# Patient Record
Sex: Female | Born: 1985
Health system: Southern US, Community
[De-identification: ages and names within clinical notes are randomized; demographics above are authoritative.]

## PROBLEM LIST (undated history)

## (undated) ENCOUNTER — Inpatient Hospital Stay (HOSPITAL_COMMUNITY): Payer: Self-pay

## (undated) DIAGNOSIS — I10 Essential (primary) hypertension: Secondary | ICD-10-CM

## (undated) DIAGNOSIS — Z789 Other specified health status: Secondary | ICD-10-CM

## (undated) DIAGNOSIS — D649 Anemia, unspecified: Secondary | ICD-10-CM

## (undated) HISTORY — DX: Essential (primary) hypertension: I10

## (undated) HISTORY — DX: Anemia, unspecified: D64.9

---

## 2007-10-17 DIAGNOSIS — O4590 Premature separation of placenta, unspecified, unspecified trimester: Secondary | ICD-10-CM

## 2009-08-08 ENCOUNTER — Emergency Department: Payer: Self-pay | Admitting: Emergency Medicine

## 2009-08-16 HISTORY — PX: OTHER SURGICAL HISTORY: SHX169

## 2010-07-27 ENCOUNTER — Other Ambulatory Visit
Admission: RE | Admit: 2010-07-27 | Discharge: 2010-07-27 | Payer: Self-pay | Source: Home / Self Care | Admitting: Obstetrics & Gynecology

## 2010-11-28 ENCOUNTER — Emergency Department (HOSPITAL_COMMUNITY)
Admission: EM | Admit: 2010-11-28 | Discharge: 2010-11-28 | Disposition: A | Discharge status: Home / Self Care | Payer: BC Managed Care – HMO | Attending: Emergency Medicine | Admitting: Emergency Medicine

## 2010-11-28 DIAGNOSIS — H9209 Otalgia, unspecified ear: Secondary | ICD-10-CM | POA: Insufficient documentation

## 2010-11-28 DIAGNOSIS — H6500 Acute serous otitis media, unspecified ear: Secondary | ICD-10-CM | POA: Insufficient documentation

## 2011-01-09 ENCOUNTER — Inpatient Hospital Stay (HOSPITAL_COMMUNITY)
Admission: AD | Admit: 2011-01-09 | Discharge: 2011-01-11 | DRG: 373 | Disposition: A | Discharge status: Home / Self Care | Payer: BC Managed Care – HMO | Source: Ambulatory Visit | Attending: Obstetrics and Gynecology | Admitting: Obstetrics and Gynecology

## 2011-01-09 DIAGNOSIS — O34219 Maternal care for unspecified type scar from previous cesarean delivery: Secondary | ICD-10-CM

## 2011-01-09 LAB — URINALYSIS, ROUTINE W REFLEX MICROSCOPIC
Glucose, UA: NEGATIVE mg/dL
Ketones, ur: NEGATIVE mg/dL
Leukocytes, UA: NEGATIVE
Nitrite: NEGATIVE
Specific Gravity, Urine: >1.03 — ABNORMAL HIGH (ref 1.005–1.030)
pH: 6 (ref 5.0–8.0)

## 2011-01-09 LAB — PROTEIN / CREATININE RATIO, URINE
Creatinine, Urine: 299.98 mg/dL
Protein Creatinine Ratio: 0.66 — ABNORMAL HIGH (ref 0.00–0.15)

## 2011-01-09 LAB — CBC
Hemoglobin: 8.9 g/dL — ABNORMAL LOW (ref 12.0–15.0)
MCH: 20.7 pg — ABNORMAL LOW (ref 26.0–34.0)
MCHC: 31.6 g/dL (ref 30.0–36.0)
RDW: 19.7 % — ABNORMAL HIGH (ref 11.5–15.5)

## 2011-01-09 LAB — COMPREHENSIVE METABOLIC PANEL
ALT: 8 U/L (ref 0–35)
Albumin: 2.6 g/dL — ABNORMAL LOW (ref 3.5–5.2)
BUN: 7 mg/dL (ref 6–23)
Calcium: 9.4 mg/dL (ref 8.4–10.5)
GFR calc Af Amer: >60 mL/min (ref >60)
Glucose, Bld: 83 mg/dL (ref 70–99)
Sodium: 130 mEq/L — ABNORMAL LOW (ref 135–145)
Total Protein: 6.8 g/dL (ref 6.0–8.3)

## 2011-01-09 LAB — RPR: RPR Ser Ql: NONREACTIVE

## 2011-01-09 LAB — URINE MICROSCOPIC-ADD ON

## 2011-01-10 LAB — CBC
MCHC: 31.2 g/dL (ref 30.0–36.0)
RDW: 19.7 % — ABNORMAL HIGH (ref 11.5–15.5)
WBC: 11.7 10*3/uL — ABNORMAL HIGH (ref 4.0–10.5)

## 2012-03-14 LAB — OB RESULTS CONSOLE RPR: RPR: NONREACTIVE

## 2012-03-14 LAB — OB RESULTS CONSOLE ABO/RH

## 2012-03-14 LAB — OB RESULTS CONSOLE ANTIBODY SCREEN: Antibody Screen: NEGATIVE

## 2012-07-12 NOTE — L&D Delivery Note (Signed)
Attestation of Attending Supervision of Advanced Practitioner (CNM/NP): Evaluation and management procedures were performed by the Advanced Practitioner under my supervision and collaboration.  I have reviewed the Advanced Practitioner's note and chart, and I agree with the management and plan.  HARRAWAY-SMITH, Jia Dottavio 9:04 AM     

## 2012-07-12 NOTE — L&D Delivery Note (Signed)
Delivery Note At 8:57 PM a viable female was delivered via Vaginal, Spontaneous Delivery (Presentation: Left Occiput Anterior).  APGAR: 9, 9; weight: pending.   Placenta status: Intact, Spontaneous.  Cord: 3 vessels with the following complications: None.    Anesthesia: Epidural  Episiotomy: None Lacerations: None  Est. Blood Loss (mL): 200  Mom to postpartum.  Baby to nursery-stable.  MERRELL, DAVID, MD Family Medicine Resident PGY-2 10/17/2012, 9:43 PM  I have seen this patient, was present for delivery, and I agree with the above. Cam Hai 12:22 AM 10/18/2012

## 2012-08-11 LAB — OB RESULTS CONSOLE RPR: RPR: NONREACTIVE

## 2012-08-11 LAB — OB RESULTS CONSOLE HGB/HCT, BLOOD
HCT: 33 %
Hemoglobin: 11.1 g/dL

## 2012-09-25 ENCOUNTER — Encounter: Payer: Self-pay | Admitting: *Deleted

## 2012-09-27 ENCOUNTER — Ambulatory Visit (INDEPENDENT_AMBULATORY_CARE_PROVIDER_SITE_OTHER): Payer: BC Managed Care – PPO | Admitting: Obstetrics & Gynecology

## 2012-09-27 VITALS — BP 118/80 | Wt 242.0 lb

## 2012-09-27 DIAGNOSIS — O34219 Maternal care for unspecified type scar from previous cesarean delivery: Secondary | ICD-10-CM

## 2012-09-27 DIAGNOSIS — Z331 Pregnant state, incidental: Secondary | ICD-10-CM

## 2012-09-27 DIAGNOSIS — Z3490 Encounter for supervision of normal pregnancy, unspecified, unspecified trimester: Secondary | ICD-10-CM

## 2012-09-27 DIAGNOSIS — Z1389 Encounter for screening for other disorder: Secondary | ICD-10-CM

## 2012-09-27 LAB — POCT URINALYSIS DIPSTICK
Blood, UA: NEGATIVE
Glucose, UA: NEGATIVE
Leukocytes, UA: NEGATIVE
Nitrite, UA: NEGATIVE

## 2012-09-27 LAB — OB RESULTS CONSOLE GBS: GBS: NEGATIVE

## 2012-09-27 NOTE — Progress Notes (Signed)
Patient reports good fetal movement, denies any bleeding and no rupture of membranes symptoms.  Contractions are mild.

## 2012-09-27 NOTE — Progress Notes (Signed)
Pt c/o a lot of lower abd. Pain and pressure.

## 2012-09-27 NOTE — Patient Instructions (Signed)
Pregnancy - Third Trimester  The third trimester of pregnancy (the last 3 months) is a period of the most rapid growth for you and your baby. The baby approaches a length of 20 inches and a weight of 6 to 10 pounds. The baby is adding on fat and getting ready for life outside your body. While inside, babies have periods of sleeping and waking, suck their thumbs, and hiccups. You can often feel small contractions of the uterus. This is false labor. It is also called Braxton-Hicks contractions. This is like a practice for labor. The usual problems in this stage of pregnancy include more difficulty breathing, swelling of the hands and feet from water retention, and having to urinate more often because of the uterus and baby pressing on your bladder.   PRENATAL EXAMS  · Blood work may continue to be done during prenatal exams. These tests are done to check on your health and the probable health of your baby. Blood work is used to follow your blood levels (hemoglobin). Anemia (low hemoglobin) is common during pregnancy. Iron and vitamins are given to help prevent this. You may also continue to be checked for diabetes. Some of the past blood tests may be done again.  · The size of the uterus is measured during each visit. This makes sure your baby is growing properly according to your pregnancy dates.  · Your blood pressure is checked every prenatal visit. This is to make sure you are not getting toxemia.  · Your urine is checked every prenatal visit for infection, diabetes and protein.  · Your weight is checked at each visit. This is done to make sure gains are happening at the suggested rate and that you and your baby are growing normally.  · Sometimes, an ultrasound is performed to confirm the position and the proper growth and development of the baby. This is a test done that bounces harmless sound waves off the baby so your caregiver can more accurately determine due dates.  · Discuss the type of pain medication and  anesthesia you will have during your labor and delivery.  · Discuss the possibility and anesthesia if a Cesarean Section might be necessary.  · Inform your caregiver if there is any mental or physical violence at home.  Sometimes, a specialized non-stress test, contraction stress test and biophysical profile are done to make sure the baby is not having a problem. Checking the amniotic fluid surrounding the baby is called an amniocentesis. The amniotic fluid is removed by sticking a needle into the belly (abdomen). This is sometimes done near the end of pregnancy if an early delivery is required. In this case, it is done to help make sure the baby's lungs are mature enough for the baby to live outside of the womb. If the lungs are not mature and it is unsafe to deliver the baby, an injection of cortisone medication is given to the mother 1 to 2 days before the delivery. This helps the baby's lungs mature and makes it safer to deliver the baby.  CHANGES OCCURING IN THE THIRD TRIMESTER OF PREGNANCY  Your body goes through many changes during pregnancy. They vary from person to person. Talk to your caregiver about changes you notice and are concerned about.  · During the last trimester, you have probably had an increase in your appetite. It is normal to have cravings for certain foods. This varies from person to person and pregnancy to pregnancy.  · You may begin to   get stretch marks on your hips, abdomen, and breasts. These are normal changes in the body during pregnancy. There are no exercises or medications to take which prevent this change.  · Constipation may be treated with a stool softener or adding bulk to your diet. Drinking lots of fluids, fiber in vegetables, fruits, and whole grains are helpful.  · Exercising is also helpful. If you have been very active up until your pregnancy, most of these activities can be continued during your pregnancy. If you have been less active, it is helpful to start an exercise  program such as walking. Consult your caregiver before starting exercise programs.  · Avoid all smoking, alcohol, un-prescribed drugs, herbs and "street drugs" during your pregnancy. These chemicals affect the formation and growth of the baby. Avoid chemicals throughout the pregnancy to ensure the delivery of a healthy infant.  · Backache, varicose veins and hemorrhoids may develop or get worse.  · You will tire more easily in the third trimester, which is normal.  · The baby's movements may be stronger and more often.  · You may become short of breath easily.  · Your belly button may stick out.  · A yellow discharge may leak from your breasts called colostrum.  · You may have a bloody mucus discharge. This usually occurs a few days to a week before labor begins.  HOME CARE INSTRUCTIONS   · Keep your caregiver's appointments. Follow your caregiver's instructions regarding medication use, exercise, and diet.  · During pregnancy, you are providing food for you and your baby. Continue to eat regular, well-balanced meals. Choose foods such as meat, fish, milk and other low fat dairy products, vegetables, fruits, and whole-grain breads and cereals. Your caregiver will tell you of the ideal weight gain.  · A physical sexual relationship may be continued throughout pregnancy if there are no other problems such as early (premature) leaking of amniotic fluid from the membranes, vaginal bleeding, or belly (abdominal) pain.  · Exercise regularly if there are no restrictions. Check with your caregiver if you are unsure of the safety of your exercises. Greater weight gain will occur in the last 2 trimesters of pregnancy. Exercising helps:  · Control your weight.  · Get you in shape for labor and delivery.  · You lose weight after you deliver.  · Rest a lot with legs elevated, or as needed for leg cramps or low back pain.  · Wear a good support or jogging bra for breast tenderness during pregnancy. This may help if worn during  sleep. Pads or tissues may be used in the bra if you are leaking colostrum.  · Do not use hot tubs, steam rooms, or saunas.  · Wear your seat belt when driving. This protects you and your baby if you are in an accident.  · Avoid raw meat, cat litter boxes and soil used by cats. These carry germs that can cause birth defects in the baby.  · It is easier to loose urine during pregnancy. Tightening up and strengthening the pelvic muscles will help with this problem. You can practice stopping your urination while you are going to the bathroom. These are the same muscles you need to strengthen. It is also the muscles you would use if you were trying to stop from passing gas. You can practice tightening these muscles up 10 times a set and repeating this about 3 times per day. Once you know what muscles to tighten up, do not perform these   exercises during urination. It is more likely to cause an infection by backing up the urine.  · Ask for help if you have financial, counseling or nutritional needs during pregnancy. Your caregiver will be able to offer counseling for these needs as well as refer you for other special needs.  · Make a list of emergency phone numbers and have them available.  · Plan on getting help from family or friends when you go home from the hospital.  · Make a trial run to the hospital.  · Take prenatal classes with the father to understand, practice and ask questions about the labor and delivery.  · Prepare the baby's room/nursery.  · Do not travel out of the city unless it is absolutely necessary and with the advice of your caregiver.  · Wear only low or no heal shoes to have better balance and prevent falling.  MEDICATIONS AND DRUG USE IN PREGNANCY  · Take prenatal vitamins as directed. The vitamin should contain 1 milligram of folic acid. Keep all vitamins out of reach of children. Only a couple vitamins or tablets containing iron may be fatal to a baby or young child when ingested.  · Avoid use  of all medications, including herbs, over-the-counter medications, not prescribed or suggested by your caregiver. Only take over-the-counter or prescription medicines for pain, discomfort, or fever as directed by your caregiver. Do not use aspirin, ibuprofen (Motrin®, Advil®, Nuprin®) or naproxen (Aleve®) unless OK'd by your caregiver.  · Let your caregiver also know about herbs you may be using.  · Alcohol is related to a number of birth defects. This includes fetal alcohol syndrome. All alcohol, in any form, should be avoided completely. Smoking will cause low birth rate and premature babies.  · Street/illegal drugs are very harmful to the baby. They are absolutely forbidden. A baby born to an addicted mother will be addicted at birth. The baby will go through the same withdrawal an adult does.  SEEK MEDICAL CARE IF:  You have any concerns or worries during your pregnancy. It is better to call with your questions if you feel they cannot wait, rather than worry about them.  DECISIONS ABOUT CIRCUMCISION  You may or may not know the sex of your baby. If you know your baby is a boy, it may be time to think about circumcision. Circumcision is the removal of the foreskin of the penis. This is the skin that covers the sensitive end of the penis. There is no proven medical need for this. Often this decision is made on what is popular at the time or based upon religious beliefs and social issues. You can discuss these issues with your caregiver or pediatrician.  SEEK IMMEDIATE MEDICAL CARE IF:   · An unexplained oral temperature above 102° F (38.9° C) develops, or as your caregiver suggests.  · You have leaking of fluid from the vagina (birth canal). If leaking membranes are suspected, take your temperature and tell your caregiver of this when you call.  · There is vaginal spotting, bleeding or passing clots. Tell your caregiver of the amount and how many pads are used.  · You develop a bad smelling vaginal discharge with  a change in the color from clear to white.  · You develop vomiting that lasts more than 24 hours.  · You develop chills or fever.  · You develop shortness of breath.  · You develop burning on urination.  · You loose more than 2 pounds of weight   or gain more than 2 pounds of weight or as suggested by your caregiver.  · You notice sudden swelling of your face, hands, and feet or legs.  · You develop belly (abdominal) pain. Round ligament discomfort is a common non-cancerous (benign) cause of abdominal pain in pregnancy. Your caregiver still must evaluate you.  · You develop a severe headache that does not go away.  · You develop visual problems, blurred or double vision.  · If you have not felt your baby move for more than 1 hour. If you think the baby is not moving as much as usual, eat something with sugar in it and lie down on your left side for an hour. The baby should move at least 4 to 5 times per hour. Call right away if your baby moves less than that.  · You fall, are in a car accident or any kind of trauma.  · There is mental or physical violence at home.  Document Released: 06/22/2001 Document Revised: 09/20/2011 Document Reviewed: 12/25/2008  [employer]® Patient Information ©2013 [employer].

## 2012-09-28 ENCOUNTER — Encounter (HOSPITAL_COMMUNITY): Payer: Self-pay | Admitting: *Deleted

## 2012-09-28 ENCOUNTER — Inpatient Hospital Stay (HOSPITAL_COMMUNITY)
Admission: AD | Admit: 2012-09-28 | Discharge: 2012-09-28 | Disposition: A | Discharge status: Home / Self Care | Payer: BC Managed Care – PPO | Source: Ambulatory Visit | Attending: Obstetrics & Gynecology | Admitting: Obstetrics & Gynecology

## 2012-09-28 ENCOUNTER — Encounter: Payer: Self-pay | Admitting: Obstetrics & Gynecology

## 2012-09-28 DIAGNOSIS — O479 False labor, unspecified: Secondary | ICD-10-CM

## 2012-09-28 DIAGNOSIS — O471 False labor at or after 37 completed weeks of gestation: Secondary | ICD-10-CM

## 2012-09-28 DIAGNOSIS — N390 Urinary tract infection, site not specified: Secondary | ICD-10-CM | POA: Insufficient documentation

## 2012-09-28 DIAGNOSIS — O2390 Unspecified genitourinary tract infection in pregnancy, unspecified trimester: Secondary | ICD-10-CM | POA: Insufficient documentation

## 2012-09-28 HISTORY — DX: Other specified health status: Z78.9

## 2012-09-28 NOTE — MAU Note (Signed)
Pt states she has been having irregular U/C's since last night.  But the vaginal pressure has increased since doctors visit yesterday.  Denies vaginal bleeding.  Good fetal movement.

## 2012-09-28 NOTE — MAU Provider Note (Signed)
  History     CSN: [id number]  Arrival date and time: 09/28/12 1342   First Provider Initiated Contact with Patient 09/28/12 1420      Chief Complaint  Patient presents with  . Labor Eval   HPI 27 y/o [id number] here at 27.2 for a labor check. She has been having braxton hicks contractions for a while which became more regular 1 day ago. She began having different stronger contractions yesterday while she was walking which have continued today but are not regular. She denies vaginal LOF/Discharge/bleeding and decreased fetal movement. She is currently on macrobid for a UTI. She describes that she has had some perineal sensitivity for 2 months, describing that she is just a little tender when she wipes or washes in the shower. She denies headaches, scotoma, chest pain, RUQ pain, dysuria, and swelling.   OB History   Grav Para Term Preterm Abortions TAB SAB Ect Mult Living   3 2 1 1      2       Past Medical History  Diagnosis Date  . Medical history non-contributory     Past Surgical History  Procedure Laterality Date  . Cesarean section    . Left ovary and tube removed  2011    Family History  Problem Relation Age of Onset  . Diabetes Other     relation unspecified  . Hypertension Other     relation unspecified  . Cancer Other     breast and ovarian ca. relation unspecified    History  Substance Use Topics  . Smoking status: Never Smoker   . Smokeless tobacco: Never Used  . Alcohol Use: No    Allergies: No Known Allergies  Prescriptions prior to admission  Medication Sig Dispense Refill  . Prenatal Vitamins (DIS) TABS Take 1 tablet by mouth daily.        ROS per HPI Physical Exam   Blood pressure 135/81, pulse 101, temperature 98.1 F (36.7 C), temperature source Oral, resp. rate 18, height 5\' 5"  (1.651 m), weight 110.224 kg (243 lb), last menstrual period 01/01/2012, SpO2 100.00%.  Physical Exam  Gen: NAD, alert, cooperative with exam HEENT: NCAT CV:  RRR, good S1/S2, no murmur Resp: CTABL, no wheezes, non-labored Abd: Soft, non tender, pregnant abdomen Ext: No edema Neuro: Alert and oriented, No gross deficits  Dilation: 2.5 Effacement (%): 30 Cervical Position: Posterior Station: -2 Presentation: Vertex Exam by:: Merrilee Seashore, MD  Dilation: 2.5 Effacement (%): 30 Cervical Position: Posterior Station: -2 Presentation: Vertex Exam by::  (L. Paschal, RN)  FHT: baseline 140, moderate variability, accels present, no decels Toco: occasional, irregular 10+ minutes apart MAU Course  Procedures  No results found for this or any previous visit (from the past 24 hour(s)).   Assessment and Plan  27 y/o [id number] here at 27.2 here for a loabor check - No cervical change after 2+ hours, contractions very irregular - Signs of labor reviewed - [facility], return for labor or ROM  Kevin Fenton 09/28/2012, 2:29 PM  Evaluation and management procedures were performed by Resident physician under my supervision/collaboration. Chart reviewed, patient examined by me and I agree with management and plan.

## 2012-10-02 ENCOUNTER — Encounter (HOSPITAL_COMMUNITY): Payer: Self-pay | Admitting: *Deleted

## 2012-10-02 ENCOUNTER — Inpatient Hospital Stay (HOSPITAL_COMMUNITY)
Admission: AD | Admit: 2012-10-02 | Discharge: 2012-10-02 | Disposition: A | Discharge status: Home / Self Care | Payer: BC Managed Care – PPO | Source: Ambulatory Visit | Attending: Obstetrics and Gynecology | Admitting: Obstetrics and Gynecology

## 2012-10-02 DIAGNOSIS — O471 False labor at or after 37 completed weeks of gestation: Secondary | ICD-10-CM

## 2012-10-02 DIAGNOSIS — O479 False labor, unspecified: Secondary | ICD-10-CM | POA: Insufficient documentation

## 2012-10-02 DIAGNOSIS — O34219 Maternal care for unspecified type scar from previous cesarean delivery: Secondary | ICD-10-CM | POA: Insufficient documentation

## 2012-10-02 NOTE — MAU Note (Signed)
Pt states the contractions were 3-5 min apart for the past hr.  Denies any bleeding or leaking

## 2012-10-02 NOTE — MAU Provider Note (Signed)
  History     CSN: [id number]  Arrival date and time: 10/02/12 0106   None     No chief complaint on file.  HPI 27 y.o. [id number] at [redacted]w[redacted]d presenting with ctx since 11 PM, worsening in intensity about an hour later. No loss of fluid or bleeding, baby moving well. Better when lying down.   Gets prenatal care at [facility], last seen 3/19. Seen here in MAU on 3/20. Was dilated to 2.5-3 at that time. No complications of this pregnancy.   1st delivery placental abruption at 33 weeks - emergency c-section. Second pregnancy at 40.4 days.  OB History   Grav Para Term Preterm Abortions TAB SAB Ect Mult Living   3 2 1 1      2       Past Medical History  Diagnosis Date  . Medical history non-contributory     Past Surgical History  Procedure Laterality Date  . Cesarean section    . Left ovary and tube removed  2011  Wisdom teeth  Family History  Problem Relation Age of Onset  . Diabetes Other     relation unspecified  . Hypertension Other     relation unspecified  . Cancer Other     breast and ovarian ca. relation unspecified    History  Substance Use Topics  . Smoking status: Never Smoker   . Smokeless tobacco: Never Used  . Alcohol Use: No    Allergies: No Known Allergies  Prescriptions prior to admission  Medication Sig Dispense Refill  . nitrofurantoin, macrocrystal-monohydrate, (MACROBID) 100 MG capsule Take 100 mg by mouth 2 (two) times daily. X 5 days. Started on 09/25/12.        Review of Systems  Constitutional: Negative for fever and chills.  Eyes: Negative for blurred vision.  Gastrointestinal: Negative for nausea, vomiting, diarrhea and constipation.  Genitourinary: Negative for dysuria.  Neurological: Negative for dizziness and headaches.   Physical Exam   Blood pressure 118/73, pulse 96, temperature 97.7 F (36.5 C), height 5\' 5"  (1.651 m), weight 111.585 kg (246 lb), last menstrual period 01/01/2012.  Physical Exam  Constitutional: She is  oriented to person, place, and time. She appears well-developed and well-nourished.  HENT:  Head: Normocephalic and atraumatic.  Eyes: Conjunctivae and EOM are normal.  Neck: Normal range of motion. Neck supple.  Cardiovascular: Normal rate, regular rhythm and normal heart sounds.   Respiratory: Effort normal and breath sounds normal. No respiratory distress.  GI: Soft. Bowel sounds are normal. There is no tenderness. There is no rebound and no guarding.  Genitourinary:  Normal external genitalia, normal vagina, minimal white discharge. No CMT. Cervix 2.5/80/-3  Musculoskeletal: Normal range of motion. She exhibits edema (mild lower extremity). She exhibits no tenderness.  Neurological: She is alert and oriented to person, place, and time.  Skin: Skin is warm and dry.  Psychiatric: She has a normal mood and affect.    FHTs:  135, mod var, accels present, occasional early decel   MAU Course  Procedures  After 2 hours, cervix rechecked:  unchanged  Assessment and Plan  27 y.o. [id number] at [redacted]w[redacted]d with contractions - No cervical change. Not in labor. - Labor precautions reviewed - FHTs reactive  Napoleon Form 10/02/2012, 2:20 AM

## 2012-10-03 NOTE — MAU Provider Note (Signed)
Attestation of Attending Supervision of Advanced Practitioner: Evaluation and management procedures were performed by the PA/NP/CNM/OB Fellow under my supervision/collaboration. Chart reviewed and agree with management and plan.  Melony Tenpas V 10/03/2012 9:04 PM   

## 2012-10-05 ENCOUNTER — Encounter: Payer: BC Managed Care – PPO | Admitting: Advanced Practice Midwife

## 2012-10-05 ENCOUNTER — Ambulatory Visit (INDEPENDENT_AMBULATORY_CARE_PROVIDER_SITE_OTHER): Payer: BC Managed Care – PPO | Admitting: Obstetrics & Gynecology

## 2012-10-05 VITALS — BP 110/82 | Wt 242.0 lb

## 2012-10-05 DIAGNOSIS — Z1389 Encounter for screening for other disorder: Secondary | ICD-10-CM

## 2012-10-05 DIAGNOSIS — Z3483 Encounter for supervision of other normal pregnancy, third trimester: Secondary | ICD-10-CM

## 2012-10-05 DIAGNOSIS — Z331 Pregnant state, incidental: Secondary | ICD-10-CM

## 2012-10-05 DIAGNOSIS — O34219 Maternal care for unspecified type scar from previous cesarean delivery: Secondary | ICD-10-CM

## 2012-10-05 LAB — POCT URINALYSIS DIPSTICK: Protein, UA: NEGATIVE

## 2012-10-05 NOTE — Progress Notes (Signed)
Pt reports irregular uterine contractions, no bleeding no gushes of fluid . No edema abdominal exam benign

## 2012-10-05 NOTE — Patient Instructions (Signed)
Contraception Choices  Contraception (birth control) is the use of any methods or devices to prevent pregnancy. Below are some methods to help avoid pregnancy.  HORMONAL METHODS   · Contraceptive implant. This is a thin, plastic tube containing progesterone hormone. It does not contain estrogen hormone. Your caregiver inserts the tube in the inner part of the upper arm. The tube can remain in place for up to 3 years. After 3 years, the implant must be removed. The implant prevents the ovaries from releasing an egg (ovulation), thickens the cervical mucus which prevents sperm from entering the uterus, and thins the lining of the inside of the uterus.  · Progesterone-only injections. These injections are given every 3 months by your caregiver to prevent pregnancy. This synthetic progesterone hormone stops the ovaries from releasing eggs. It also thickens cervical mucus and changes the uterine lining. This makes it harder for sperm to survive in the uterus.  · Birth control pills. These pills contain estrogen and progesterone hormone. They work by stopping the egg from forming in the ovary (ovulation). Birth control pills are prescribed by a caregiver. Birth control pills can also be used to treat heavy periods.  · Minipill. This type of birth control pill contains only the progesterone hormone. They are taken every day of each month and must be prescribed by your caregiver.  · Birth control patch. The patch contains hormones similar to those in birth control pills. It must be changed once a week and is prescribed by a caregiver.  · Vaginal ring. The ring contains hormones similar to those in birth control pills. It is left in the vagina for 3 weeks, removed for 1 week, and then a new one is put back in place. The patient must be comfortable inserting and removing the ring from the vagina. A caregiver's prescription is necessary.  · Emergency contraception. Emergency contraceptives prevent pregnancy after unprotected  sexual intercourse. This pill can be taken right after sex or up to 5 days after unprotected sex. It is most effective the sooner you take the pills after having sexual intercourse. Emergency contraceptive pills are available without a prescription. Check with your pharmacist. Do not use emergency contraception as your only form of birth control.  BARRIER METHODS   · Female condom. This is a thin sheath (latex or rubber) that is worn over the penis during sexual intercourse. It can be used with spermicide to increase effectiveness.  · Female condom. This is a soft, loose-fitting sheath that is put into the vagina before sexual intercourse.  · Diaphragm. This is a soft, latex, dome-shaped barrier that must be fitted by a caregiver. It is inserted into the vagina, along with a spermicidal jelly. It is inserted before intercourse. The diaphragm should be left in the vagina for 6 to 8 hours after intercourse.  · Cervical cap. This is a round, soft, latex or plastic cup that fits over the cervix and must be fitted by a caregiver. The cap can be left in place for up to 48 hours after intercourse.  · Sponge. This is a soft, circular piece of polyurethane foam. The sponge has spermicide in it. It is inserted into the vagina after wetting it and before sexual intercourse.  · Spermicides. These are chemicals that kill or block sperm from entering the cervix and uterus. They come in the form of creams, jellies, suppositories, foam, or tablets. They do not require a prescription. They are inserted into the vagina with an applicator before having sexual intercourse.   The process must be repeated every time you have sexual intercourse.  INTRAUTERINE CONTRACEPTION  · Intrauterine device (IUD). This is a T-shaped device that is put in a woman's uterus during a menstrual period to prevent pregnancy. There are 2 types:  · Copper IUD. This type of IUD is wrapped in copper wire and is placed inside the uterus. Copper makes the uterus and  fallopian tubes produce a fluid that kills sperm. It can stay in place for 10 years.  · Hormone IUD. This type of IUD contains the hormone progestin (synthetic progesterone). The hormone thickens the cervical mucus and prevents sperm from entering the uterus, and it also thins the uterine lining to prevent implantation of a fertilized egg. The hormone can weaken or kill the sperm that get into the uterus. It can stay in place for 5 years.  PERMANENT METHODS OF CONTRACEPTION  · Female tubal ligation. This is when the woman's fallopian tubes are surgically sealed, tied, or blocked to prevent the egg from traveling to the uterus.  · Female sterilization. This is when the female has the tubes that carry sperm tied off (vasectomy). This blocks sperm from entering the vagina during sexual intercourse. After the procedure, the man can still ejaculate fluid (semen).  NATURAL PLANNING METHODS  · Natural family planning. This is not having sexual intercourse or using a barrier method (condom, diaphragm, cervical cap) on days the woman could become pregnant.  · Calendar method. This is keeping track of the length of each menstrual cycle and identifying when you are fertile.  · Ovulation method. This is avoiding sexual intercourse during ovulation.  · Symptothermal method. This is avoiding sexual intercourse during ovulation, using a thermometer and ovulation symptoms.  · Post-ovulation method. This is timing sexual intercourse after you have ovulated.  Regardless of which type or method of contraception you choose, it is important that you use condoms to protect against the transmission of sexually transmitted diseases (STDs). Talk with your caregiver about which form of contraception is most appropriate for you.  Document Released: 06/28/2005 Document Revised: 09/20/2011 Document Reviewed: 11/04/2010  [employer]® Patient Information ©2013 [employer].

## 2012-10-12 ENCOUNTER — Encounter: Payer: BC Managed Care – PPO | Admitting: Obstetrics & Gynecology

## 2012-10-13 ENCOUNTER — Telehealth (HOSPITAL_COMMUNITY): Payer: Self-pay | Admitting: *Deleted

## 2012-10-13 NOTE — Telephone Encounter (Signed)
Preadmission screen  

## 2012-10-16 ENCOUNTER — Ambulatory Visit (INDEPENDENT_AMBULATORY_CARE_PROVIDER_SITE_OTHER): Payer: BC Managed Care – PPO | Admitting: Women's Health

## 2012-10-16 ENCOUNTER — Telehealth (HOSPITAL_COMMUNITY): Payer: Self-pay | Admitting: *Deleted

## 2012-10-16 ENCOUNTER — Encounter: Payer: Self-pay | Admitting: Women's Health

## 2012-10-16 VITALS — BP 120/70 | Wt 249.0 lb

## 2012-10-16 DIAGNOSIS — Z1389 Encounter for screening for other disorder: Secondary | ICD-10-CM

## 2012-10-16 DIAGNOSIS — Z3480 Encounter for supervision of other normal pregnancy, unspecified trimester: Secondary | ICD-10-CM | POA: Insufficient documentation

## 2012-10-16 DIAGNOSIS — Z331 Pregnant state, incidental: Secondary | ICD-10-CM

## 2012-10-16 DIAGNOSIS — Z3483 Encounter for supervision of other normal pregnancy, third trimester: Secondary | ICD-10-CM

## 2012-10-16 DIAGNOSIS — O34219 Maternal care for unspecified type scar from previous cesarean delivery: Secondary | ICD-10-CM

## 2012-10-16 LAB — POCT URINALYSIS DIPSTICK
Ketones, UA: NEGATIVE
Protein, UA: NEGATIVE

## 2012-10-16 NOTE — Telephone Encounter (Signed)
Preadmission screen  

## 2012-10-16 NOTE — Assessment & Plan Note (Addendum)
Wants VBAC, has had 1 previous successful VBAC. Consent signed: 07/04/12

## 2012-10-16 NOTE — Progress Notes (Signed)
Reports good fm and irregular uc's. Denies lof or vb.  Requests membranes be swept- discussed r/b- pt decided to proceed. Membranes swept.  Reviewed labor s/s, when to go to hospital. Still desires VBAC. IOL scheduled for 10/24/12 @ 0700 if still pregnant.  Begin 2x/wk antenatal testing this Thursday. Marge Duncans 10/16/2012 10:17 AM

## 2012-10-16 NOTE — Patient Instructions (Addendum)
Vaginal Birth After Cesarean Delivery Vaginal birth after Cesarean delivery (VBAC) is giving birth vaginally after previously delivering a baby by a cesarean. In the past, if a woman had a Cesarean delivery, all births afterwards would be done by Cesarean delivery. This is no longer true. It can be safe for the mother to try a vaginal delivery after having a Cesarean. The final decision to have a VBAC or repeat Cesarean delivery should be between the patient and her caregiver. The risks and benefits can be discussed relative to the reason for, and the type of the previous Cesarean delivery. WOMEN WHO PLAN TO HAVE A VBAC SHOULD CHECK WITH THEIR DOCTOR TO BE SURE THAT:  The previous Cesarean was done with a low transverse uterine incision (not a vertical classical incision).  The birth canal is big enough for the baby.  There were no other operations on the uterus.  They will have an electronic fetal monitor (EFM) on at all times during labor.  An operating room would be available and ready in case an emergency Cesarean is needed.  A doctor and surgical nursing staff would be available at all times during labor to be ready to do an emergency Cesarean if necessary.  An anesthesiologist would be present in case an emergency Cesarean is needed.  The nursery is prepared and has adequate personnel and necessary equipment available to care for the baby in case of an emergency Cesarean. BENEFITS OF VBAC:  Shorter stay in the hospital.  Lower delivery, nursery and hospital costs.  Less blood loss and need for blood transfusions.  Less fever and discomfort from major surgery.  Lower risk of blood clots.  Lower risk of infection.  Shorter recovery after going home.  Lower risk of other surgical complications, such as opening of the incision or hernia in the incision.  Decreased risk of injury to other organs.  Decreased risk for having to remove the uterus (hysterectomy).  Decreased risk  for the placenta to completely or partially cover the opening of the uterus (placenta previa) with a future pregnancy.  Ability to have a larger family if desired. RISKS OF A VBAC:  Rupture of the uterus.  Having to remove the uterus (hysterectomy) if it ruptures.  All the complications of major surgery and/or injury to other organs.  Excessive bleeding, blood clots and infection.  Lower Apgar scores (method to evaluate the newborn based on appearance, pulse, grimace, activity, and respiration) and more risks to the baby.  There is a higher risk of uterine rupture if you induce or augment labor.  There is a higher risk of uterine rupture if you use medications to ripen the cervix. VBAC SHOULD NOT BE DONE IF:  The previous Cesarean was done with a vertical (classical) or T-shaped incision, or you do not know what kind of an incision was made.  You had a ruptured uterus.  You had surgery on your uterus.  You have medical or obstetrical problems.  There are problems with the baby.  There were two previous Cesarean deliveries and no vaginal deliveries. OTHER FACTS TO KNOW ABOUT VBAC:  It is safe to have an epidural anesthetic with VBAC.  It is safe to turn the baby from a breech position (attempt an external cephalic version).  It is safe to try a VBAC with twins.  Pregnancies later than 40 weeks have not been successful with VBAC.  There is an increased failure rate of a VBAC in obese pregnant women.  There is  an increased failure rate with VABC if the baby weighs 8.8 pounds (4000 grams) or more.  There is an increased failure rate if the time between the Cesarean and VBAC is less than 19 months.  There is an increased failure rate if pre-eclampsia is present (high blood pressure, protein in the urine and swelling of face and extremities).  VBAC is very successful if there was a previous vaginal birth.  VBAC is very successful when the labor starts spontaneously before  the due date.  Delivery of VBAC is similar to having a normal spontaneous vaginal delivery. It is important to discuss VBAC with your caregiver early in the pregnancy so you can understand the risks, benefits and options. It will give you time to decide what is best in your particular case relevant to the reason for your previous Cesarean delivery. It should be understood that medical changes in the mother or pregnancy may occur during the pregnancy, which make it necessary to change you or your caregiver's initial decision. The counseling, concerns and decisions should be documented in the medical record and signed by all parties. Document Released: 12/19/2006 Document Revised: 09/20/2011 Document Reviewed: 08/09/2008 [facility] Patient Information 2013 [place], [place]. Labor Induction  Most women go into labor on their own between 46 and 42 weeks of the pregnancy. When this does not happen or when there is a medical need, medicine or other methods may be used to induce labor. Labor induction causes a pregnant woman's uterus to contract. It also causes the cervix to soften (ripen), open (dilate), and thin out (efface). Usually, labor is not induced before 39 weeks of the pregnancy unless there is a problem with the baby or mother. Whether your labor will be induced depends on a number of factors, including the following:  The medical condition of you and the baby.  How many weeks along you are.  The status of baby's lung maturity.  The condition of the cervix.  The position of the baby. REASONS FOR LABOR INDUCTION  The health of the baby or mother is at risk.  The pregnancy is overdue by 1 week or more.  The water breaks but labor does not start on its own.  The mother has a health condition or serious illness such as high blood pressure, infection, placental abruption, or diabetes.  The amniotic fluid amounts are low around the baby.  The baby is distressed. REASONS TO NOT INDUCE  LABOR Labor induction may not be a good idea if:  It is shown that your baby does not tolerate labor.  An induction is just more convenient.  You want the baby to be born on a certain date, like a holiday.  You have had previous surgeries on your uterus, such as a myomectomy or the removal of fibroids.  Your placenta lies very low in the uterus and blocks the opening of the cervix (placenta previa).  Your baby is not in a head down position.  The umbilical cord drops down into the birth canal in front of the baby. This could cut off the baby's blood and oxygen supply.  You have had a previous cesarean delivery.  There areunusual circumstances, such as the baby being extremely premature. RISKS AND COMPLICATIONS Problems may occur in the process of induction and plans may need to be modified as a situation unfolds. Some of the risks of induction include:  Change in fetal heart rate, such as too high, too low, or erratic.  Risk of fetal distress.  Risk of infection to mother and baby.  Increased chance of having a cesarean delivery.  The rare, but increased chance that the placenta will separate from the uterus (abruption).  Uterine rupture (very rare). When induction is needed for medical reasons, the benefits of induction may outweigh the risks. BEFORE THE PROCEDURE Your caregiver will check your cervix and the baby's position. This will help your caregiver decide if you are far enough along for an induction to work. PROCEDURE Several methods of labor induction may be used, such as:   Taking prostaglandin medicine to dilate and ripen the cervix. The medicine will also start contractions. It can be taken by mouth or by inserting a suppository into the vagina.  A thin tube (catheter) with a balloon on the end may be inserted into your vagina to dilate the cervix. Once inserted, the balloon expands with water, which causes the cervix to open.  Striping the membranes. Your  caregiver inserts a finger between the cervix and membranes, which causes the cervix to be stretched and may cause the uterus to contract. This is often done during an office visit. You will be sent home to wait for the contractions to begin. You will then come in for an induction.  Breaking the water. Your caregiver will make a hole in the amniotic sac using a small instrument. Once the amniotic sac breaks, contractions should begin. This may still take hours to see an effect.  Taking medicine to trigger or strengthen contractions. This medicine is given intravenously through a tube in your arm. All of the methods of induction, besides stripping the membranes, will be done in the hospital. Induction is done in the hospital so that you and the baby can be carefully monitored. AFTER THE PROCEDURE Some inductions can take up to 2 or 3 days. Depending on the cervix, it usually takes less time. It takes longer when you are induced early in the pregnancy or if this is your first pregnancy. If a mother is still pregnant and the induction has been going on for 2 to 3 days, either the mother will be sent home or a cesarean delivery will be needed. Document Released: 11/17/2006 Document Revised: 09/20/2011 Document Reviewed: 05/03/2011 [facility] Patient Information 2013 [place], [place].

## 2012-10-17 ENCOUNTER — Inpatient Hospital Stay (HOSPITAL_COMMUNITY)
Admission: AD | Admit: 2012-10-17 | Discharge: 2012-10-19 | DRG: 373 | Disposition: A | Discharge status: Home / Self Care | Payer: BC Managed Care – PPO | Source: Ambulatory Visit | Attending: Obstetrics & Gynecology | Admitting: Obstetrics & Gynecology

## 2012-10-17 ENCOUNTER — Encounter (HOSPITAL_COMMUNITY): Payer: Self-pay | Admitting: Anesthesiology

## 2012-10-17 ENCOUNTER — Inpatient Hospital Stay (HOSPITAL_COMMUNITY): Payer: BC Managed Care – PPO | Admitting: Anesthesiology

## 2012-10-17 ENCOUNTER — Encounter (HOSPITAL_COMMUNITY): Payer: Self-pay | Admitting: *Deleted

## 2012-10-17 DIAGNOSIS — N390 Urinary tract infection, site not specified: Secondary | ICD-10-CM

## 2012-10-17 DIAGNOSIS — O34219 Maternal care for unspecified type scar from previous cesarean delivery: Secondary | ICD-10-CM

## 2012-10-17 DIAGNOSIS — O2390 Unspecified genitourinary tract infection in pregnancy, unspecified trimester: Secondary | ICD-10-CM

## 2012-10-17 DIAGNOSIS — O479 False labor, unspecified: Secondary | ICD-10-CM

## 2012-10-17 LAB — CBC
MCH: 23.3 pg — ABNORMAL LOW (ref 26.0–34.0)
MCV: 71.4 fL — ABNORMAL LOW (ref 78.0–100.0)
Platelets: 257 10*3/uL (ref 150–400)
RDW: 17.4 % — ABNORMAL HIGH (ref 11.5–15.5)

## 2012-10-17 LAB — RPR: RPR Ser Ql: NONREACTIVE

## 2012-10-17 MED ORDER — PHENYLEPHRINE 40 MCG/ML (10ML) SYRINGE FOR IV PUSH (FOR BLOOD PRESSURE SUPPORT)
80.0000 ug | PREFILLED_SYRINGE | INTRAVENOUS | Status: DC | PRN
  Filled 2012-10-17: qty 2

## 2012-10-17 MED ORDER — LACTATED RINGERS IV SOLN
500.0000 mL | INTRAVENOUS | Status: DC | PRN
  Administered 2012-10-17: 500 mL via INTRAVENOUS

## 2012-10-17 MED ORDER — OXYTOCIN 40 UNITS IN LACTATED RINGERS INFUSION - SIMPLE MED
1.0000 m[IU]/min | INTRAVENOUS | Status: DC
  Administered 2012-10-17: 1 m[IU]/min via INTRAVENOUS

## 2012-10-17 MED ORDER — LIDOCAINE HCL (PF) 1 % IJ SOLN
30.0000 mL | INTRAMUSCULAR | Status: DC | PRN
  Filled 2012-10-17 (×2): qty 30

## 2012-10-17 MED ORDER — CITRIC ACID-SODIUM CITRATE 334-500 MG/5ML PO SOLN: 30.0000 mL | ORAL | Status: DC | PRN

## 2012-10-17 MED ORDER — ACETAMINOPHEN 325 MG PO TABS: 650.0000 mg | ORAL_TABLET | ORAL | Status: DC | PRN

## 2012-10-17 MED ORDER — OXYCODONE-ACETAMINOPHEN 5-325 MG PO TABS: 1.0000 | ORAL_TABLET | ORAL | Status: DC | PRN

## 2012-10-17 MED ORDER — FENTANYL CITRATE 0.05 MG/ML IJ SOLN: 100.0000 ug | Freq: Once | INTRAMUSCULAR | Status: DC

## 2012-10-17 MED ORDER — DIPHENHYDRAMINE HCL 50 MG/ML IJ SOLN
12.5000 mg | INTRAMUSCULAR | Status: DC | PRN
  Administered 2012-10-17 (×2): 12.5 mg via INTRAVENOUS
  Filled 2012-10-17 (×2): qty 1

## 2012-10-17 MED ORDER — NALBUPHINE SYRINGE 5 MG/0.5 ML
5.0000 mg | INJECTION | INTRAMUSCULAR | Status: DC | PRN
  Administered 2012-10-17: 5 mg via INTRAVENOUS
  Filled 2012-10-17 (×2): qty 0.5

## 2012-10-17 MED ORDER — FENTANYL 2.5 MCG/ML BUPIVACAINE 1/10 % EPIDURAL INFUSION (WH - ANES)
INTRAMUSCULAR | Status: DC | PRN
  Administered 2012-10-17: 14 mL/h via EPIDURAL

## 2012-10-17 MED ORDER — IBUPROFEN 600 MG PO TABS: 600.0000 mg | ORAL_TABLET | Freq: Four times a day (QID) | ORAL | Status: DC | PRN

## 2012-10-17 MED ORDER — TERBUTALINE SULFATE 1 MG/ML IJ SOLN: 0.2500 mg | Freq: Once | INTRAMUSCULAR | Status: AC | PRN

## 2012-10-17 MED ORDER — EPHEDRINE 5 MG/ML INJ
10.0000 mg | INTRAVENOUS | Status: DC | PRN
  Filled 2012-10-17: qty 2

## 2012-10-17 MED ORDER — LIDOCAINE HCL (PF) 1 % IJ SOLN
INTRAMUSCULAR | Status: DC | PRN
  Administered 2012-10-17 (×2): 4 mL

## 2012-10-17 MED ORDER — EPHEDRINE 5 MG/ML INJ
10.0000 mg | INTRAVENOUS | Status: DC | PRN
  Filled 2012-10-17: qty 2
  Filled 2012-10-17: qty 4

## 2012-10-17 MED ORDER — PHENYLEPHRINE 40 MCG/ML (10ML) SYRINGE FOR IV PUSH (FOR BLOOD PRESSURE SUPPORT)
80.0000 ug | PREFILLED_SYRINGE | INTRAVENOUS | Status: DC | PRN
  Filled 2012-10-17: qty 2
  Filled 2012-10-17: qty 5

## 2012-10-17 MED ORDER — FENTANYL CITRATE 0.05 MG/ML IJ SOLN: 100.0000 ug | INTRAMUSCULAR | Status: DC | PRN

## 2012-10-17 MED ORDER — OXYTOCIN 40 UNITS IN LACTATED RINGERS INFUSION - SIMPLE MED
62.5000 mL/h | INTRAVENOUS | Status: DC
  Filled 2012-10-17: qty 1000

## 2012-10-17 MED ORDER — LACTATED RINGERS IV SOLN
INTRAVENOUS | Status: DC
  Administered 2012-10-17 (×2): via INTRAVENOUS

## 2012-10-17 MED ORDER — LACTATED RINGERS IV SOLN: 500.0000 mL | Freq: Once | INTRAVENOUS | Status: DC

## 2012-10-17 MED ORDER — ONDANSETRON HCL 4 MG/2ML IJ SOLN: 4.0000 mg | Freq: Four times a day (QID) | INTRAMUSCULAR | Status: DC | PRN

## 2012-10-17 MED ORDER — OXYTOCIN BOLUS FROM INFUSION
500.0000 mL | INTRAVENOUS | Status: DC
  Administered 2012-10-17: 500 mL via INTRAVENOUS

## 2012-10-17 MED ORDER — FENTANYL 2.5 MCG/ML BUPIVACAINE 1/10 % EPIDURAL INFUSION (WH - ANES)
14.0000 mL/h | INTRAMUSCULAR | Status: DC | PRN
  Filled 2012-10-17: qty 125

## 2012-10-17 MED ORDER — FENTANYL 2.5 MCG/ML BUPIVACAINE 1/10 % EPIDURAL INFUSION (WH - ANES): 14.0000 mL/h | INTRAMUSCULAR | Status: DC | PRN

## 2012-10-17 NOTE — H&P (Addendum)
I was present for the exam and agree with above. No further leaking.   [place], [place] 10/17/2012 4:04 PM

## 2012-10-17 NOTE — Progress Notes (Signed)
Kim Duran is a 27 y.o. G3P1102 at [redacted]w[redacted]d.   Subjective: Comfortable w/ epidural. Reports generalized itching not resolving w/ Benadryl. No SOB and swelling of tongue or throat.   Objective: BP 121/73  Pulse 78  Temp(Src) 98.1 F (36.7 C) (Oral)  Resp 20  Ht 5\' 3"  (1.6 m)  Wt 111.585 kg (246 lb)  BMI 43.59 kg/m2  SpO2 100%  LMP 01/01/2012     FHT:  FHR: 140 bpm, variability: moderate,  accelerations:  Present,  decelerations:  Absent UC:   regular, every 4-5 minutes, moderate. Pitocin 2 milliUnits /min SVE:   Dilation: 5 Effacement (%): 80 Station: -1 Exam by::Brittany Pollie Meyer, MD IUPC placed w/out difficulty  Labs: Lab Results  Component Value Date   WBC 10.0 10/17/2012   HGB 10.4* 10/17/2012   HCT 31.9* 10/17/2012   MCV 71.4* 10/17/2012   PLT 257 10/17/2012    Assessment / Plan: Augmentation of labor, progressing well  Labor: Progressing slowly. Will increase pitocin to acheive MVUs of 180-240. Preeclampsia:  NA Fetal Wellbeing:  Category I Pain Control:  Epidural I/D:  n/a Anticipated MOD:  NSVD Nubain for itching.  Kim Duran 10/17/2012, 6:31 PM

## 2012-10-17 NOTE — Anesthesia Preprocedure Evaluation (Addendum)
Anesthesia Evaluation  Patient identified by MRN, date of birth, ID band Patient awake    Reviewed: Allergy & Precautions, H&P , Patient's Chart, lab work & pertinent test results  Airway Mallampati: III TM Distance: >3 FB Neck ROM: full    Dental no notable dental hx. (+) Teeth Intact   Pulmonary neg pulmonary ROS,  breath sounds clear to auscultation  Pulmonary exam normal       Cardiovascular negative cardio ROS  Rhythm:regular Rate:Normal     Neuro/Psych negative neurological ROS  negative psych ROS   GI/Hepatic negative GI ROS, Neg liver ROS,   Endo/Other  Morbid obesity  Renal/GU negative Renal ROS  negative genitourinary   Musculoskeletal   Abdominal Normal abdominal exam  (+)   Peds  Hematology negative hematology ROS (+)   Anesthesia Other Findings   Reproductive/Obstetrics (+) Pregnancy Previous C/Section with 1st baby Successful VBAC with 2nd baby                           Anesthesia Physical Anesthesia Plan  ASA: III  Anesthesia Plan: Epidural   Post-op Pain Management:    Induction:   Airway Management Planned:   Additional Equipment:   Intra-op Plan:   Post-operative Plan:   Informed Consent: I have reviewed the patients History and Physical, chart, labs and discussed the procedure including the risks, benefits and alternatives for the proposed anesthesia with the patient or authorized representative who has indicated his/her understanding and acceptance.     Plan Discussed with: Anesthesiologist  Anesthesia Plan Comments:         Anesthesia Quick Evaluation

## 2012-10-17 NOTE — H&P (Signed)
Kim Duran is a 27 y.o. G3P1102 at [redacted]w[redacted]d based on a [redacted]w[redacted]d ultrasound who presents for painful contractions. Reports overnight she began having strong contractions. Yesterday her membranes were stripped and then last night her mucous plug passed. Had a little bit of spotting with after the mucous plug. Is unsure if she has had any fluid leaking, she thought she might have when she was in the shower. Last felt fetal movement one minute ago.  She is having a female baby, who will be named "Kim Duran." Denies having any problems during this pregnancy. She has gotten her prenatal care through [facility]. PNC started at [redacted] weeks gestation. Plans to breast and bottle feed after delivery. Is unsure about contraception but she does plan to have children again in the future. Pediatrician will be [facility].  Pt has hx of c/s with her first pregnancy and desires TOLAC. Hx of one successful VBAC. Consent is scanned into electronic medical record.   History OB History   Grav Para Term Preterm Abortions TAB SAB Ect Mult Living   3 2 1 1      2      Her first pregnancy was in 2009 and was complicated by placental abruption at 33weeks - she had an emergency cesarean delivery at [facility]. She also had early contractions during this pregnancy.  Her second pregnancy was a successful VBAC without complications. That baby weighed <8 lbs.  Past Medical History  Diagnosis Date  . Medical history non-contributory    Past Surgical History  Procedure Laterality Date  . Cesarean section    . Left ovary and tube removed  2011   Family History: family history includes Cancer in her other; Diabetes in her other; and Hypertension in her other. Social History: denies alcohol, tobacco, drug use  Medications: just finished course of macrobid for UTI, also takes prenatal vitamins sometimes  Allergies: denies any food or drug allergies  Prenatal Transfer Tool  Maternal  Diabetes: No Genetic Screening: Normal Maternal Ultrasounds/Referrals: Normal Fetal Ultrasounds or other Referrals:  None Maternal Substance Abuse:  No Significant Maternal Medications:  None Significant Maternal Lab Results:  Lab values include: Group B Strep negative Other Comments:  None  ROS per HPI - endorses some light spotting, ?fluid leak, + painful contractions  Dilation: 4 Effacement (%): 60 Station: -2 Exam by:: S. Carrera, RNC Blood pressure 128/78, pulse 87, temperature 98.2 F (36.8 C), temperature source Oral, resp. rate 20, height 5\' 3"  (1.6 m), weight 246 lb (111.585 kg), last menstrual period 01/01/2012. Exam Physical Exam  Gen: NAD, pleasant, cooperative Heart: RRR Lungs: CTAB, NWOB Abd: gravid, soft, nontender to palpation Ext: no appreciable lower extremity edema Neuro: grossly nonfocal, speech intact GU: see cervical exam above  Prenatal labs: ABO, Rh: B/Positive/-- (09/03 0000) Antibody: Negative (09/03 0000) Rubella: Immune (09/03 0000) RPR: Nonreactive (01/31 0000)  HBsAg: Negative (09/03 0000)  HIV: Non-reactive (01/31 0000)  GBS: Negative (03/19 0000)    FHR: baseline 120s, moderate variability, + accels, no decels Toco: rare contractions  Assessment/Plan: Kim Duran is a 27 y.o. [id number] at [redacted]w[redacted]d who presents painfully contracting, with cervix dilated to 4cm who desires TOLAC. Has history of one successful VBAC.  -Admit to labor and delivery for expectant management -May proceed to augment labor with pitocin if cervix does not change spontaneously -Plans for epidural -Expect VBAC/NSVD.    Levert Feinstein 10/17/2012, 11:05 AM

## 2012-10-17 NOTE — Progress Notes (Signed)
Zianne Schubring is a 27 y.o. G3P1102 at [redacted]w[redacted]d here with SOL for TOLAC  Subjective: Is feeling contractions more painfully now after having her water broken.   Objective: BP 132/82  Pulse 84  Temp(Src) 97.7 F (36.5 C) (Oral)  Resp 20  Ht 5\' 3"  (1.6 m)  Wt 246 lb (111.585 kg)  BMI 43.59 kg/m2  LMP 01/01/2012     FHT:  FHR: 130-140 bpm, variability: moderate,  accelerations:  Present,  decelerations:  Absent UC:   irregular, every 10 minutes SVE:   Dilation: 5 Effacement (%): 80 Station: -2 Exam by:: Clearence Cheek RN  Labs: Lab Results  Component Value Date   WBC 10.0 10/17/2012   HGB 10.4* 10/17/2012   HCT 31.9* 10/17/2012   MCV 71.4* 10/17/2012   PLT 257 10/17/2012    Assessment / Plan: Spontaneous labor, progressing normally in TOLAC  Labor: progressing better s/p AROM, will start low dose pitocin now Preeclampsia:  NA Fetal Wellbeing:  Category I Pain Control:  planning for epidural I/D:  n/a Anticipated MOD:  NSVD  Will plan to place IUPC at next cervical check in 2 hours.  Levert Feinstein 10/17/2012, 4:02 PM

## 2012-10-17 NOTE — Progress Notes (Signed)
Patient walking. Shift report received from D. Meda Coffee, Charity fundraiser.

## 2012-10-17 NOTE — Anesthesia Procedure Notes (Signed)
Epidural Patient location during procedure: OB Start time: 10/17/2012 4:57 PM  Staffing Anesthesiologist: Draylen Lobue A. Performed by: anesthesiologist   Preanesthetic Checklist Completed: patient identified, site marked, surgical consent, pre-op evaluation, timeout performed, IV checked, risks and benefits discussed and monitors and equipment checked  Epidural Patient position: sitting Prep: site prepped and draped and DuraPrep Patient monitoring: continuous pulse ox and blood pressure Approach: midline Injection technique: LOR air  Needle:  Needle type: Tuohy  Needle gauge: 17 G Needle length: 9 cm and 9 Needle insertion depth: 8 cm Catheter type: closed end flexible Catheter size: 19 Gauge Catheter at skin depth: 13 cm Test dose: negative and Other  Assessment Events: blood not aspirated, injection not painful, no injection resistance, negative IV test and no paresthesia  Additional Notes Patient identified. Risks and benefits discussed including failed block, incomplete  Pain control, post dural puncture headache, nerve damage, paralysis, blood pressure Changes, nausea, vomiting, reactions to medications-both toxic and allergic and post Partum back pain. All questions were answered. Patient expressed understanding and wished to proceed. Sterile technique was used throughout procedure. Epidural site was Dressed with sterile barrier dressing. No paresthesias, signs of intravascular injection Or signs of intrathecal spread were encountered.  Patient was more comfortable after the epidural was dosed. Please see RN's note for documentation of vital signs and FHR which are stable.

## 2012-10-17 NOTE — Progress Notes (Signed)
Kim Duran is a 27 y.o. G3P1102 at [redacted]w[redacted]d  Subjective: Very uncomfortable w/ contractions, but reports that they are further apart. Strongly desires augmentation.   Objective: BP 132/77  Pulse 69  Temp(Src) 98.2 F (36.8 C) (Oral)  Resp 20  Ht 5\' 3"  (1.6 m)  Wt 111.585 kg (246 lb)  BMI 43.59 kg/m2  LMP 01/01/2012      FHT:  FHR: 130-140 bpm, variability: moderate,  accelerations:  Present,  decelerations:  Absent UC:   irregular, every 8 minutes, moderate SVE:   Dilation: 4 Effacement (%): 60 Station: -2 Exam by:: [place] Saachi Zale CNM AROM moderate amount of clear fluid.  Labs: Lab Results  Component Value Date   WBC 10.0 10/17/2012   HGB 10.4* 10/17/2012   HCT 31.9* 10/17/2012   MCV 71.4* 10/17/2012   PLT 257 10/17/2012    Assessment / Plan: Protracted latent phase    Labor: No further progress. Preeclampsia:  NA Fetal Wellbeing:  Category I Pain Control:  requesting IV pain meds. I/D:  n/a Anticipated MOD:  NSVD Consulted w/ Dr. Jolayne Panther prior to AROM. Agrees w/ POC. Low dose pit and IUPC PRN of adequate labor not achieved w/ AROM.   [name] 10/17/2012, 1:55 PM

## 2012-10-17 NOTE — MAU Note (Signed)
PT SAYS  VE 3 CM YESTERDAY.   THIS BABY - VBAC.  DENIES HSV, MRSA, SROM.

## 2012-10-17 NOTE — Progress Notes (Signed)
spontaneous vaginal delivery of viable female infant

## 2012-10-18 MED ORDER — DIBUCAINE 1 % RE OINT: 1.0000 "application " | TOPICAL_OINTMENT | RECTAL | Status: DC | PRN

## 2012-10-18 MED ORDER — DIPHENHYDRAMINE HCL 25 MG PO CAPS: 25.0000 mg | ORAL_CAPSULE | Freq: Four times a day (QID) | ORAL | Status: DC | PRN

## 2012-10-18 MED ORDER — IBUPROFEN 600 MG PO TABS
600.0000 mg | ORAL_TABLET | Freq: Four times a day (QID) | ORAL | Status: DC
  Administered 2012-10-18 – 2012-10-19 (×7): 600 mg via ORAL
  Filled 2012-10-18 (×7): qty 1

## 2012-10-18 MED ORDER — ZOLPIDEM TARTRATE 5 MG PO TABS: 5.0000 mg | ORAL_TABLET | Freq: Every evening | ORAL | Status: DC | PRN

## 2012-10-18 MED ORDER — SENNOSIDES-DOCUSATE SODIUM 8.6-50 MG PO TABS: 2.0000 | ORAL_TABLET | Freq: Every day | ORAL | Status: DC

## 2012-10-18 MED ORDER — SIMETHICONE 80 MG PO CHEW: 80.0000 mg | CHEWABLE_TABLET | ORAL | Status: DC | PRN

## 2012-10-18 MED ORDER — ONDANSETRON HCL 4 MG PO TABS: 4.0000 mg | ORAL_TABLET | ORAL | Status: DC | PRN

## 2012-10-18 MED ORDER — ONDANSETRON HCL 4 MG/2ML IJ SOLN: 4.0000 mg | INTRAMUSCULAR | Status: DC | PRN

## 2012-10-18 MED ORDER — TETANUS-DIPHTH-ACELL PERTUSSIS 5-2.5-18.5 LF-MCG/0.5 IM SUSP: 0.5000 mL | Freq: Once | INTRAMUSCULAR | Status: DC

## 2012-10-18 MED ORDER — OXYCODONE-ACETAMINOPHEN 5-325 MG PO TABS
1.0000 | ORAL_TABLET | ORAL | Status: DC | PRN
  Administered 2012-10-18 (×2): 1 via ORAL
  Administered 2012-10-19: 2 via ORAL
  Administered 2012-10-19 (×2): 1 via ORAL
  Filled 2012-10-18 (×4): qty 1
  Filled 2012-10-18: qty 2

## 2012-10-18 MED ORDER — LANOLIN HYDROUS EX OINT: TOPICAL_OINTMENT | CUTANEOUS | Status: DC | PRN

## 2012-10-18 MED ORDER — WITCH HAZEL-GLYCERIN EX PADS: 1.0000 "application " | MEDICATED_PAD | CUTANEOUS | Status: DC | PRN

## 2012-10-18 MED ORDER — BENZOCAINE-MENTHOL 20-0.5 % EX AERO
1.0000 "application " | INHALATION_SPRAY | CUTANEOUS | Status: DC | PRN
  Administered 2012-10-19: 1 via TOPICAL
  Filled 2012-10-18: qty 56

## 2012-10-18 MED ORDER — PRENATAL MULTIVITAMIN CH
1.0000 | ORAL_TABLET | Freq: Every day | ORAL | Status: DC
  Administered 2012-10-18 – 2012-10-19 (×2): 1 via ORAL
  Filled 2012-10-18 (×3): qty 1

## 2012-10-18 NOTE — Progress Notes (Signed)
Patient seen and examined by me. Agree with above assessment and plan. Patient desires an inpatient circumcision which will be performed at some point prior to discharge

## 2012-10-18 NOTE — Progress Notes (Signed)
Post Partum Day 1 Subjective: no complaints, up ad lib, voiding, tolerating PO and + flatus  Objective: Blood pressure 120/58, pulse 81, temperature 98.3 F (36.8 C), temperature source Oral, resp. rate 17, height 5\' 3"  (1.6 m), weight 246 lb (111.585 kg), last menstrual period 01/01/2012, SpO2 100.00%, unknown if currently breastfeeding.  Physical Exam:  General: alert, cooperative and no distress Lochia: appropriate (describes as like a period) Uterine Fundus: soft Incision: n/a DVT Evaluation: No evidence of DVT seen on physical exam. Negative Homan's sign. No cords or calf tenderness.   Recent Labs  10/17/12 0950  HGB 10.4*  HCT 31.9*    Assessment/Plan: Plan for discharge tomorrow Breastfeeding currently Unsure about contraception   LOS: 1 day   Kim Duran 10/18/2012, 7:59 AM

## 2012-10-18 NOTE — Anesthesia Postprocedure Evaluation (Signed)
  Anesthesia Post-op Note  Patient: Kim Duran  Procedure(s) Performed: * No procedures listed *  Patient Location: PACU  Anesthesia Type:Epidural  Level of Consciousness: awake, alert , oriented and patient cooperative  Airway and Oxygen Therapy: Patient Spontanous Breathing  Post-op Pain: none  Post-op Assessment: Post-op Vital signs reviewed, Patient's Cardiovascular Status Stable and Respiratory Function Stable  Post-op Vital Signs: Reviewed and stable  Complications: No apparent anesthesia complications

## 2012-10-19 ENCOUNTER — Other Ambulatory Visit: Payer: BC Managed Care – PPO

## 2012-10-19 ENCOUNTER — Encounter: Payer: BC Managed Care – PPO | Admitting: Obstetrics and Gynecology

## 2012-10-19 MED ORDER — IBUPROFEN 600 MG PO TABS: 600.0000 mg | ORAL_TABLET | Freq: Four times a day (QID) | ORAL | Status: DC

## 2012-10-19 NOTE — Discharge Summary (Signed)
Obstetric Discharge Summary Reason for Admission: onset of labor Prenatal Procedures: ultrasound Intrapartum Procedures: VBAC  Postpartum Procedures: none Complications-Operative and Postpartum: none Hemoglobin  Date Value Range Status  10/17/2012 10.4* 12.0 - 15.0 g/dL Final  09/16/6576 46.9   Final     HCT  Date Value Range Status  10/17/2012 31.9* 36.0 - 46.0 % Final  08/11/2012 33   Final    Physical Exam:  General: alert, cooperative and appears stated age Lochia: appropriate Uterine Fundus: firm DVT Evaluation: No evidence of DVT seen on physical exam.  Discharge Diagnoses: Term Pregnancy-delivered  Discharge Information: Date: 10/19/2012 Activity: unrestricted and pelvic rest Diet: routine Medications: Ibuprofen Condition: stable Instructions: refer to practice specific booklet Discharge to: home   Newborn Data: Live born female  Birth Weight: 7 lb 15.5 oz (3615 g) APGAR: 9, 9  Home with mother.  MERRELL, DAVID, MD Family Medicine Resident PGY2 10/19/2012, 7:41 AM  .I have seen the patient with the resident/student and agree with the above.  Tawnya Crook

## 2012-10-23 ENCOUNTER — Encounter: Payer: BC Managed Care – PPO | Admitting: Obstetrics & Gynecology

## 2012-10-23 ENCOUNTER — Other Ambulatory Visit: Payer: BC Managed Care – PPO

## 2012-10-24 ENCOUNTER — Inpatient Hospital Stay (HOSPITAL_COMMUNITY): Admission: RE | Admit: 2012-10-24 | Payer: BC Managed Care – HMO | Source: Ambulatory Visit

## 2012-10-25 NOTE — H&P (Signed)
Attestation of Attending Supervision of Advanced Practitioner (CNM/NP): Evaluation and management procedures were performed by the Advanced Practitioner under my supervision and collaboration.  I have reviewed the Advanced Practitioner's note and chart, and I agree with the management and plan.  Iasha Mccalister 10/25/2012 8:11 AM

## 2012-11-28 ENCOUNTER — Ambulatory Visit: Payer: BC Managed Care – PPO | Admitting: Women's Health

## 2012-12-01 ENCOUNTER — Ambulatory Visit: Payer: BC Managed Care – PPO | Admitting: Adult Health

## 2012-12-08 ENCOUNTER — Ambulatory Visit (INDEPENDENT_AMBULATORY_CARE_PROVIDER_SITE_OTHER): Payer: BC Managed Care – PPO | Admitting: Obstetrics & Gynecology

## 2012-12-08 ENCOUNTER — Encounter: Payer: Self-pay | Admitting: Obstetrics & Gynecology

## 2012-12-08 MED ORDER — NORETHINDRONE 0.35 MG PO TABS: 1.0000 | ORAL_TABLET | Freq: Every day | ORAL | Status: DC

## 2012-12-08 NOTE — Progress Notes (Signed)
Patient ID: Kim Duran, female   DOB: May 17, 1986, 27 y.o.   MRN: [id number] Subjective:     Kim Duran is a 27 y.o. female who presents for a postpartum visit. She is 6 weeks postpartum following a spontaneous vaginal delivery. I have fully reviewed the prenatal and intrapartum course. The delivery was at 40 gestational weeks. Outcome: spontaneous vaginal delivery. Anesthesia: epidural. Postpartum course has been normal. Baby's course has been normal. Baby is feeding by breast. Bleeding staining only. Bowel function is normal. Bladder function is normal. Patient is not sexually active. Contraception method is oral progesterone-only contraceptive. Postpartum depression screening: negative.  The following portions of the patient's history were reviewed and updated as appropriate: allergies, current medications, past family history, past medical history, past social history, past surgical history and problem list.  Review of Systems Pertinent items are noted in HPI.   Objective:    BP 130/80  Wt 322 lb (146.058 kg)  BMI 57.05 kg/m2  General:     Breasts:    Lungs:   Heart:    Abdomen: soft, non-tender; bowel sounds normal; no masses,  no organomegaly   Vulva:  normal  Vagina: normal vagina  Cervix:  normal  Corpus: normal size, contour, position, consistency, mobility, non-tender  Adnexa:  normal adnexa  Rectal Exam: Not performed.        Assessment:     normal postpartum exam. Pap smear not done at today's visit.   Plan:    1. Contraception: oral progesterone-only contraceptive 2 3. Follow up in: several months or as needed.

## 2014-01-08 ENCOUNTER — Encounter: Payer: Self-pay | Admitting: Obstetrics & Gynecology

## 2014-01-08 ENCOUNTER — Other Ambulatory Visit (HOSPITAL_COMMUNITY)
Admission: RE | Admit: 2014-01-08 | Discharge: 2014-01-08 | Disposition: A | Discharge status: Home / Self Care | Payer: BC Managed Care – HMO | Source: Ambulatory Visit | Attending: Obstetrics & Gynecology | Admitting: Obstetrics & Gynecology

## 2014-01-08 ENCOUNTER — Ambulatory Visit (INDEPENDENT_AMBULATORY_CARE_PROVIDER_SITE_OTHER): Payer: BC Managed Care – PPO | Admitting: Obstetrics & Gynecology

## 2014-01-08 VITALS — BP 138/78 | Ht 63.5 in | Wt 222.0 lb

## 2014-01-08 DIAGNOSIS — Z01419 Encounter for gynecological examination (general) (routine) without abnormal findings: Secondary | ICD-10-CM

## 2014-01-08 DIAGNOSIS — E039 Hypothyroidism, unspecified: Secondary | ICD-10-CM

## 2014-01-08 MED ORDER — NORETHINDRONE 0.35 MG PO TABS: 1.0000 | ORAL_TABLET | Freq: Every day | ORAL | Status: DC

## 2014-01-08 NOTE — Progress Notes (Signed)
Patient ID: Kim Duran, female   DOB: 1985/10/13, 28 y.o.   MRN: [id number] Subjective:     Kim Duran is a 28 y.o. female here for a routine exam.  No LMP recorded. Patient is not currently having periods (Reason: Oral contraceptives). [id number] Birth Control Method:  micronor Menstrual Calendar(currently): amenorrheic  Current complaints: none.   Current acute medical issues:  None, breast feeding   Recent Gynecologic History No LMP recorded. Patient is not currently having periods (Reason: Oral contraceptives). Last Pap: 2013,  normal Last mammogram: ,    Past Medical History  Diagnosis Date  . Medical history non-contributory     Past Surgical History  Procedure Laterality Date  . Cesarean section    . Left ovary and tube removed  2011    OB History   Grav Para Term Preterm Abortions TAB SAB Ect Mult Living   3 3 2 1      3       History   Social History  . Marital Status: Married    Spouse Name: N/A    Number of Children: N/A  . Years of Education: N/A   Social History Main Topics  . Smoking status: Never Smoker   . Smokeless tobacco: Never Used  . Alcohol Use: No  . Drug Use: No  . Sexual Activity: Yes    Birth Control/ Protection: Pill   Other Topics Concern  . None   Social History Narrative  . None    Family History  Problem Relation Age of Onset  . Cancer Other     breast and ovarian ca. relation unspecified  . Hypertension Mother   . Hypertension Father   . Cancer Paternal Aunt     breast cancer  . Diabetes Paternal Aunt      Review of Systems  Review of Systems  Constitutional: Negative for fever, chills, weight loss, malaise/fatigue and diaphoresis.  HENT: Negative for hearing loss, ear pain, nosebleeds, congestion, sore throat, neck pain, tinnitus and ear discharge.   Eyes: Negative for blurred vision, double vision, photophobia, pain, discharge and redness.  Respiratory: Negative for cough, hemoptysis, sputum  production, shortness of breath, wheezing and stridor.   Cardiovascular: Negative for chest pain, palpitations, orthopnea, claudication, leg swelling and PND.  Gastrointestinal: negative for abdominal pain. Negative for heartburn, nausea, vomiting, diarrhea, constipation, blood in stool and melena.  Genitourinary: Negative for dysuria, urgency, frequency, hematuria and flank pain.  Musculoskeletal: Negative for myalgias, back pain, joint pain and falls.  Skin: Negative for itching and rash.  Neurological: Negative for dizziness, tingling, tremors, sensory change, speech change, focal weakness, seizures, loss of consciousness, weakness and headaches.  Endo/Heme/Allergies: Negative for environmental allergies and polydipsia. Does not bruise/bleed easily.  Psychiatric/Behavioral: Negative for depression, suicidal ideas, hallucinations, memory loss and substance abuse. The patient is not nervous/anxious and does not have insomnia.        Objective:    Physical Exam  Vitals reviewed. Constitutional: She is oriented to person, place, and time. She appears well-developed and well-nourished.  HENT:  Head: Normocephalic and atraumatic.        Right Ear: External ear normal.  Left Ear: External ear normal.  Nose: Nose normal.  Mouth/Throat: Oropharynx is clear and moist.  Eyes: Conjunctivae and EOM are normal. Pupils are equal, round, and reactive to light. Right eye exhibits no discharge. Left eye exhibits no discharge. No scleral icterus.  Neck: Normal range of motion. Neck supple. No tracheal deviation present. No thyromegaly present.  Cardiovascular: Normal rate, regular rhythm, normal heart sounds and intact distal pulses.  Exam reveals no gallop and no friction rub.   No murmur heard. Respiratory: Effort normal and breath sounds normal. No respiratory distress. She has no wheezes. She has no rales. She exhibits no tenderness.  GI: Soft. Bowel sounds are normal. She exhibits no distension and  no mass. There is no tenderness. There is no rebound and no guarding.  Genitourinary:  Breasts no masses skin changes or nipple changes bilaterally      Vulva is normal without lesions Vagina is pink moist without discharge Cervix normal in appearance and pap is done Uterus is normal size shape and contour Adnexa is negative with normal sized ovaries   Musculoskeletal: Normal range of motion. She exhibits no edema and no tenderness.  Neurological: She is alert and oriented to person, place, and time. She has normal reflexes. She displays normal reflexes. No cranial nerve deficit. She exhibits normal muscle tone. Coordination normal.  Skin: Skin is warm and dry. No rash noted. No erythema. No pallor.  Psychiatric: She has a normal mood and affect. Her behavior is normal. Judgment and thought content normal.       Assessment:    Healthy female exam.    Plan:    Contraception: oral progesterone-only contraceptive. Follow up in: 1 year.

## 2014-01-08 NOTE — Addendum Note (Signed)
Addended by: Lazaro ArmsEURE, LUTHER H on: 01/08/2014 03:11 PM   Modules accepted: Orders

## 2014-01-09 LAB — TSH: TSH: 0.67 u[IU]/mL (ref 0.350–4.500)

## 2014-01-10 ENCOUNTER — Telehealth: Payer: Self-pay | Admitting: Obstetrics & Gynecology

## 2014-01-10 NOTE — Telephone Encounter (Signed)
Pt informed TSH from 01/08/2014 WNL.

## 2014-01-14 LAB — CYTOLOGY - PAP

## 2014-01-16 ENCOUNTER — Telehealth: Payer: Self-pay | Admitting: Obstetrics and Gynecology

## 2014-01-16 NOTE — Telephone Encounter (Signed)
Pt aware of results 

## 2014-03-13 ENCOUNTER — Encounter: Payer: Self-pay | Admitting: Adult Health

## 2014-03-13 ENCOUNTER — Ambulatory Visit (INDEPENDENT_AMBULATORY_CARE_PROVIDER_SITE_OTHER): Payer: BC Managed Care – PPO | Admitting: Adult Health

## 2014-03-13 DIAGNOSIS — Z3201 Encounter for pregnancy test, result positive: Secondary | ICD-10-CM

## 2014-03-13 LAB — POCT URINE PREGNANCY: PREG TEST UR: POSITIVE

## 2014-03-13 NOTE — Progress Notes (Signed)
Pt here for pregnancy test. Positive result. Pt reports no spotting or cramping. Advised if she notices any spotting or cramping, call office. Pt voiced understanding. JSY

## 2014-03-15 ENCOUNTER — Other Ambulatory Visit: Payer: Self-pay | Admitting: Obstetrics and Gynecology

## 2014-03-15 DIAGNOSIS — O3680X0 Pregnancy with inconclusive fetal viability, not applicable or unspecified: Secondary | ICD-10-CM

## 2014-03-19 ENCOUNTER — Other Ambulatory Visit: Payer: Self-pay | Admitting: Obstetrics and Gynecology

## 2014-03-19 ENCOUNTER — Ambulatory Visit (INDEPENDENT_AMBULATORY_CARE_PROVIDER_SITE_OTHER): Payer: BC Managed Care – PPO

## 2014-03-19 ENCOUNTER — Encounter: Payer: Self-pay | Admitting: Obstetrics and Gynecology

## 2014-03-19 DIAGNOSIS — O0930 Supervision of pregnancy with insufficient antenatal care, unspecified trimester: Secondary | ICD-10-CM

## 2014-03-19 DIAGNOSIS — O3680X0 Pregnancy with inconclusive fetal viability, not applicable or unspecified: Secondary | ICD-10-CM

## 2014-03-19 DIAGNOSIS — O0932 Supervision of pregnancy with insufficient antenatal care, second trimester: Secondary | ICD-10-CM

## 2014-03-19 NOTE — Progress Notes (Signed)
U/S-single active fetus, meas c/w 21+0wks EDD 07/30/2014, cx appears closed, bilateral adnexa appears WNL, FHR-143 bpm, female fetus, posterior Gr 0 placenta, will return for full anatomy screen

## 2014-03-27 ENCOUNTER — Encounter: Payer: BC Managed Care – PPO | Admitting: Women's Health

## 2014-03-28 ENCOUNTER — Encounter: Payer: BC Managed Care – PPO | Admitting: Advanced Practice Midwife

## 2014-04-02 ENCOUNTER — Encounter: Payer: BC Managed Care – PPO | Admitting: Advanced Practice Midwife

## 2014-04-04 ENCOUNTER — Encounter: Payer: BC Managed Care – PPO | Admitting: Advanced Practice Midwife

## 2014-04-09 ENCOUNTER — Telehealth: Payer: Self-pay | Admitting: *Deleted

## 2014-04-09 NOTE — Telephone Encounter (Signed)
Verification of pregnancy faxed to [phone] per pt request.

## 2014-04-10 ENCOUNTER — Encounter: Payer: Self-pay | Admitting: Women's Health

## 2014-04-10 ENCOUNTER — Ambulatory Visit (INDEPENDENT_AMBULATORY_CARE_PROVIDER_SITE_OTHER): Payer: BC Managed Care – PPO | Admitting: Women's Health

## 2014-04-10 VITALS — BP 110/78 | Wt 225.0 lb

## 2014-04-10 DIAGNOSIS — Z0184 Encounter for antibody response examination: Secondary | ICD-10-CM

## 2014-04-10 DIAGNOSIS — Z3482 Encounter for supervision of other normal pregnancy, second trimester: Secondary | ICD-10-CM

## 2014-04-10 DIAGNOSIS — O34219 Maternal care for unspecified type scar from previous cesarean delivery: Secondary | ICD-10-CM

## 2014-04-10 DIAGNOSIS — O09212 Supervision of pregnancy with history of pre-term labor, second trimester: Secondary | ICD-10-CM

## 2014-04-10 DIAGNOSIS — Z363 Encounter for antenatal screening for malformations: Secondary | ICD-10-CM

## 2014-04-10 DIAGNOSIS — O0930 Supervision of pregnancy with insufficient antenatal care, unspecified trimester: Secondary | ICD-10-CM

## 2014-04-10 DIAGNOSIS — O09892 Supervision of other high risk pregnancies, second trimester: Secondary | ICD-10-CM

## 2014-04-10 DIAGNOSIS — O09219 Supervision of pregnancy with history of pre-term labor, unspecified trimester: Secondary | ICD-10-CM

## 2014-04-10 DIAGNOSIS — Z331 Pregnant state, incidental: Secondary | ICD-10-CM

## 2014-04-10 DIAGNOSIS — Z1159 Encounter for screening for other viral diseases: Secondary | ICD-10-CM

## 2014-04-10 DIAGNOSIS — Z1389 Encounter for screening for other disorder: Secondary | ICD-10-CM

## 2014-04-10 DIAGNOSIS — Z8751 Personal history of pre-term labor: Secondary | ICD-10-CM | POA: Insufficient documentation

## 2014-04-10 DIAGNOSIS — Z0189 Encounter for other specified special examinations: Secondary | ICD-10-CM

## 2014-04-10 NOTE — Progress Notes (Signed)
  Subjective:  Kim Duran is a 28 y.o. [phone] African American female at 1540w1d by 21wk u/s, being seen today for her first obstetrical visit.  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  Desires another vbac. Last baby is 18mths old, was breastfeeding, and still on POPs, so didn't think she could be pregnant. Just took test few weeks ago. Infant still nurses some for sleep, etc. Pregnancy history fully reviewed.  Patient reports some burning pain in upper abd above umbilicus, no heartburn- sounds like diastis recti. Denies vb, cramping, uti s/s, abnormal/malodorous vag d/c, or vulvovaginal itching/irritation.  BP 110/78  Wt 225 lb (102.059 kg)  HISTORY: OB History  Gravida Para Term Preterm AB SAB TAB Ectopic Multiple Living  4 3 2 1      3     # Outcome Date GA Lbr Len/2nd Weight Sex Delivery Anes PTL Lv  4 CUR           3 TRM 10/17/12 6882w0d 24:30 / 00:27 7 lb 15.5 oz (3.615 kg) M SVD EPI N Y     Comments: No anomalies noted  2 TRM 2012 2350w5d 10:00 7 lb 10 oz (3.459 kg) F SVD EPI N Y  1 PRE 10/2007 8721w0d  4 lb (1.814 kg) F CS  Y Y     Past Medical History  Diagnosis Date  . Medical history non-contributory    Past Surgical History  Procedure Laterality Date  . Cesarean section    . Left ovary and tube removed  2011   Family History  Problem Relation Age of Onset  . Cancer Other     breast and ovarian ca. relation unspecified  . Hypertension Mother   . Hypertension Father   . Cancer Paternal Aunt     breast cancer  . Diabetes Paternal Aunt   . Cancer Paternal Aunt     breast  . Cancer Maternal Grandmother     Exam   System:     General: Well developed & nourished, no acute distress   Skin: Warm & dry, normal coloration and turgor, no rashes   Neurologic: Alert & oriented, normal mood   Cardiovascular: Regular rate & rhythm   Respiratory: Effort & rate normal, LCTAB, acyanotic   Abdomen: Soft, non tender   Extremities: normal strength, tone  Thin prep pap smear neg Jun 2015  FHR: 144 via doppler FH: 26cm   Assessment:   Pregnancy: [id number] Patient Active Problem List   Diagnosis Date Noted  . History of preterm delivery, currently pregnant 04/10/2014    Priority: High  . Supervision of other normal pregnancy 10/16/2012    Priority: High  . Previous cesarean section complicating pregnancy, antepartum condition or complication 09/27/2012    Priority: High    2940w1d [id number] New OB visit Late care Prev c/s w/ 2 successful vbac's Prev 33wk birth d/t placental abruption   Plan:  Initial labs drawn Continue prenatal vitamins Problem list reviewed and updated Reviewed recommended weight gain based on pre-gravid BMI Encouraged well-balanced diet Genetic Screening discussed Quad Screen: too late Cystic fibrosis screening discussed neg last preg Ultrasound discussed; fetal survey: requested Follow up in asap for anatomy u/s (no visit), then 4 weeks for pn2 and visit [facility] completed VBAC consent signed  Marge DuncansBooker, Keyetta Hollingworth Randall CNM, [facility] AllenWHNP-BC 04/10/2014 4:42 PM

## 2014-04-10 NOTE — Patient Instructions (Addendum)
You will have your sugar test next visit.  Please do not eat or drink anything after midnight the night before you come, not even water.  You will be here for at least two hours.     Call the office [phone]) or go to [facility] if:  You begin to have strong, frequent contractions  Your water breaks.  Sometimes it is a big gush of fluid, sometimes it is just a trickle that keeps getting your panties wet or running down your legs  You have vaginal bleeding.  It is normal to have a small amount of spotting if your cervix was checked.   You don't feel your baby moving like normal.  If you don't, get you something to eat and drink and lay down and focus on feeling your baby move.    [place] Pediatricians:  [facility] [phone]            [facility] [phone]                 [facility] [phone] (usually doesn't accept new patients unless you have family there already, you are always welcome to call and ask)             [facility] ([address]) [phone]   [facility]:   [facility]: [phone]  [facility]: [phone]   Second Trimester of Pregnancy The second trimester is from week 13 through week 28, months 4 through 6. The second trimester is often a time when you feel your best. Your body has also adjusted to being pregnant, and you begin to feel better physically. Usually, morning sickness has lessened or quit completely, you may have more energy, and you may have an increase in appetite. The second trimester is also a time when the fetus is growing rapidly. At the end of the sixth month, the fetus is about 9 inches long and weighs about 1 pounds. You will likely begin to feel the baby move (quickening) between 18 and 20 weeks of the pregnancy. BODY CHANGES Your body goes through many changes during pregnancy. The changes vary from  woman to woman.   Your weight will continue to increase. You will notice your lower abdomen bulging out.  You may begin to get stretch marks on your hips, abdomen, and breasts.  You may develop headaches that can be relieved by medicines approved by your health care provider.  You may urinate more often because the fetus is pressing on your bladder.  You may develop or continue to have heartburn as a result of your pregnancy.  You may develop constipation because certain hormones are causing the muscles that push waste through your intestines to slow down.  You may develop hemorrhoids or swollen, bulging veins (varicose veins).  You may have back pain because of the weight gain and pregnancy hormones relaxing your joints between the bones in your pelvis and as a result of a shift in weight and the muscles that support your balance.  Your breasts will continue to grow and be tender.  Your gums may bleed and may be sensitive to brushing and flossing.  Dark spots or blotches (chloasma, mask of pregnancy) may develop on your face. This will likely fade after the baby is born.  A dark line from your belly button to the pubic area (linea nigra) may appear. This will likely fade after the baby is born.  You may have  changes in your hair. These can include thickening of your hair, rapid growth, and changes in texture. Some women also have hair loss during or after pregnancy, or hair that feels dry or thin. Your hair will most likely return to normal after your baby is born. WHAT TO EXPECT AT YOUR PRENATAL VISITS During a routine prenatal visit:  You will be weighed to make sure you and the fetus are growing normally.  Your blood pressure will be taken.  Your abdomen will be measured to track your baby's growth.  The fetal heartbeat will be listened to.  Any test results from the previous visit will be discussed. Your health care provider may ask you:  How you are feeling.  If you  are feeling the baby move.  If you have had any abnormal symptoms, such as leaking fluid, bleeding, severe headaches, or abdominal cramping.  If you have any questions. Other tests that may be performed during your second trimester include:  Blood tests that check for:  Low iron levels (anemia).  Gestational diabetes (between 24 and 28 weeks).  Rh antibodies.  Urine tests to check for infections, diabetes, or protein in the urine.  An ultrasound to confirm the proper growth and development of the baby.  An amniocentesis to check for possible genetic problems.  Fetal screens for spina bifida and Down syndrome. HOME CARE INSTRUCTIONS   Avoid all smoking, herbs, alcohol, and unprescribed drugs. These chemicals affect the formation and growth of the baby.  Follow your health care provider's instructions regarding medicine use. There are medicines that are either safe or unsafe to take during pregnancy.  Exercise only as directed by your health care provider. Experiencing uterine cramps is a good sign to stop exercising.  Continue to eat regular, healthy meals.  Wear a good support bra for breast tenderness.  Do not use hot tubs, steam rooms, or saunas.  Wear your seat belt at all times when driving.  Avoid raw meat, uncooked cheese, cat litter boxes, and soil used by cats. These carry germs that can cause birth defects in the baby.  Take your prenatal vitamins.  Try taking a stool softener (if your health care provider approves) if you develop constipation. Eat more high-fiber foods, such as fresh vegetables or fruit and whole grains. Drink plenty of fluids to keep your urine clear or pale yellow.  Take warm sitz baths to soothe any pain or discomfort caused by hemorrhoids. Use hemorrhoid cream if your health care provider approves.  If you develop varicose veins, wear support hose. Elevate your feet for 15 minutes, 3-4 times a day. Limit salt in your diet.  Avoid heavy  lifting, wear low heel shoes, and practice good posture.  Rest with your legs elevated if you have leg cramps or low back pain.  Visit your dentist if you have not gone yet during your pregnancy. Use a soft toothbrush to brush your teeth and be gentle when you floss.  A sexual relationship may be continued unless your health care provider directs you otherwise.  Continue to go to all your prenatal visits as directed by your health care provider. SEEK MEDICAL CARE IF:   You have dizziness.  You have mild pelvic cramps, pelvic pressure, or nagging pain in the abdominal area.  You have persistent nausea, vomiting, or diarrhea.  You have a bad smelling vaginal discharge.  You have pain with urination. SEEK IMMEDIATE MEDICAL CARE IF:   You have a fever.  You are leaking  fluid from your vagina.  You have spotting or bleeding from your vagina.  You have severe abdominal cramping or pain.  You have rapid weight gain or loss.  You have shortness of breath with chest pain.  You notice sudden or extreme swelling of your face, hands, ankles, feet, or legs.  You have not felt your baby move in over an hour.  You have severe headaches that do not go away with medicine.  You have vision changes. Document Released: 06/22/2001 Document Revised: 07/03/2013 Document Reviewed: 08/29/2012 [facility] Patient Information 2015 [place], [place]. This information is not intended to replace advice given to you by your health care provider. Make sure you discuss any questions you have with your health care provider.

## 2014-04-11 LAB — CBC
HCT: 34.7 % — ABNORMAL LOW (ref 36.0–46.0)
HEMOGLOBIN: 11.8 g/dL — AB (ref 12.0–15.0)
MCH: 26.2 pg (ref 26.0–34.0)
MCHC: 34 g/dL (ref 30.0–36.0)
MCV: 77.1 fL — ABNORMAL LOW (ref 78.0–100.0)
Platelets: 336 10*3/uL (ref 150–400)
RBC: 4.5 MIL/uL (ref 3.87–5.11)
RDW: 17.2 % — ABNORMAL HIGH (ref 11.5–15.5)
WBC: 10.8 10*3/uL — ABNORMAL HIGH (ref 4.0–10.5)

## 2014-04-11 LAB — URINALYSIS, MICROSCOPIC ONLY
CASTS: NONE SEEN
Crystals: NONE SEEN

## 2014-04-11 LAB — URINALYSIS, ROUTINE W REFLEX MICROSCOPIC
Bilirubin Urine: NEGATIVE
GLUCOSE, UA: NEGATIVE mg/dL
Hgb urine dipstick: NEGATIVE
Ketones, ur: NEGATIVE mg/dL
Nitrite: NEGATIVE
PH: 6.5 (ref 5.0–8.0)
Protein, ur: NEGATIVE mg/dL
SPECIFIC GRAVITY, URINE: 1.021 (ref 1.005–1.030)
Urobilinogen, UA: 0.2 mg/dL (ref 0.0–1.0)

## 2014-04-11 LAB — DRUG SCREEN, URINE, NO CONFIRMATION
AMPHETAMINE SCRN UR: NEGATIVE
BENZODIAZEPINES.: NEGATIVE
Barbiturate Quant, Ur: NEGATIVE
COCAINE METABOLITES: NEGATIVE
Creatinine,U: 178 mg/dL
MARIJUANA METABOLITE: NEGATIVE
METHADONE: NEGATIVE
Opiate Screen, Urine: NEGATIVE
PHENCYCLIDINE (PCP): NEGATIVE
Propoxyphene: NEGATIVE

## 2014-04-11 LAB — ANTIBODY SCREEN: Antibody Screen: NEGATIVE

## 2014-04-11 LAB — GC/CHLAMYDIA PROBE AMP
CT PROBE, AMP APTIMA: NEGATIVE
GC Probe RNA: NEGATIVE

## 2014-04-11 LAB — RPR

## 2014-04-11 LAB — HIV ANTIBODY (ROUTINE TESTING W REFLEX): HIV: NONREACTIVE

## 2014-04-11 LAB — HEPATITIS B SURFACE ANTIGEN: HEP B S AG: NEGATIVE

## 2014-04-11 LAB — OXYCODONE SCREEN, UA, RFLX CONFIRM: OXYCODONE SCRN UR: NEGATIVE ng/mL

## 2014-04-11 LAB — VARICELLA ZOSTER ANTIBODY, IGG: VARICELLA IGG: 1739 {index} — AB (ref <135.00)

## 2014-04-11 LAB — ABO AND RH: RH TYPE: POSITIVE

## 2014-04-11 LAB — RUBELLA SCREEN: Rubella: 5.09 Index — ABNORMAL HIGH (ref <0.90)

## 2014-04-13 LAB — URINE CULTURE: Colony Count: >=100000

## 2014-04-15 ENCOUNTER — Encounter: Payer: Self-pay | Admitting: Women's Health

## 2014-04-15 ENCOUNTER — Telehealth: Payer: Self-pay | Admitting: *Deleted

## 2014-04-15 ENCOUNTER — Other Ambulatory Visit: Payer: Self-pay | Admitting: Women's Health

## 2014-04-15 DIAGNOSIS — O2342 Unspecified infection of urinary tract in pregnancy, second trimester: Secondary | ICD-10-CM | POA: Insufficient documentation

## 2014-04-15 MED ORDER — NITROFURANTOIN MONOHYD MACRO 100 MG PO CAPS: 100.0000 mg | ORAL_CAPSULE | Freq: Two times a day (BID) | ORAL | Status: DC

## 2014-04-15 NOTE — Telephone Encounter (Signed)
Pt informed that abx was sent to pharmacy for UTI.

## 2014-04-16 ENCOUNTER — Other Ambulatory Visit: Payer: BC Managed Care – PPO

## 2014-04-23 ENCOUNTER — Other Ambulatory Visit: Payer: BC Managed Care – PPO

## 2014-04-24 ENCOUNTER — Other Ambulatory Visit: Payer: BC Managed Care – PPO

## 2014-04-30 ENCOUNTER — Other Ambulatory Visit: Payer: BC Managed Care – PPO

## 2014-05-06 ENCOUNTER — Other Ambulatory Visit: Payer: Self-pay | Admitting: Women's Health

## 2014-05-06 ENCOUNTER — Ambulatory Visit (INDEPENDENT_AMBULATORY_CARE_PROVIDER_SITE_OTHER): Payer: BC Managed Care – PPO

## 2014-05-06 DIAGNOSIS — O09292 Supervision of pregnancy with other poor reproductive or obstetric history, second trimester: Secondary | ICD-10-CM

## 2014-05-06 DIAGNOSIS — Z36 Encounter for antenatal screening of mother: Secondary | ICD-10-CM

## 2014-05-06 DIAGNOSIS — Z1389 Encounter for screening for other disorder: Secondary | ICD-10-CM

## 2014-05-06 NOTE — Progress Notes (Addendum)
U/S(27+6wks)- frank breech active fetus, EFW 2 lb 11 oz (59th%tile), fluid WNL, posterior Gr 0 placenta, cx appears closed, bilateral adnexa appears WNL (Rt ovary noted, Lt ovary not noted-pt states prev. oophorectomy), female fetus, no major abnl noted

## 2014-05-07 ENCOUNTER — Encounter: Payer: Self-pay | Admitting: Women's Health

## 2014-05-08 ENCOUNTER — Other Ambulatory Visit: Payer: BC Managed Care – PPO

## 2014-05-09 ENCOUNTER — Ambulatory Visit (INDEPENDENT_AMBULATORY_CARE_PROVIDER_SITE_OTHER): Payer: BC Managed Care – PPO | Admitting: Obstetrics & Gynecology

## 2014-05-09 ENCOUNTER — Encounter: Payer: Self-pay | Admitting: Obstetrics & Gynecology

## 2014-05-09 ENCOUNTER — Other Ambulatory Visit: Payer: BC Managed Care – PPO

## 2014-05-09 VITALS — BP 110/70 | Wt 231.0 lb

## 2014-05-09 DIAGNOSIS — Z331 Pregnant state, incidental: Secondary | ICD-10-CM

## 2014-05-09 DIAGNOSIS — Z131 Encounter for screening for diabetes mellitus: Secondary | ICD-10-CM

## 2014-05-09 DIAGNOSIS — Z3482 Encounter for supervision of other normal pregnancy, second trimester: Secondary | ICD-10-CM

## 2014-05-09 DIAGNOSIS — Z113 Encounter for screening for infections with a predominantly sexual mode of transmission: Secondary | ICD-10-CM

## 2014-05-09 DIAGNOSIS — Z1389 Encounter for screening for other disorder: Secondary | ICD-10-CM

## 2014-05-09 DIAGNOSIS — Z114 Encounter for screening for human immunodeficiency virus [HIV]: Secondary | ICD-10-CM

## 2014-05-09 LAB — POCT URINALYSIS DIPSTICK
Blood, UA: NEGATIVE
Glucose, UA: NEGATIVE
Glucose, UA: NEGATIVE
KETONES UA: NEGATIVE
LEUKOCYTES UA: NEGATIVE
Nitrite, UA: NEGATIVE
Protein, UA: NEGATIVE
Protein, UA: NEGATIVE

## 2014-05-09 LAB — CBC
HCT: 33.1 % — ABNORMAL LOW (ref 36.0–46.0)
Hemoglobin: 11 g/dL — ABNORMAL LOW (ref 12.0–15.0)
MCH: 26.1 pg (ref 26.0–34.0)
MCHC: 33.2 g/dL (ref 30.0–36.0)
MCV: 78.4 fL (ref 78.0–100.0)
PLATELETS: 285 10*3/uL (ref 150–400)
RBC: 4.22 MIL/uL (ref 3.87–5.11)
RDW: 16.1 % — ABNORMAL HIGH (ref 11.5–15.5)
WBC: 9.4 10*3/uL (ref 4.0–10.5)

## 2014-05-09 NOTE — Progress Notes (Signed)
[id number] 3948w2d Estimated Date of Delivery: 07/30/14  Blood pressure 110/70, weight 231 lb (104.781 kg), currently breastfeeding.   BP weight and urine results all reviewed and noted.  Please refer to the obstetrical flow sheet for the fundal height and fetal heart rate documentation:  Patient reports good fetal movement, denies any bleeding and no rupture of membranes symptoms or regular contractions. Patient is without complaints. All questions were answered.  Plan:  Continued routine obstetrical care,   Follow up in 3 weeks for OB appointment, routine

## 2014-05-10 LAB — HSV 2 ANTIBODY, IGG: HSV 2 Glycoprotein G Ab, IgG: <0.1 IV

## 2014-05-10 LAB — ANTIBODY SCREEN: Antibody Screen: NEGATIVE

## 2014-05-10 LAB — GLUCOSE TOLERANCE, 2 HOURS W/ 1HR
GLUCOSE, FASTING: 76 mg/dL (ref 70–99)
GLUCOSE: 95 mg/dL (ref 70–170)
Glucose, 2 hour: 87 mg/dL (ref 70–139)

## 2014-05-10 LAB — RPR

## 2014-05-10 LAB — HIV ANTIBODY (ROUTINE TESTING W REFLEX): HIV 1&2 Ab, 4th Generation: NONREACTIVE

## 2014-05-13 ENCOUNTER — Encounter: Payer: Self-pay | Admitting: Obstetrics & Gynecology

## 2014-05-30 ENCOUNTER — Encounter: Payer: Self-pay | Admitting: Obstetrics & Gynecology

## 2014-05-30 ENCOUNTER — Ambulatory Visit (INDEPENDENT_AMBULATORY_CARE_PROVIDER_SITE_OTHER): Payer: BC Managed Care – PPO | Admitting: Obstetrics & Gynecology

## 2014-05-30 VITALS — BP 114/60 | Wt 232.0 lb

## 2014-05-30 DIAGNOSIS — Z1389 Encounter for screening for other disorder: Secondary | ICD-10-CM

## 2014-05-30 DIAGNOSIS — N390 Urinary tract infection, site not specified: Secondary | ICD-10-CM

## 2014-05-30 DIAGNOSIS — Z3483 Encounter for supervision of other normal pregnancy, third trimester: Secondary | ICD-10-CM

## 2014-05-30 DIAGNOSIS — Z331 Pregnant state, incidental: Secondary | ICD-10-CM

## 2014-05-30 LAB — POCT URINALYSIS DIPSTICK
Blood, UA: NEGATIVE
Glucose, UA: NEGATIVE
Ketones, UA: NEGATIVE
LEUKOCYTES UA: NEGATIVE
NITRITE UA: POSITIVE
Protein, UA: NEGATIVE

## 2014-05-30 NOTE — Addendum Note (Signed)
Addended by: Richardson ChiquitoRAVIS, ASHLEY M on: 05/30/2014 04:42 PM   Modules accepted: Orders

## 2014-05-30 NOTE — Addendum Note (Signed)
Addended by: Richardson ChiquitoRAVIS, ASHLEY M on: 05/30/2014 04:57 PM   Modules accepted: Orders

## 2014-05-30 NOTE — Progress Notes (Signed)
[id number] 1765w2d Estimated Date of Delivery: 07/30/14  Blood pressure 114/60, weight 232 lb (105.235 kg), currently breastfeeding.   BP weight and urine results all reviewed and noted.  Please refer to the obstetrical flow sheet for the fundal height and fetal heart rate documentation:  Patient reports good fetal movement, denies any bleeding and no rupture of membranes symptoms or regular contractions. Patient is without complaints. All questions were answered.  Plan:  Continued routine obstetrical care,   Follow up in 2 weeks for OB appointment, routine

## 2014-06-02 LAB — URINE CULTURE: Colony Count: >=100000

## 2014-06-09 MED ORDER — CEPHALEXIN 500 MG PO CAPS: 500.0000 mg | ORAL_CAPSULE | Freq: Three times a day (TID) | ORAL | Status: DC

## 2014-06-09 NOTE — Addendum Note (Signed)
Addended by: Lazaro ArmsEURE, Trevionne Advani H on: 06/09/2014 03:38 PM   Modules accepted: Orders

## 2014-06-10 ENCOUNTER — Telehealth: Payer: Self-pay | Admitting: *Deleted

## 2014-06-10 NOTE — Telephone Encounter (Signed)
Pt aware to go to pharmacy and pick up Rx and start taking it today. Pt verbalized understanding.

## 2014-06-10 NOTE — Telephone Encounter (Signed)
-----   Message from Lazaro ArmsLuther H Eure, MD sent at 06/09/2014  3:38 PM EST ----- Pt needs to pick up keflex to treat UTI ----- Message -----    From: Richardson ChiquitoAshley M Travis, LPN    Sent: [id number]/19/2015   4:42 PM      To: Lazaro ArmsLuther H Eure, MD

## 2014-06-13 ENCOUNTER — Encounter: Payer: BC Managed Care – PPO | Admitting: Advanced Practice Midwife

## 2014-06-20 ENCOUNTER — Ambulatory Visit (INDEPENDENT_AMBULATORY_CARE_PROVIDER_SITE_OTHER): Payer: BC Managed Care – PPO | Admitting: Advanced Practice Midwife

## 2014-06-20 VITALS — BP 110/60 | Wt 232.0 lb

## 2014-06-20 DIAGNOSIS — Z331 Pregnant state, incidental: Secondary | ICD-10-CM

## 2014-06-20 DIAGNOSIS — Z1389 Encounter for screening for other disorder: Secondary | ICD-10-CM

## 2014-06-20 DIAGNOSIS — Z3483 Encounter for supervision of other normal pregnancy, third trimester: Secondary | ICD-10-CM

## 2014-06-20 LAB — POCT URINALYSIS DIPSTICK
Glucose, UA: NEGATIVE
Leukocytes, UA: NEGATIVE
NITRITE UA: NEGATIVE
RBC UA: NEGATIVE

## 2014-06-20 NOTE — Progress Notes (Signed)
[id number] 7430w2d Estimated Date of Delivery: 07/30/14  Blood pressure 110/60, weight 232 lb (105.235 kg), currently breastfeeding.   BP weight and urine results all reviewed and noted.  Please refer to the obstetrical flow sheet for the fundal height and fetal heart rate documentation:  Patient reports good fetal movement, denies any bleeding and no rupture of membranes symptoms or regular contractions. Patient is without complaints. All questions were answered.  Plan:  Continued routine obstetrical care,   Follow up in 2 weeks for OB appointment,

## 2014-07-04 ENCOUNTER — Ambulatory Visit (INDEPENDENT_AMBULATORY_CARE_PROVIDER_SITE_OTHER): Payer: BC Managed Care – PPO | Admitting: Obstetrics & Gynecology

## 2014-07-04 ENCOUNTER — Encounter: Payer: Self-pay | Admitting: Obstetrics & Gynecology

## 2014-07-04 VITALS — BP 118/60 | Wt 236.0 lb

## 2014-07-04 DIAGNOSIS — O3421 Maternal care for scar from previous cesarean delivery: Secondary | ICD-10-CM

## 2014-07-04 DIAGNOSIS — Z1389 Encounter for screening for other disorder: Secondary | ICD-10-CM

## 2014-07-04 DIAGNOSIS — Z331 Pregnant state, incidental: Secondary | ICD-10-CM

## 2014-07-04 DIAGNOSIS — O34219 Maternal care for unspecified type scar from previous cesarean delivery: Secondary | ICD-10-CM

## 2014-07-04 DIAGNOSIS — Z3483 Encounter for supervision of other normal pregnancy, third trimester: Secondary | ICD-10-CM

## 2014-07-04 LAB — POCT URINALYSIS DIPSTICK
GLUCOSE UA: NEGATIVE
Nitrite, UA: NEGATIVE
RBC UA: NEGATIVE

## 2014-07-04 NOTE — Progress Notes (Signed)
[id number] 5814w2d Estimated Date of Delivery: 07/30/14  Blood pressure 118/60, weight 236 lb (107.049 kg), currently breastfeeding.   BP weight and urine results all reviewed and noted.  Please refer to the obstetrical flow sheet for the fundal height and fetal heart rate documentation:  Patient reports good fetal movement, denies any bleeding and no rupture of membranes symptoms or regular contractions. Patient is without complaints. All questions were answered.  Plan:  Continued routine obstetrical care,   Follow up in 1 weeks for OB appointment,

## 2014-07-11 ENCOUNTER — Ambulatory Visit (INDEPENDENT_AMBULATORY_CARE_PROVIDER_SITE_OTHER): Payer: BLUE CROSS/BLUE SHIELD | Admitting: Advanced Practice Midwife

## 2014-07-11 ENCOUNTER — Encounter: Payer: Self-pay | Admitting: Advanced Practice Midwife

## 2014-07-11 VITALS — BP 100/60 | Wt 236.0 lb

## 2014-07-11 DIAGNOSIS — O0930 Supervision of pregnancy with insufficient antenatal care, unspecified trimester: Secondary | ICD-10-CM | POA: Diagnosis not present

## 2014-07-11 DIAGNOSIS — Z331 Pregnant state, incidental: Secondary | ICD-10-CM

## 2014-07-11 DIAGNOSIS — Z3483 Encounter for supervision of other normal pregnancy, third trimester: Secondary | ICD-10-CM

## 2014-07-11 DIAGNOSIS — Z1389 Encounter for screening for other disorder: Secondary | ICD-10-CM

## 2014-07-11 DIAGNOSIS — Z1159 Encounter for screening for other viral diseases: Secondary | ICD-10-CM

## 2014-07-11 DIAGNOSIS — Z118 Encounter for screening for other infectious and parasitic diseases: Secondary | ICD-10-CM

## 2014-07-11 DIAGNOSIS — O3421 Maternal care for scar from previous cesarean delivery: Secondary | ICD-10-CM | POA: Diagnosis not present

## 2014-07-11 DIAGNOSIS — Z3685 Encounter for antenatal screening for Streptococcus B: Secondary | ICD-10-CM

## 2014-07-11 LAB — POCT URINALYSIS DIPSTICK
Blood, UA: NEGATIVE
Glucose, UA: NEGATIVE
Ketones, UA: NEGATIVE
LEUKOCYTES UA: NEGATIVE
NITRITE UA: NEGATIVE
PROTEIN UA: NEGATIVE

## 2014-07-11 LAB — OB RESULTS CONSOLE GC/CHLAMYDIA
Chlamydia: NEGATIVE
GC PROBE AMP, GENITAL: NEGATIVE

## 2014-07-11 NOTE — Progress Notes (Signed)
[id number] 6811w2d Estimated Date of Delivery: 07/30/14  Blood pressure 100/60, weight 236 lb (107.049 kg), currently breastfeeding.   BP weight and urine results all reviewed and noted.  Please refer to the obstetrical flow sheet for the fundal height and fetal heart rate documentation:  Patient reports good fetal movement, denies any bleeding and no rupture of membranes symptoms or regular contractions. Patient is without complaints.other than normal pregnancy complaints All questions were answered.  Plan:  Continued routine obstetrical care, GBS today  Follow up in 1 weeks for OB appointment,

## 2014-07-12 LAB — GC/CHLAMYDIA PROBE AMP
CT Probe RNA: NEGATIVE
GC PROBE AMP APTIMA: NEGATIVE

## 2014-07-12 NOTE — L&D Delivery Note (Signed)
Delivery Note At 8:21 PM a viable female was delivered via Vaginal, Spontaneous Delivery (Presentation: Left Occiput Anterior).  APGAR: 8, 9; weight 8 lb 5.9 oz (3795 g).   Placenta status: Intact, Spontaneous.  Cord: 3 vessels with the following complications: None.   Anesthesia: Epidural  Episiotomy: None Lacerations: None Suture Repair: n/a Est. Blood Loss (mL): 250  Mom to postpartum.  Baby to Couplet care / Skin to Skin.  Kathee DeltonMcKeag, Ian D 07/30/2014, 10:58 PM

## 2014-07-13 LAB — STREP B DNA PROBE: GBSP: NOT DETECTED

## 2014-07-18 ENCOUNTER — Ambulatory Visit (INDEPENDENT_AMBULATORY_CARE_PROVIDER_SITE_OTHER): Payer: BLUE CROSS/BLUE SHIELD | Admitting: Obstetrics & Gynecology

## 2014-07-18 ENCOUNTER — Encounter: Payer: Self-pay | Admitting: Obstetrics & Gynecology

## 2014-07-18 ENCOUNTER — Encounter: Payer: BC Managed Care – PPO | Admitting: Obstetrics and Gynecology

## 2014-07-18 VITALS — BP 110/80 | Wt 235.0 lb

## 2014-07-18 DIAGNOSIS — Z331 Pregnant state, incidental: Secondary | ICD-10-CM | POA: Diagnosis not present

## 2014-07-18 DIAGNOSIS — O3421 Maternal care for scar from previous cesarean delivery: Secondary | ICD-10-CM

## 2014-07-18 DIAGNOSIS — Z1389 Encounter for screening for other disorder: Secondary | ICD-10-CM | POA: Diagnosis not present

## 2014-07-18 DIAGNOSIS — Z3483 Encounter for supervision of other normal pregnancy, third trimester: Secondary | ICD-10-CM | POA: Diagnosis not present

## 2014-07-18 LAB — POCT URINALYSIS DIPSTICK
GLUCOSE UA: NEGATIVE
Ketones, UA: NEGATIVE
Nitrite, UA: NEGATIVE
RBC UA: NEGATIVE

## 2014-07-18 NOTE — Progress Notes (Signed)
[id number] 332w2d Estimated Date of Delivery: 07/30/14  Blood pressure 110/80, weight 235 lb (106.595 kg), currently breastfeeding.   BP weight and urine results all reviewed and noted.  Please refer to the obstetrical flow sheet for the fundal height and fetal heart rate documentation:  Patient reports good fetal movement, denies any bleeding and no rupture of membranes symptoms or regular contractions. Patient is without complaints. All questions were answered.  Plan:  Continued routine obstetrical care,   Follow up in 1 weeks for OB appointment, routine

## 2014-07-25 ENCOUNTER — Ambulatory Visit (INDEPENDENT_AMBULATORY_CARE_PROVIDER_SITE_OTHER): Payer: BLUE CROSS/BLUE SHIELD | Admitting: Obstetrics & Gynecology

## 2014-07-25 ENCOUNTER — Encounter: Payer: Self-pay | Admitting: Obstetrics & Gynecology

## 2014-07-25 VITALS — BP 120/70 | Wt 236.0 lb

## 2014-07-25 DIAGNOSIS — O3421 Maternal care for scar from previous cesarean delivery: Secondary | ICD-10-CM | POA: Diagnosis not present

## 2014-07-25 DIAGNOSIS — Z331 Pregnant state, incidental: Secondary | ICD-10-CM | POA: Diagnosis not present

## 2014-07-25 DIAGNOSIS — Z3483 Encounter for supervision of other normal pregnancy, third trimester: Secondary | ICD-10-CM

## 2014-07-25 DIAGNOSIS — Z1389 Encounter for screening for other disorder: Secondary | ICD-10-CM

## 2014-07-25 LAB — POCT URINALYSIS DIPSTICK
Blood, UA: NEGATIVE
GLUCOSE UA: NEGATIVE
Leukocytes, UA: NEGATIVE
NITRITE UA: NEGATIVE

## 2014-07-25 NOTE — Addendum Note (Signed)
Addended by: Richardson ChiquitoRAVIS, ASHLEY M on: 07/25/2014 04:37 PM   Modules accepted: Orders

## 2014-07-25 NOTE — Progress Notes (Signed)
[id number] 5871w2d Estimated Date of Delivery: 07/30/14  Blood pressure 120/70, weight 236 lb (107.049 kg), currently breastfeeding.   BP weight and urine results all reviewed and noted.  Please refer to the obstetrical flow sheet for the fundal height and fetal heart rate documentation:  Patient reports good fetal movement, denies any bleeding and no rupture of membranes symptoms or regular contractions. Patient is without complaints. All questions were answered.  Plan:  Continued routine obstetrical care,   Follow up in 1 weeks for OB appointment, routine

## 2014-07-28 ENCOUNTER — Inpatient Hospital Stay (HOSPITAL_COMMUNITY)
Admission: AD | Admit: 2014-07-28 | Discharge: 2014-07-28 | Disposition: A | Discharge status: Home / Self Care | Payer: BLUE CROSS/BLUE SHIELD | Source: Ambulatory Visit | Attending: Obstetrics & Gynecology | Admitting: Obstetrics & Gynecology

## 2014-07-28 ENCOUNTER — Encounter (HOSPITAL_COMMUNITY): Payer: Self-pay | Admitting: *Deleted

## 2014-07-28 DIAGNOSIS — O471 False labor at or after 37 completed weeks of gestation: Secondary | ICD-10-CM | POA: Diagnosis not present

## 2014-07-28 DIAGNOSIS — Z3A39 39 weeks gestation of pregnancy: Secondary | ICD-10-CM | POA: Insufficient documentation

## 2014-07-28 NOTE — MAU Note (Signed)
Patient presents at 139 weeks gestation with complaint of contractions X 2 weeks that became more regular around 0400 today.

## 2014-07-28 NOTE — Discharge Instructions (Signed)

## 2014-07-29 ENCOUNTER — Encounter: Payer: Self-pay | Admitting: Obstetrics and Gynecology

## 2014-07-29 ENCOUNTER — Other Ambulatory Visit: Payer: Self-pay | Admitting: Obstetrics and Gynecology

## 2014-07-29 ENCOUNTER — Inpatient Hospital Stay (HOSPITAL_COMMUNITY)
Admission: AD | Admit: 2014-07-29 | Discharge: 2014-08-01 | DRG: 775 | Disposition: A | Discharge status: Home / Self Care | Payer: BLUE CROSS/BLUE SHIELD | Source: Ambulatory Visit | Attending: Family Medicine | Admitting: Family Medicine

## 2014-07-29 ENCOUNTER — Ambulatory Visit (INDEPENDENT_AMBULATORY_CARE_PROVIDER_SITE_OTHER): Payer: BLUE CROSS/BLUE SHIELD | Admitting: Obstetrics and Gynecology

## 2014-07-29 ENCOUNTER — Telehealth: Payer: Self-pay | Admitting: Advanced Practice Midwife

## 2014-07-29 ENCOUNTER — Telehealth (HOSPITAL_COMMUNITY): Payer: Self-pay | Admitting: *Deleted

## 2014-07-29 DIAGNOSIS — O3421 Maternal care for scar from previous cesarean delivery: Secondary | ICD-10-CM | POA: Diagnosis not present

## 2014-07-29 DIAGNOSIS — Z331 Pregnant state, incidental: Secondary | ICD-10-CM

## 2014-07-29 DIAGNOSIS — Z3A40 40 weeks gestation of pregnancy: Secondary | ICD-10-CM | POA: Diagnosis present

## 2014-07-29 DIAGNOSIS — O09892 Supervision of other high risk pregnancies, second trimester: Secondary | ICD-10-CM

## 2014-07-29 DIAGNOSIS — Z3483 Encounter for supervision of other normal pregnancy, third trimester: Secondary | ICD-10-CM

## 2014-07-29 DIAGNOSIS — Z1389 Encounter for screening for other disorder: Secondary | ICD-10-CM | POA: Diagnosis not present

## 2014-07-29 DIAGNOSIS — Z3490 Encounter for supervision of normal pregnancy, unspecified, unspecified trimester: Secondary | ICD-10-CM

## 2014-07-29 DIAGNOSIS — O2342 Unspecified infection of urinary tract in pregnancy, second trimester: Secondary | ICD-10-CM

## 2014-07-29 DIAGNOSIS — O09212 Supervision of pregnancy with history of pre-term labor, second trimester: Secondary | ICD-10-CM

## 2014-07-29 LAB — POCT URINALYSIS DIPSTICK
Blood, UA: NEGATIVE
GLUCOSE UA: NEGATIVE
Ketones, UA: NEGATIVE
LEUKOCYTES UA: NEGATIVE
Nitrite, UA: NEGATIVE
Protein, UA: NEGATIVE

## 2014-07-29 NOTE — Telephone Encounter (Signed)
Pt c/o a lot of pain and pressure, contractions. [facility] and was told "needed to contract more." Pt state she is "miserable and would like to be seen today." Pt scheduled with Dr. Emelda FearFerguson today at 12 pm.

## 2014-07-29 NOTE — Telephone Encounter (Signed)
Preadmission screen  

## 2014-07-29 NOTE — Progress Notes (Signed)
[id number] 3579w6d Estimated Date of Delivery: 07/30/14  Blood pressure 126/80, weight 237 lb (107.502 kg), currently breastfeeding.  Requests IOL for transportation and scheduling considerations. [place] and husb works 1 hr further. refer to the ob flow sheet for FH and FHR, also BP, Wt, Urine results:notable for none  Patient reports   good fetal movement, denies any bleeding and no rupture of membranes symptoms or regular contractions. Patient complaints:miserable, irregular contractions. Requesting IOL due to transportation concerns. Had requested IOL with last child, delivered at 8 pm day of IOL.   Questions were answered. Assessment:  Plan:  Continued routine obstetrical care, With IOL scheduled for 7:30 am 07/30/14 (40w).  F/u in 4 weeks for pp check, and schedule nexplanon.

## 2014-07-29 NOTE — Progress Notes (Signed)
Pt having pain that started yesterday morning and is getting worse. Pt states that she is very swollen "down there". Pt states that she is miserable.

## 2014-07-30 ENCOUNTER — Encounter (HOSPITAL_COMMUNITY): Payer: Self-pay | Admitting: *Deleted

## 2014-07-30 ENCOUNTER — Inpatient Hospital Stay (HOSPITAL_COMMUNITY): Payer: BLUE CROSS/BLUE SHIELD | Admitting: Anesthesiology

## 2014-07-30 ENCOUNTER — Inpatient Hospital Stay (HOSPITAL_COMMUNITY): Admission: RE | Admit: 2014-07-30 | Payer: BLUE CROSS/BLUE SHIELD | Source: Ambulatory Visit

## 2014-07-30 DIAGNOSIS — Z3490 Encounter for supervision of normal pregnancy, unspecified, unspecified trimester: Secondary | ICD-10-CM

## 2014-07-30 DIAGNOSIS — Z3A40 40 weeks gestation of pregnancy: Secondary | ICD-10-CM | POA: Diagnosis not present

## 2014-07-30 DIAGNOSIS — Z3483 Encounter for supervision of other normal pregnancy, third trimester: Secondary | ICD-10-CM | POA: Diagnosis present

## 2014-07-30 LAB — CBC
HCT: 31.2 % — ABNORMAL LOW (ref 36.0–46.0)
Hemoglobin: 10.5 g/dL — ABNORMAL LOW (ref 12.0–15.0)
MCH: 24.6 pg — ABNORMAL LOW (ref 26.0–34.0)
MCHC: 33.7 g/dL (ref 30.0–36.0)
MCV: 73.2 fL — ABNORMAL LOW (ref 78.0–100.0)
PLATELETS: 250 10*3/uL (ref 150–400)
RBC: 4.26 MIL/uL (ref 3.87–5.11)
RDW: 16.9 % — AB (ref 11.5–15.5)
WBC: 9.9 10*3/uL (ref 4.0–10.5)

## 2014-07-30 LAB — TYPE AND SCREEN
ABO/RH(D): B POS
Antibody Screen: NEGATIVE

## 2014-07-30 MED ORDER — EPHEDRINE 5 MG/ML INJ
10.0000 mg | INTRAVENOUS | Status: DC | PRN
  Filled 2014-07-30: qty 2

## 2014-07-30 MED ORDER — TETANUS-DIPHTH-ACELL PERTUSSIS 5-2.5-18.5 LF-MCG/0.5 IM SUSP: 0.5000 mL | Freq: Once | INTRAMUSCULAR | Status: DC

## 2014-07-30 MED ORDER — PHENYLEPHRINE 40 MCG/ML (10ML) SYRINGE FOR IV PUSH (FOR BLOOD PRESSURE SUPPORT)
80.0000 ug | PREFILLED_SYRINGE | INTRAVENOUS | Status: DC | PRN
  Filled 2014-07-30: qty 2

## 2014-07-30 MED ORDER — OXYTOCIN BOLUS FROM INFUSION
500.0000 mL | INTRAVENOUS | Status: DC
  Administered 2014-07-30: 500 mL via INTRAVENOUS

## 2014-07-30 MED ORDER — ZOLPIDEM TARTRATE 5 MG PO TABS
5.0000 mg | ORAL_TABLET | Freq: Once | ORAL | Status: AC
  Administered 2014-07-30: 5 mg via ORAL
  Filled 2014-07-30: qty 1

## 2014-07-30 MED ORDER — ONDANSETRON HCL 4 MG PO TABS: 4.0000 mg | ORAL_TABLET | ORAL | Status: DC | PRN

## 2014-07-30 MED ORDER — LANOLIN HYDROUS EX OINT: TOPICAL_OINTMENT | CUTANEOUS | Status: DC | PRN

## 2014-07-30 MED ORDER — DIPHENHYDRAMINE HCL 25 MG PO CAPS: 25.0000 mg | ORAL_CAPSULE | Freq: Four times a day (QID) | ORAL | Status: DC | PRN

## 2014-07-30 MED ORDER — PRENATAL MULTIVITAMIN CH
1.0000 | ORAL_TABLET | Freq: Every day | ORAL | Status: DC
  Administered 2014-07-31: 1 via ORAL
  Filled 2014-07-30: qty 1

## 2014-07-30 MED ORDER — SENNOSIDES-DOCUSATE SODIUM 8.6-50 MG PO TABS
2.0000 | ORAL_TABLET | ORAL | Status: DC
  Administered 2014-08-01: 2 via ORAL
  Filled 2014-07-30: qty 1
  Filled 2014-07-30: qty 2

## 2014-07-30 MED ORDER — OXYCODONE-ACETAMINOPHEN 5-325 MG PO TABS
1.0000 | ORAL_TABLET | ORAL | Status: DC | PRN
  Administered 2014-07-31 (×4): 1 via ORAL
  Filled 2014-07-30 (×4): qty 1

## 2014-07-30 MED ORDER — DIPHENHYDRAMINE HCL 50 MG/ML IJ SOLN
12.5000 mg | INTRAMUSCULAR | Status: DC | PRN
  Administered 2014-07-30: 12.5 mg via INTRAVENOUS
  Filled 2014-07-30: qty 1

## 2014-07-30 MED ORDER — LIDOCAINE HCL (PF) 1 % IJ SOLN
30.0000 mL | INTRAMUSCULAR | Status: DC | PRN
  Filled 2014-07-30: qty 30

## 2014-07-30 MED ORDER — IBUPROFEN 600 MG PO TABS
600.0000 mg | ORAL_TABLET | Freq: Four times a day (QID) | ORAL | Status: DC
  Administered 2014-07-30 – 2014-08-01 (×6): 600 mg via ORAL
  Filled 2014-07-30 (×6): qty 1

## 2014-07-30 MED ORDER — FENTANYL 2.5 MCG/ML BUPIVACAINE 1/10 % EPIDURAL INFUSION (WH - ANES)
14.0000 mL/h | INTRAMUSCULAR | Status: DC | PRN
  Administered 2014-07-30: 14 mL/h via EPIDURAL
  Filled 2014-07-30: qty 125

## 2014-07-30 MED ORDER — TERBUTALINE SULFATE 1 MG/ML IJ SOLN
0.2500 mg | Freq: Once | INTRAMUSCULAR | Status: DC | PRN
  Filled 2014-07-30: qty 1

## 2014-07-30 MED ORDER — PHENYLEPHRINE 40 MCG/ML (10ML) SYRINGE FOR IV PUSH (FOR BLOOD PRESSURE SUPPORT)
80.0000 ug | PREFILLED_SYRINGE | INTRAVENOUS | Status: DC | PRN
  Filled 2014-07-30: qty 20
  Filled 2014-07-30: qty 2

## 2014-07-30 MED ORDER — BUTORPHANOL TARTRATE 1 MG/ML IJ SOLN: 1.0000 mg | INTRAMUSCULAR | Status: DC | PRN

## 2014-07-30 MED ORDER — LACTATED RINGERS IV SOLN
INTRAVENOUS | Status: DC
  Administered 2014-07-30 (×2): via INTRAVENOUS

## 2014-07-30 MED ORDER — BENZOCAINE-MENTHOL 20-0.5 % EX AERO
1.0000 "application " | INHALATION_SPRAY | CUTANEOUS | Status: DC | PRN
  Administered 2014-07-30: 1 via TOPICAL
  Filled 2014-07-30: qty 56

## 2014-07-30 MED ORDER — DIBUCAINE 1 % RE OINT: 1.0000 "application " | TOPICAL_OINTMENT | RECTAL | Status: DC | PRN

## 2014-07-30 MED ORDER — OXYTOCIN 40 UNITS IN LACTATED RINGERS INFUSION - SIMPLE MED
1.0000 m[IU]/min | INTRAVENOUS | Status: DC
  Administered 2014-07-30: 11 m[IU]/min via INTRAVENOUS
  Administered 2014-07-30: 1 m[IU]/min via INTRAVENOUS
  Administered 2014-07-30: 3 m[IU]/min via INTRAVENOUS

## 2014-07-30 MED ORDER — OXYCODONE-ACETAMINOPHEN 5-325 MG PO TABS: 1.0000 | ORAL_TABLET | ORAL | Status: DC | PRN

## 2014-07-30 MED ORDER — LACTATED RINGERS IV SOLN: 500.0000 mL | INTRAVENOUS | Status: DC | PRN

## 2014-07-30 MED ORDER — OXYTOCIN 40 UNITS IN LACTATED RINGERS INFUSION - SIMPLE MED: 62.5000 mL/h | INTRAVENOUS | Status: DC | PRN

## 2014-07-30 MED ORDER — SIMETHICONE 80 MG PO CHEW: 80.0000 mg | CHEWABLE_TABLET | ORAL | Status: DC | PRN

## 2014-07-30 MED ORDER — ONDANSETRON HCL 4 MG/2ML IJ SOLN: 4.0000 mg | Freq: Four times a day (QID) | INTRAMUSCULAR | Status: DC | PRN

## 2014-07-30 MED ORDER — LACTATED RINGERS IV SOLN
500.0000 mL | Freq: Once | INTRAVENOUS | Status: AC
  Administered 2014-07-30: 500 mL via INTRAVENOUS

## 2014-07-30 MED ORDER — CITRIC ACID-SODIUM CITRATE 334-500 MG/5ML PO SOLN: 30.0000 mL | ORAL | Status: DC | PRN

## 2014-07-30 MED ORDER — ACETAMINOPHEN 325 MG PO TABS
650.0000 mg | ORAL_TABLET | ORAL | Status: DC | PRN
  Administered 2014-07-30: 650 mg via ORAL
  Filled 2014-07-30: qty 2

## 2014-07-30 MED ORDER — LIDOCAINE HCL (PF) 1 % IJ SOLN
INTRAMUSCULAR | Status: DC | PRN
  Administered 2014-07-30 (×2): 5 mL

## 2014-07-30 MED ORDER — ONDANSETRON HCL 4 MG/2ML IJ SOLN: 4.0000 mg | INTRAMUSCULAR | Status: DC | PRN

## 2014-07-30 MED ORDER — WITCH HAZEL-GLYCERIN EX PADS: 1.0000 "application " | MEDICATED_PAD | CUTANEOUS | Status: DC | PRN

## 2014-07-30 MED ORDER — OXYCODONE-ACETAMINOPHEN 5-325 MG PO TABS: 2.0000 | ORAL_TABLET | ORAL | Status: DC | PRN

## 2014-07-30 MED ORDER — OXYTOCIN 40 UNITS IN LACTATED RINGERS INFUSION - SIMPLE MED
62.5000 mL/h | INTRAVENOUS | Status: DC
  Administered 2014-07-30: 62.5 mL/h via INTRAVENOUS
  Filled 2014-07-30: qty 1000

## 2014-07-30 NOTE — Plan of Care (Signed)
Problem: Consults Goal: Birthing Suites Patient Information Press F2 to bring up selections list Outcome: Completed/Met Date Met:  07/30/14  Pt 37-[redacted] weeks EGA and Inpatient induction at 0730 am

## 2014-07-30 NOTE — H&P (Signed)
LABOR ADMISSION HISTORY AND PHYSICAL  Kim Duran is a 29 y.o. female [phone] with IUP at [redacted]w[redacted]d by L/21 wk [place] presenting for IOL and loss of mucous plug. Reports has been "miserable" with intermittent contractions over the past several days. Seen in Reg OB appt this AM and scheduled for IOL at [redacted]w[redacted]d given distance from hospital (1 hr). This PM she reports back pain worsened and she lost mucous plug, therefore presented to [facility] for eval.   She reports +FMs, No LOF, no VB, no blurry vision, headaches or peripheral edema, and RUQ pain.  She plans on breast + bottle feeding. She request Nexplanon for birth control.  Dating: By 21 wk --->  Estimated Date of Delivery: 07/30/14  Sono:    Normal Antomy   Prenatal History/Complications: H/o C-section x 1, VBAC x 2 following H/o PTD with c-section at 33 wks for placental abruption UTI  Past Medical History: Past Medical History  Diagnosis Date  . Medical history non-contributory     Past Surgical History: Past Surgical History  Procedure Laterality Date  . Cesarean section    . Left ovary and tube removed  2011    Obstetrical History: OB History    Gravida Para Term Preterm AB TAB SAB Ectopic Multiple Living   Social History: History   Social History  . Marital Status: Married    Spouse Name: N/A    Number of Children: N/A  . Years of Education: N/A   Social History Main Topics  . Smoking status: Never Smoker   . Smokeless tobacco: Never Used  . Alcohol Use: No  . Drug Use: No  . Sexual Activity: Yes    Birth Control/ Protection: None   Other Topics Concern  . None   Social History Narrative    Family History: Family History  Problem Relation Age of Onset  . Cancer Other     breast and ovarian ca. relation unspecified  . Hypertension Mother   . Hypertension Father   . Cancer Paternal Aunt     breast cancer  . Cancer Paternal Aunt     breast  . Cancer Maternal Grandmother    liver  . Cancer Paternal Grandfather   . Cancer Maternal Grandfather     prostate  . Premature birth Daughter     born at 49 weeks  . Diabetes Paternal Aunt     Allergies: No Known Allergies  Prescriptions prior to admission  Medication Sig Dispense Refill Last Dose  . Multiple Vitamin (MULTIVITAMIN) tablet Take 1 tablet by mouth daily.   Past Week at Unknown time     Review of Systems   All systems reviewed and negative except as stated in HPI  Blood pressure 126/68, pulse 75, temperature 97.6 F (36.4 C), temperature source Oral, resp. rate 18, currently breastfeeding. General appearance: alert, cooperative and appears stated age Lungs: clear to auscultation bilaterally Heart: regular rate and rhythm Abdomen: soft, non-tender; bowel sounds normal Pelvic: adequate Extremities: Homans sign is negative, no sign of DVT Presentation: cephalic Fetal monitoringBaseline: 145 bpm, Variability: Good {> 6 bpm), Accelerations: Reactive and Decelerations: Absent Uterine activity Irritability  Dilation: 2.5 Effacement (%): 50 Station: -3 Exam by:: Su Hilt, MD   Prenatal labs: ABO, Rh: B/POS/-- (09/30 1642) Antibody: NEG (10/29 0941) Rubella:   RPR: NON REAC (10/29 0941)  HBsAg: NEGATIVE (09/30 1642)  HIV: NONREACTIVE (10/29 0941)  GBS: NOT DETECTED (12/31  1533)  2 hr Glucola 76/95/87 Genetic screening  Too late Anatomy US WNL   Results for orders placed or performed in visit on 07/29/14 (from the past 24 hour(s))  POCT urinalysis dipstick   Collection Time: 07/29/14 11:53 AM  Result Value Ref Range   Color, UA     Clarity, UA     Glucose, UA neg    Bilirubin, UA     Ketones, UA neg    Spec Grav, UA     Blood, UA neg    pH, UA     Protein, UA neg    Urobilinogen, UA     Nitrite, UA neg    Leukocytes, UA Negative     Patient Active Problem List   Diagnosis Date Noted  . UTI (urinary tract infection) in pregnancy in second trimester 04/15/2014  . History of  preterm delivery, currently pregnant 04/10/2014  . Supervision of other normal pregnancy 10/16/2012  . Previous cesarean section complicating pregnancy, antepartum condition or complication 09/27/2012    Assessment: Kim Duran is a 29 y.o. G4P2103 at 1349w0d here for IOL.  #Labor: Plan for therapeutic rest w/ ambien overnight and initiate IOL in AM with pitocin. #Pain: Epidural #FWB: Cat I, reassuring # TOLAC w/ successful VBAC x 2 - VBAC caclulator 83%, understands risks of induction including increased risk of uterine rupture. Consent signed in outside office. #ID:  GBS neg #MOF:  Breast + bottle #MOC: Nexplanon #Circ:  Desires inpatient  Elita BooneRoberts, Larinda Herter C 07/30/2014, 1:08 AM

## 2014-07-30 NOTE — Anesthesia Preprocedure Evaluation (Signed)
Anesthesia Evaluation  Patient identified by MRN, date of birth, ID band Patient awake    Reviewed: Allergy & Precautions, H&P , Patient's Chart, lab work & pertinent test results  Airway Mallampati: II  TM Distance: >3 FB Neck ROM: full    Dental   Pulmonary  breath sounds clear to auscultation        Cardiovascular Rhythm:regular Rate:Normal     Neuro/Psych    GI/Hepatic   Endo/Other    Renal/GU      Musculoskeletal   Abdominal   Peds  Hematology   Anesthesia Other Findings   Reproductive/Obstetrics (+) Pregnancy                             Anesthesia Physical Anesthesia Plan  ASA: III  Anesthesia Plan: Epidural   Post-op Pain Management:    Induction:   Airway Management Planned:   Additional Equipment:   Intra-op Plan:   Post-operative Plan:   Informed Consent: I have reviewed the patients History and Physical, chart, labs and discussed the procedure including the risks, benefits and alternatives for the proposed anesthesia with the patient or authorized representative who has indicated his/her understanding and acceptance.     Plan Discussed with:   Anesthesia Plan Comments:         Anesthesia Quick Evaluation

## 2014-07-30 NOTE — Anesthesia Procedure Notes (Signed)
Epidural Patient location during procedure: OB Start time: 07/30/2014 2:16 PM  Staffing Anesthesiologist: Brayton CavesJACKSON, Graciella Arment Performed by: anesthesiologist   Preanesthetic Checklist Completed: patient identified, site marked, surgical consent, pre-op evaluation, timeout performed, IV checked, risks and benefits discussed and monitors and equipment checked  Epidural Patient position: sitting Prep: site prepped and draped and DuraPrep Patient monitoring: continuous pulse ox and blood pressure Approach: midline Injection technique: LOR air  Needle:  Needle type: Tuohy  Needle gauge: 17 G Needle length: 9 cm and 9 Needle insertion depth: 5 cm cm Catheter type: closed end flexible Catheter size: 19 Gauge Catheter at skin depth: 10 cm Test dose: negative  Assessment Events: blood not aspirated, injection not painful, no injection resistance, negative IV test and no paresthesia  Additional Notes Patient identified.  Risk benefits discussed including failed block, incomplete pain control, headache, nerve damage, paralysis, blood pressure changes, nausea, vomiting, reactions to medication both toxic or allergic, and postpartum back pain.  Patient expressed understanding and wished to proceed.  All questions were answered.  Sterile technique used throughout procedure and epidural site dressed with sterile barrier dressing. No paresthesia or other complications noted.The patient did not experience any signs of intravascular injection such as tinnitus or metallic taste in mouth nor signs of intrathecal spread such as rapid motor block. Please see nursing notes for vital signs.

## 2014-07-31 ENCOUNTER — Encounter (HOSPITAL_COMMUNITY): Payer: Self-pay

## 2014-07-31 LAB — RPR: RPR: NONREACTIVE

## 2014-07-31 LAB — HIV ANTIBODY (ROUTINE TESTING W REFLEX)
HIV 1/O/2 Abs-Index Value: <1 (ref <1.00)
HIV-1/HIV-2 Ab: NONREACTIVE

## 2014-07-31 NOTE — Progress Notes (Signed)
At patient's request, notified T/S resident Dr Wende MottMcKeag and asked if he would pass on patient's request  to Dr Despina HiddenEure that he speak with them tonight if possible  about son's circumcision and early scheduling tomorrow. Family lives an hour away and concerned about pending weather.  Dr Wende MottMcKeag said he would pass on message.

## 2014-07-31 NOTE — Anesthesia Postprocedure Evaluation (Signed)
  Anesthesia Post-op Note  Patient: Kim DrapeBelinda Fleischer  Procedure(s) Performed: * No procedures listed *  Patient Location: Mother/Baby  Anesthesia Type:Epidural  Level of Consciousness: awake  Airway and Oxygen Therapy: Patient Spontanous Breathing  Post-op Pain: mild  Post-op Assessment: Patient's Cardiovascular Status Stable and Respiratory Function Stable  Post-op Vital Signs: stable  Last Vitals:  Filed Vitals:   07/31/14 0547  BP: 107/66  Pulse: 66  Temp: 36.6 C  Resp: 18    Complications: No apparent anesthesia complications

## 2014-07-31 NOTE — Lactation Note (Signed)
This note was copied from the chart of Boy Herbie DrapeBelinda Kropp. Lactation Consultation Note  Ex BF 3 years.  Denies problems or concerns. States she knows how to hand express.  Mother states baby has strong latch. Provided comfort gels for further soreness and a hand pump. Mom made aware of O/P services, breastfeeding support groups, community resources, and our phone # for post-discharge questions.    Patient Name: Boy Herbie DrapeBelinda Roesler ZOXWR'UToday's Date: 07/31/2014 Reason for consult: Initial assessment   Maternal Data Has patient been taught Hand Expression?: Yes Does the patient have breastfeeding experience prior to this delivery?: Yes  Feeding Feeding Type: Breast Fed Length of feed: 20 min  LATCH Score/Interventions                      Lactation Tools Discussed/Used     Consult Status Consult Status: Follow-up Date: 08/01/14 Follow-up type: In-patient    Dahlia ByesBerkelhammer, Bernell Sigal [facility] 07/31/2014, 3:21 PM

## 2014-07-31 NOTE — Progress Notes (Signed)
Post Partum Day 1 Subjective: no complaints, up ad lib, voiding, tolerating PO and + flatus  Objective: Blood pressure 107/66, pulse 66, temperature 97.8 F (36.6 C), temperature source Oral, resp. rate 18, height 5\' 4"  (1.626 m), weight 107.502 kg (237 lb), SpO2 99 %, unknown if currently breastfeeding.  Physical Exam:  General: alert, cooperative, appears stated age and no distress Lochia: appropriate Uterine Fundus: firm Incision: n/a DVT Evaluation: No evidence of DVT seen on physical exam.   Recent Labs  07/30/14 0200  HGB 10.5*  HCT 31.2*    Assessment/Plan: Plan for discharge tomorrow, Breastfeeding, Circumcision prior to discharge and Contraception nexplanon   LOS: 2 days   Kathee DeltonMcKeag, Ian D 07/31/2014, 7:53 AM

## 2014-08-01 ENCOUNTER — Encounter: Payer: BLUE CROSS/BLUE SHIELD | Admitting: Advanced Practice Midwife

## 2014-08-01 NOTE — Discharge Summary (Signed)
Obstetric Discharge Summary Reason for Admission: onset of labor Prenatal Procedures: NST and ultrasound Intrapartum Procedures: spontaneous vaginal delivery; VBAC Postpartum Procedures: none Complications-Operative and Postpartum: none HEMOGLOBIN  Date Value Ref Range Status  07/30/2014 10.5* 12.0 - 15.0 g/dL Final  16/10/960401/31/2014 54.011.1 g/dL Final   HCT  Date Value Ref Range Status  07/30/2014 31.2* 36.0 - 46.0 % Final  08/11/2012 33 % Final    Physical Exam:  General: alert, cooperative, appears stated age, no distress and moderately obese Lochia: appropriate Uterine Fundus: firm Incision: n/a DVT Evaluation: No evidence of DVT seen on physical exam. No cords or calf tenderness. No significant calf/ankle edema.  Discharge Diagnoses: Term Pregnancy-delivered; VBAC  Discharge Information: Date: 08/01/2014 Activity: pelvic rest Diet: routine Medications: PNV Condition: stable Instructions: refer to practice specific booklet Discharge to: home Follow-up Information    [facility]. Schedule an appointment as soon as possible for a visit in 6 weeks.   Specialty:  Obstetrics and Gynecology   Contact information:   [address] [place] [id number] [phone]      Newborn Data: Live born female  Birth Weight: 8 lb 5.9 oz (3795 g) APGAR: 8, 9  Home with mother. She is breastfeeding and considering Nexplanon for contraception. Infant had circ prior to discharge.  Kathee DeltonMcKeag, Ian D 08/01/2014, 9:06 AM   I have seen and examined this patient and I agree with the above. Cam HaiSHAW, KIMBERLY CNM 9:23 AM 08/01/2014

## 2014-08-01 NOTE — Discharge Instructions (Signed)

## 2014-08-26 ENCOUNTER — Ambulatory Visit: Payer: BLUE CROSS/BLUE SHIELD | Admitting: Obstetrics and Gynecology

## 2014-08-27 ENCOUNTER — Encounter: Payer: Self-pay | Admitting: Obstetrics and Gynecology

## 2014-09-17 ENCOUNTER — Encounter: Payer: Self-pay | Admitting: Obstetrics & Gynecology

## 2014-09-17 ENCOUNTER — Ambulatory Visit (INDEPENDENT_AMBULATORY_CARE_PROVIDER_SITE_OTHER): Payer: BLUE CROSS/BLUE SHIELD | Admitting: Obstetrics & Gynecology

## 2014-09-17 ENCOUNTER — Ambulatory Visit: Payer: BLUE CROSS/BLUE SHIELD | Admitting: Obstetrics and Gynecology

## 2014-09-17 NOTE — Progress Notes (Signed)
Patient ID: Kim Duran Hitchcock, female   DOB: 1985-11-14, 29 y.o.   MRN: [id number] Subjective:     Kim Duran Platz is a 29 y.o. female who presents for a postpartum visit. She is 7 weeks postpartum following a spontaneous vaginal delivery. I have fully reviewed the prenatal and intrapartum course. The delivery was at 40 gestational weeks. Outcome: spontaneous vaginal delivery. Anesthesia: epidural. Postpartum course has been unremarkable. Baby's course has been unremarkable. Baby is feeding by breast. Bleeding staining. Bowel function is normal. Bladder function is normal. Patient is sexually active. Contraception method is condoms. Postpartum depression screening: negative.  The following portions of the patient's history were reviewed and updated as appropriate: allergies, current medications, past family history, past medical history, past social history, past surgical history and problem list.  Review of Systems Pertinent items are noted in HPI.   Objective:    BP 120/80 mmHg  Pulse 80  Wt 229 lb (103.874 kg)  General:  alert, cooperative and no distress   Breasts:    Lungs:   Heart:    Abdomen:    Vulva:  normal  Vagina: normal vagina, no discharge, exudate, lesion, or erythema  Cervix:  anteverted and no cervical motion tenderness  Corpus: normal size, contour, position, consistency, mobility, non-tender  Adnexa:  normal adnexa  Rectal Exam:         Assessment:     Normal  postpartum exam. Pap smear not done at today's visit.   Plan:    1. Contraception: wants nexplanon 2. Follow up 1 week for placement 3. Follow up in: 1 week or as needed.

## 2014-09-24 ENCOUNTER — Ambulatory Visit: Payer: Self-pay | Admitting: Obstetrics & Gynecology

## 2014-09-24 ENCOUNTER — Telehealth: Payer: Self-pay | Admitting: *Deleted

## 2014-09-24 MED ORDER — NORETHINDRONE 0.35 MG PO TABS: 1.0000 | ORAL_TABLET | Freq: Every day | ORAL | Status: DC

## 2014-09-24 NOTE — Telephone Encounter (Signed)
Pt states does not want to get the nexplanon would like Dr. Despina HiddenEure to prescribe birth control pill. Pt is breastfeeding.

## 2015-06-29 ENCOUNTER — Emergency Department (HOSPITAL_COMMUNITY)
Admission: EM | Admit: 2015-06-29 | Discharge: 2015-06-30 | Disposition: A | Discharge status: Home / Self Care | Payer: BLUE CROSS/BLUE SHIELD | Attending: Emergency Medicine | Admitting: Emergency Medicine

## 2015-06-29 ENCOUNTER — Encounter (HOSPITAL_COMMUNITY): Payer: Self-pay | Admitting: Emergency Medicine

## 2015-06-29 ENCOUNTER — Emergency Department (HOSPITAL_COMMUNITY): Payer: BLUE CROSS/BLUE SHIELD

## 2015-06-29 DIAGNOSIS — Z3202 Encounter for pregnancy test, result negative: Secondary | ICD-10-CM | POA: Diagnosis not present

## 2015-06-29 DIAGNOSIS — Z79899 Other long term (current) drug therapy: Secondary | ICD-10-CM | POA: Insufficient documentation

## 2015-06-29 DIAGNOSIS — R079 Chest pain, unspecified: Secondary | ICD-10-CM

## 2015-06-29 DIAGNOSIS — R0602 Shortness of breath: Secondary | ICD-10-CM | POA: Diagnosis not present

## 2015-06-29 LAB — CBC WITH DIFFERENTIAL/PLATELET
BASOS PCT: 0 %
Basophils Absolute: 0 10*3/uL (ref 0.0–0.1)
Eosinophils Absolute: 0.2 10*3/uL (ref 0.0–0.7)
Eosinophils Relative: 2 %
HEMATOCRIT: 35.2 % — AB (ref 36.0–46.0)
Hemoglobin: 11.6 g/dL — ABNORMAL LOW (ref 12.0–15.0)
LYMPHS ABS: 2.2 10*3/uL (ref 0.7–4.0)
Lymphocytes Relative: 27 %
MCH: 24.8 pg — AB (ref 26.0–34.0)
MCHC: 33 g/dL (ref 30.0–36.0)
MCV: 75.4 fL — ABNORMAL LOW (ref 78.0–100.0)
MONO ABS: 0.7 10*3/uL (ref 0.1–1.0)
MONOS PCT: 9 %
NEUTROS ABS: 5.2 10*3/uL (ref 1.7–7.7)
Neutrophils Relative %: 63 %
Platelets: 342 10*3/uL (ref 150–400)
RBC: 4.67 MIL/uL (ref 3.87–5.11)
RDW: 16.7 % — AB (ref 11.5–15.5)
WBC: 8.4 10*3/uL (ref 4.0–10.5)

## 2015-06-29 LAB — D-DIMER, QUANTITATIVE: D-Dimer, Quant: 0.35 ug/mL-FEU (ref 0.00–0.50)

## 2015-06-29 NOTE — ED Provider Notes (Addendum)
TIME SEEN: 11:20 AM  CHIEF COMPLAINT: Chest pain  HPI: Pt is a 29 y.o. female with no significant past medical history who presents to the emergency department with 3 weeks of constant waxing and waning chest pain that she describes as a tightness. At times it radiates down her left arm. She has had shortness of breath. No nausea, vomiting, dizziness, diaphoresis. No fever or cough. No lower extremity swelling or pain. She does not notice any aggravating or relieving factors.  No history of PE, DVT, exogenous estrogen use, fracture, surgery, trauma, hospitalization, prolonged travel. No lower extremity swelling or pain. No calf tenderness. No family history of premature CAD. She states that she has had muscle spasms in her chest before and states this does not feel the same. She states that she thinks that this could be anxiety but has never had panic attacks before. She has never been a smoker.  ROS: See HPI Constitutional: no fever  Eyes: no drainage  ENT: no runny nose   Cardiovascular:  no chest pain  Resp: no SOB  GI: no vomiting GU: no dysuria Integumentary: no rash  Allergy: no hives  Musculoskeletal: no leg swelling  Neurological: no slurred speech ROS otherwise negative  PAST MEDICAL HISTORY/PAST SURGICAL HISTORY:  Past Medical History  Diagnosis Date  . Medical history non-contributory     MEDICATIONS:  Prior to Admission medications   Medication Sig Start Date End Date Taking? Authorizing Provider  ibuprofen (ADVIL,MOTRIN) 200 MG tablet Take 400 mg by mouth every 6 (six) hours as needed for mild pain.   Yes Historical Provider, MD  Multiple Vitamin (MULTIVITAMIN) tablet Take 1 tablet by mouth daily.   Yes Historical Provider, MD  norethindrone (MICRONOR,CAMILA,ERRIN) 0.35 MG tablet Take 1 tablet (0.35 mg total) by mouth daily. Take 1 a day 09/24/14   Lazaro ArmsLuther H Eure, MD    ALLERGIES:  No Known Allergies  SOCIAL HISTORY:  Social History  Substance Use Topics  . Smoking  status: Never Smoker   . Smokeless tobacco: Never Used  . Alcohol Use: No    FAMILY HISTORY: Family History  Problem Relation Age of Onset  . Cancer Other     breast and ovarian ca. relation unspecified  . Hypertension Mother   . Hypertension Father   . Cancer Paternal Aunt     breast cancer  . Cancer Paternal Aunt     breast  . Cancer Maternal Grandmother     liver  . Cancer Paternal Grandfather   . Cancer Maternal Grandfather     prostate  . Premature birth Daughter     born at 633 weeks  . Diabetes Paternal Aunt     EXAM: BP 136/90 mmHg  Pulse 75  Resp 20  Ht 5\' 4"  (1.626 m)  Wt 240 lb (108.863 kg)  BMI 41.18 kg/m2  LMP 06/15/2015 CONSTITUTIONAL: Alert and oriented and responds appropriately to questions. Well-appearing; well-nourished HEAD: Normocephalic EYES: Conjunctivae clear, PERRL ENT: normal nose; no rhinorrhea; moist mucous membranes; pharynx without lesions noted NECK: Supple, no meningismus, no LAD  CARD: RRR; S1 and S2 appreciated; no murmurs, no clicks, no rubs, no gallops RESP: Normal chest excursion without splinting or tachypnea; breath sounds clear and equal bilaterally; no wheezes, no rhonchi, no rales, no hypoxia or respiratory distress, speaking full sentences CHEST:  Nontender to palpation without crepitus, ecchymosis or deformity ABD/GI: Normal bowel sounds; non-distended; soft, non-tender, no rebound, no guarding, no peritoneal signs BACK:  The back appears normal and is  non-tender to palpation, there is no CVA tenderness EXT: Normal ROM in all joints; non-tender to palpation; no edema; normal capillary refill; no cyanosis, no calf tenderness or swelling    SKIN: Normal color for age and race; warm NEURO: Moves all extremities equally, sensation to light touch intact diffusely, cranial nerves II through XII intact PSYCH: The patient's mood and manner are appropriate. Grooming and personal hygiene are appropriate.  MEDICAL DECISION MAKING:  Patient here with chest pain, shortness of breath. Pain is not reproduced with palpation of her chest wall. She is hemodynamically stable. Differential diagnosis includes anxiety, less likely ACS or pulmonary embolus, dissection. Will obtain labs, chest x-ray. She declines any pain medication at this time. She does not have a primary care for follow-up.  ED PROGRESS: Patient's labs are unremarkable including negative troponin and negative d-dimer. Chest x-ray is clear. Urine pregnancy test negative. Still hemodynamically stable. Discussed with patient that this could be anxiety but I do not think this is any life-threatening process and I feel she is safe to be discharged home. Discussed return precautions. We'll provide her with outpatient resources. She verbalized understanding and is comfortable with this plan.    EKG Interpretation  Date/Time:  Sunday June 29 2015 21:05:04 EST Ventricular Rate:  69 PR Interval:  140 QRS Duration: 86 QT Interval:  386 QTC Calculation: 413 R Axis:   52 Text Interpretation:  Normal sinus rhythm with sinus arrhythmia Normal ECG No old tracing to compare Confirmed by WARD,  DO, KRISTEN [phone]) on 06/29/2015 11:08:02 PM        Layla Maw Ward, DO 06/30/15 0104    Patient agrees that this is likely anxiety. She is currently breast-feeding therefore do not recommend benzodiazepines and she agrees with this. She will follow-up with outpatient provider. She states she [facility] her [facility] to [facility] and can follow up with a physician there. Have provided her with this information.  Layla Maw Ward, DO 06/30/15 [phone]

## 2015-06-29 NOTE — ED Notes (Signed)
Pt does not want pain meds at this time.

## 2015-06-29 NOTE — ED Notes (Signed)
Pt states she has been having chest pain off and on for about 3 weeks but that today it was worse with radiation down L. Arm.

## 2015-06-30 LAB — TROPONIN I: Troponin I: <0.03 ng/mL (ref <0.031)

## 2015-06-30 LAB — BASIC METABOLIC PANEL
Anion gap: 7 (ref 5–15)
BUN: 13 mg/dL (ref 6–20)
CALCIUM: 9.5 mg/dL (ref 8.9–10.3)
CO2: 29 mmol/L (ref 22–32)
CREATININE: 0.72 mg/dL (ref 0.44–1.00)
Chloride: 105 mmol/L (ref 101–111)
GFR calc non Af Amer: >60 mL/min (ref >60)
GLUCOSE: 84 mg/dL (ref 65–99)
Potassium: 3.9 mmol/L (ref 3.5–5.1)
Sodium: 141 mmol/L (ref 135–145)

## 2015-06-30 LAB — POC URINE PREG, ED: PREG TEST UR: NEGATIVE

## 2015-06-30 NOTE — Discharge Instructions (Signed)
Nonspecific Chest Pain  °Chest pain can be caused by many different conditions. There is always a chance that your pain could be related to something serious, such as a heart attack or a blood clot in your lungs. Chest pain can also be caused by conditions that are not life-threatening. If you have chest pain, it is very important to follow up with your health care provider. °CAUSES  °Chest pain can be caused by: °· Heartburn. °· Pneumonia or bronchitis. °· Anxiety or stress. °· Inflammation around your heart (pericarditis) or lung (pleuritis or pleurisy). °· A blood clot in your lung. °· A collapsed lung (pneumothorax). It can develop suddenly on its own (spontaneous pneumothorax) or from trauma to the chest. °· Shingles infection (varicella-zoster virus). °· Heart attack. °· Damage to the bones, muscles, and cartilage that make up your chest wall. This can include: °· Bruised bones due to injury. °· Strained muscles or cartilage due to frequent or repeated coughing or overwork. °· Fracture to one or more ribs. °· Sore cartilage due to inflammation (costochondritis). °RISK FACTORS  °Risk factors for chest pain may include: °· Activities that increase your risk for trauma or injury to your chest. °· Respiratory infections or conditions that cause frequent coughing. °· Medical conditions or overeating that can cause heartburn. °· Heart disease or family history of heart disease. °· Conditions or health behaviors that increase your risk of developing a blood clot. °· Having had chicken pox (varicella zoster). °SIGNS AND SYMPTOMS °Chest pain can feel like: °· Burning or tingling on the surface of your chest or deep in your chest. °· Crushing, pressure, aching, or squeezing pain. °· Dull or sharp pain that is worse when you move, cough, or take a deep breath. °· Pain that is also felt in your back, neck, shoulder, or arm, or pain that spreads to any of these areas. °Your chest pain may come and go, or it may stay  constant. °DIAGNOSIS °Lab tests or other studies may be needed to find the cause of your pain. Your health care provider may have you take a test called an ambulatory ECG (electrocardiogram). An ECG records your heartbeat patterns at the time the test is performed. You may also have other tests, such as: °· Transthoracic echocardiogram (TTE). During echocardiography, sound waves are used to create a picture of all of the heart structures and to look at how blood flows through your heart. °· Transesophageal echocardiogram (TEE). This is a more advanced imaging test that obtains images from inside your body. It allows your health care provider to see your heart in finer detail. °· Cardiac monitoring. This allows your health care provider to monitor your heart rate and rhythm in real time. °· Holter monitor. This is a portable device that records your heartbeat and can help to diagnose abnormal heartbeats. It allows your health care provider to track your heart activity for several days, if needed. °· Stress tests. These can be done through exercise or by taking medicine that makes your heart beat more quickly. °· Blood tests. °· Imaging tests. °TREATMENT  °Your treatment depends on what is causing your chest pain. Treatment may include: °· Medicines. These may include: °· Acid blockers for heartburn. °· Anti-inflammatory medicine. °· Pain medicine for inflammatory conditions. °· Antibiotic medicine, if an infection is present. °· Medicines to dissolve blood clots. °· Medicines to treat coronary artery disease. °· Supportive care for conditions that do not require medicines. This may include: °· Resting. °· Applying heat   or cold packs to injured areas. °· Limiting activities until pain decreases. °HOME CARE INSTRUCTIONS °· If you were prescribed an antibiotic medicine, finish it all even if you start to feel better. °· Avoid any activities that bring on chest pain. °· Do not use any tobacco products, including  cigarettes, chewing tobacco, or electronic cigarettes. If you need help quitting, ask your health care provider. °· Do not drink alcohol. °· Take medicines only as directed by your health care provider. °· Keep all follow-up visits as directed by your health care provider. This is important. This includes any further testing if your chest pain does not go away. °· If heartburn is the cause for your chest pain, you may be told to keep your head raised (elevated) while sleeping. This reduces the chance that acid will go from your stomach into your esophagus. °· Make lifestyle changes as directed by your health care provider. These may include: °· Getting regular exercise. Ask your health care provider to suggest some activities that are safe for you. °· Eating a heart-healthy diet. A registered dietitian can help you to learn healthy eating options. °· Maintaining a healthy weight. °· Managing diabetes, if necessary. °· Reducing stress. °SEEK MEDICAL CARE IF: °· Your chest pain does not go away after treatment. °· You have a rash with blisters on your chest. °· You have a fever. °SEEK IMMEDIATE MEDICAL CARE IF:  °· Your chest pain is worse. °· You have an increasing cough, or you cough up blood. °· You have severe abdominal pain. °· You have severe weakness. °· You faint. °· You have chills. °· You have sudden, unexplained chest discomfort. °· You have sudden, unexplained discomfort in your arms, back, neck, or jaw. °· You have shortness of breath at any time. °· You suddenly start to sweat, or your skin gets clammy. °· You feel nauseous or you vomit. °· You suddenly feel light-headed or dizzy. °· Your heart begins to beat quickly, or it feels like it is skipping beats. °These symptoms may represent a serious problem that is an emergency. Do not wait to see if the symptoms will go away. Get medical help right away. Call your local emergency services (911 in the [place] Do not drive yourself to the hospital. °  °This  information is not intended to replace advice given to you by your health care provider. Make sure you discuss any questions you have with your health care provider. °  °Document Released: 04/07/2005 Document Revised: 07/19/2014 Document Reviewed: 02/01/2014 °Elsevier Interactive Patient Education ©2016 [employer] ° °Panic Attacks °Panic attacks are sudden, short-lived surges of severe anxiety, fear, or discomfort. They may occur for no reason when you are relaxed, when you are anxious, or when you are sleeping. Panic attacks may occur for a number of reasons:  °· Healthy people occasionally have panic attacks in extreme, life-threatening situations, such as war or natural disasters. Normal anxiety is a protective mechanism of the body that helps us react to danger (fight or flight response). °· Panic attacks are often seen with anxiety disorders, such as panic disorder, social anxiety disorder, generalized anxiety disorder, and phobias. Anxiety disorders cause excessive or uncontrollable anxiety. They may interfere with your relationships or other life activities. °· Panic attacks are sometimes seen with other mental illnesses, such as depression and posttraumatic stress disorder. °· Certain medical conditions, prescription medicines, and drugs of abuse can cause panic attacks. °SYMPTOMS  °Panic attacks start suddenly, peak within 20 minutes, and are   accompanied by four or more of the following symptoms: °· Pounding heart or fast heart rate (palpitations). °· Sweating. °· Trembling or shaking. °· Shortness of breath or feeling smothered. °· Feeling choked. °· Chest pain or discomfort. °· Nausea or strange feeling in your stomach. °· Dizziness, light-headedness, or feeling like you will faint. °· Chills or hot flushes. °· Numbness or tingling in your lips or hands and feet. °· Feeling that things are not real or feeling that you are not yourself. °· Fear of losing control or going crazy. °· Fear of dying. °Some  of these symptoms can mimic serious medical conditions. For example, you may think you are having a heart attack. Although panic attacks can be very scary, they are not life threatening. °DIAGNOSIS  °Panic attacks are diagnosed through an assessment by your health care provider. Your health care provider will ask questions about your symptoms, such as where and when they occurred. Your health care provider will also ask about your medical history and use of alcohol and drugs, including prescription medicines. Your health care provider may order blood tests or other studies to rule out a serious medical condition. Your health care provider may refer you to a mental health professional for further evaluation. °TREATMENT  °· Most healthy people who have one or two panic attacks in an extreme, life-threatening situation will not require treatment. °· The treatment for panic attacks associated with anxiety disorders or other mental illness typically involves counseling with a mental health professional, medicine, or a combination of both. Your health care provider will help determine what treatment is best for you. °· Panic attacks due to physical illness usually go away with treatment of the illness. If prescription medicine is causing panic attacks, talk with your health care provider about stopping the medicine, decreasing the dose, or substituting another medicine. °· Panic attacks due to alcohol or drug abuse go away with abstinence. Some adults need professional help in order to stop drinking or using drugs. °HOME CARE INSTRUCTIONS  °· Take all medicines as directed by your health care provider.   °· Schedule and attend follow-up visits as directed by your health care provider. It is important to keep all your appointments. °SEEK MEDICAL CARE IF: °· You are not able to take your medicines as prescribed. °· Your symptoms do not improve or get worse. °SEEK IMMEDIATE MEDICAL CARE IF:  °· You experience panic attack  symptoms that are different than your usual symptoms. °· You have serious thoughts about hurting yourself or others. °· You are taking medicine for panic attacks and have a serious side effect. °MAKE SURE YOU: °· Understand these instructions. °· Will watch your condition. °· Will get help right away if you are not doing well or get worse. °  °This information is not intended to replace advice given to you by your health care provider. Make sure you discuss any questions you have with your health care provider. °  °Document Released: 06/28/2005 Document Revised: 07/03/2013 Document Reviewed: 02/09/2013 °Elsevier Interactive Patient Education ©2016 [employer] ° °

## 2015-08-24 ENCOUNTER — Encounter (HOSPITAL_COMMUNITY): Payer: Self-pay | Admitting: Emergency Medicine

## 2015-08-24 ENCOUNTER — Emergency Department (HOSPITAL_COMMUNITY)
Admission: EM | Admit: 2015-08-24 | Discharge: 2015-08-24 | Disposition: A | Discharge status: Home / Self Care | Payer: BLUE CROSS/BLUE SHIELD | Attending: Emergency Medicine | Admitting: Emergency Medicine

## 2015-08-24 DIAGNOSIS — J029 Acute pharyngitis, unspecified: Secondary | ICD-10-CM

## 2015-08-24 DIAGNOSIS — M791 Myalgia: Secondary | ICD-10-CM | POA: Insufficient documentation

## 2015-08-24 DIAGNOSIS — Z79899 Other long term (current) drug therapy: Secondary | ICD-10-CM | POA: Diagnosis not present

## 2015-08-24 DIAGNOSIS — R51 Headache: Secondary | ICD-10-CM | POA: Diagnosis not present

## 2015-08-24 MED ORDER — PSEUDOEPHEDRINE HCL 60 MG PO TABS: 60.0000 mg | ORAL_TABLET | ORAL | Status: DC | PRN

## 2015-08-24 NOTE — Discharge Instructions (Signed)

## 2015-08-24 NOTE — ED Notes (Signed)
Patient c/o sore throat, headaches, congested cough, and body aches. Per patient started with fevers x2 days but has stopped. Denies any nausea or vomiting. Patient taking tylenol and motrin at home, last medicated with tylenol at 7pm yesterday.

## 2015-08-24 NOTE — ED Provider Notes (Signed)
CSN: [id number]     Arrival date & time 08/24/15  1030 History  By signing my name below, I, Ronney Lion, attest that this documentation has been prepared under the direction and in the presence of Katrese Shell, PA-C. Electronically Signed: Ronney Lion, ED Scribe. 08/24/2015. 12:20 PM.    Chief Complaint  Patient presents with  . Sore Throat   The history is provided by the patient. No language interpreter was used.    HPI Comments: Kim Duran is a 30 y.o. female with no pertinent PMHx, who presents to the Emergency Department with multiple URI-type complaints, including a sore throat, congestion, headaches, cough, and myalgias that began 4 days ago. Patient also notes a fever 2 days ago that has since resolved. Patient is currently checked in her with her entire household, who has similar symptoms, and patient states she herself seemed to have the flu last week, but it seems to be improving. She states her husband made her come in "just to be checked out," but also requests medication to alleviate congestion. Patient had been taking Tylenol Cold and Flu at home with mild relief. Patient is currently breastfeeding.   Past Medical History  Diagnosis Date  . Medical history non-contributory    Past Surgical History  Procedure Laterality Date  . Cesarean section    . Left ovary and tube removed  2011   Family History  Problem Relation Age of Onset  . Cancer Other     breast and ovarian ca. relation unspecified  . Hypertension Mother   . Hypertension Father   . Cancer Paternal Aunt     breast cancer  . Cancer Paternal Aunt     breast  . Cancer Maternal Grandmother     liver  . Cancer Paternal Grandfather   . Cancer Maternal Grandfather     prostate  . Premature birth Daughter     born at 65 weeks  . Diabetes Paternal Aunt    Social History  Substance Use Topics  . Smoking status: Never Smoker   . Smokeless tobacco: Never Used  . Alcohol Use: No   OB History    Gravida Para Term Preterm AB TAB SAB Ectopic Multiple Living   0 4     Review of Systems  Constitutional: Positive for fever (resolved).  HENT: Positive for congestion and sore throat.   Respiratory: Positive for cough.   Musculoskeletal: Positive for myalgias (generalized).  Neurological: Positive for headaches.   Allergies  Review of patient's allergies indicates no known allergies.  Home Medications   Prior to Admission medications   Medication Sig Start Date End Date Taking? Authorizing Provider  acetaminophen (TYLENOL) 500 MG tablet Take 1,000 mg by mouth 2 (two) times daily as needed for moderate pain or fever.   Yes Historical Provider, MD  ibuprofen (ADVIL,MOTRIN) 200 MG tablet Take 400 mg by mouth every 6 (six) hours as needed for mild pain.   Yes Historical Provider, MD  Multiple Vitamin (MULTIVITAMIN) tablet Take 1 tablet by mouth daily.   Yes Historical Provider, MD  norethindrone (MICRONOR,CAMILA,ERRIN) 0.35 MG tablet Take 1 tablet (0.35 mg total) by mouth daily. Take 1 a day Patient not taking: Reported on 08/24/2015 09/24/14   Lazaro Arms, MD   BP 140/100 mmHg  Pulse 90  Temp(Src) 98.8 F (37.1 C) (Oral)  Resp 16  Ht  (1.626 m)  Wt 234 lb (106.142 kg)  BMI 40.15 kg/m2  SpO2 9%  LMP 08/24/2015 Physical Exam  Constitutional: She is oriented to person, place, and time. She appears well-developed and well-nourished. No distress.  HENT:  Head: Normocephalic and atraumatic.  Mouth/Throat: Oropharynx is clear and moist. No oropharyngeal exudate.  Throat is mildly erythematous.  Eyes: Conjunctivae and EOM are normal.  Neck: Neck supple. No tracheal deviation present.  Cardiovascular: Normal rate.   Pulmonary/Chest: Effort normal and breath sounds normal. No respiratory distress. She has no wheezes. She has no rales. She exhibits no tenderness.  Musculoskeletal: Normal range of motion.  Neurological: She is alert and oriented to person, place, and  time.  Skin: Skin is warm and dry.  Psychiatric: She has a normal mood and affect. Her behavior is normal.  Nursing note and vitals reviewed.   ED Course  Procedures (including critical care time)  DIAGNOSTIC STUDIES:  Oxygen Saturation is 99% on RA, normal by my interpretation.    COORDINATION OF CARE: 12:03 PM - Discussed treatment plan with pt at bedside which includes chloraseptic spray and Mucinex. Pt verbalized understanding and agreed to plan.   MDM   Final diagnoses:  Pharyngitis   Pt is well appearing.  Vitals stable, non-toxic.  Other family members with similar sx's.  Likely viral process.  Pt agrees to symptomatic tx .    I personally performed the services described in this documentation, which was scribed in my presence. The recorded information has been reviewed and is accurate.     Pauline Aus, PA-C 08/24/15 2038  Margarita Grizzle, MD 08/26/15 [phone]

## 2016-04-15 DIAGNOSIS — M79602 Pain in left arm: Secondary | ICD-10-CM | POA: Diagnosis not present

## 2016-05-10 DIAGNOSIS — G8929 Other chronic pain: Secondary | ICD-10-CM | POA: Diagnosis not present

## 2016-05-10 DIAGNOSIS — Z0001 Encounter for general adult medical examination with abnormal findings: Secondary | ICD-10-CM | POA: Diagnosis not present

## 2016-05-10 DIAGNOSIS — M25512 Pain in left shoulder: Secondary | ICD-10-CM | POA: Diagnosis not present

## 2016-05-10 DIAGNOSIS — Z1231 Encounter for screening mammogram for malignant neoplasm of breast: Secondary | ICD-10-CM | POA: Diagnosis not present

## 2016-08-12 DIAGNOSIS — M25511 Pain in right shoulder: Secondary | ICD-10-CM | POA: Diagnosis not present

## 2016-08-12 DIAGNOSIS — M25512 Pain in left shoulder: Secondary | ICD-10-CM | POA: Diagnosis not present

## 2016-08-12 DIAGNOSIS — M7551 Bursitis of right shoulder: Secondary | ICD-10-CM | POA: Diagnosis not present

## 2016-08-12 DIAGNOSIS — M7552 Bursitis of left shoulder: Secondary | ICD-10-CM | POA: Diagnosis not present

## 2016-09-27 DIAGNOSIS — Z1231 Encounter for screening mammogram for malignant neoplasm of breast: Secondary | ICD-10-CM | POA: Diagnosis not present

## 2017-03-18 ENCOUNTER — Emergency Department (HOSPITAL_COMMUNITY)
Admission: EM | Admit: 2017-03-18 | Discharge: 2017-03-18 | Disposition: A | Discharge status: Home / Self Care | Payer: BLUE CROSS/BLUE SHIELD | Attending: Emergency Medicine | Admitting: Emergency Medicine

## 2017-03-18 ENCOUNTER — Emergency Department (HOSPITAL_COMMUNITY): Payer: BLUE CROSS/BLUE SHIELD

## 2017-03-18 ENCOUNTER — Encounter (HOSPITAL_COMMUNITY): Payer: Self-pay | Admitting: Emergency Medicine

## 2017-03-18 DIAGNOSIS — R1084 Generalized abdominal pain: Secondary | ICD-10-CM | POA: Diagnosis not present

## 2017-03-18 DIAGNOSIS — R102 Pelvic and perineal pain: Secondary | ICD-10-CM | POA: Diagnosis not present

## 2017-03-18 DIAGNOSIS — K439 Ventral hernia without obstruction or gangrene: Secondary | ICD-10-CM | POA: Diagnosis not present

## 2017-03-18 LAB — CBC WITH DIFFERENTIAL/PLATELET
BASOS PCT: 0 %
Basophils Absolute: 0 10*3/uL (ref 0.0–0.1)
EOS ABS: 0.1 10*3/uL (ref 0.0–0.7)
EOS PCT: 2 %
HCT: 34.4 % — ABNORMAL LOW (ref 36.0–46.0)
Hemoglobin: 11.5 g/dL — ABNORMAL LOW (ref 12.0–15.0)
Lymphocytes Relative: 26 %
Lymphs Abs: 2.2 10*3/uL (ref 0.7–4.0)
MCH: 23.7 pg — ABNORMAL LOW (ref 26.0–34.0)
MCHC: 33.4 g/dL (ref 30.0–36.0)
MCV: 70.9 fL — ABNORMAL LOW (ref 78.0–100.0)
MONO ABS: 0.8 10*3/uL (ref 0.1–1.0)
MONOS PCT: 10 %
Neutro Abs: 5.2 10*3/uL (ref 1.7–7.7)
Neutrophils Relative %: 62 %
PLATELETS: 377 10*3/uL (ref 150–400)
RBC: 4.85 MIL/uL (ref 3.87–5.11)
RDW: 18.2 % — AB (ref 11.5–15.5)
WBC: 8.3 10*3/uL (ref 4.0–10.5)

## 2017-03-18 LAB — COMPREHENSIVE METABOLIC PANEL
ALK PHOS: 56 U/L (ref 38–126)
ALT: 13 U/L — AB (ref 14–54)
AST: 15 U/L (ref 15–41)
Albumin: 4.2 g/dL (ref 3.5–5.0)
Anion gap: 7 (ref 5–15)
BUN: 10 mg/dL (ref 6–20)
CALCIUM: 9.8 mg/dL (ref 8.9–10.3)
CHLORIDE: 108 mmol/L (ref 101–111)
CO2: 25 mmol/L (ref 22–32)
CREATININE: 0.73 mg/dL (ref 0.44–1.00)
GFR calc Af Amer: >60 mL/min (ref >60)
GLUCOSE: 83 mg/dL (ref 65–99)
Potassium: 4 mmol/L (ref 3.5–5.1)
SODIUM: 140 mmol/L (ref 135–145)
TOTAL PROTEIN: 8.3 g/dL — AB (ref 6.5–8.1)
Total Bilirubin: 0.5 mg/dL (ref 0.3–1.2)

## 2017-03-18 LAB — URINALYSIS, ROUTINE W REFLEX MICROSCOPIC
BILIRUBIN URINE: NEGATIVE
GLUCOSE, UA: NEGATIVE mg/dL
HGB URINE DIPSTICK: NEGATIVE
KETONES UR: NEGATIVE mg/dL
Leukocytes, UA: NEGATIVE
Nitrite: NEGATIVE
PH: 5 (ref 5.0–8.0)
PROTEIN: NEGATIVE mg/dL
Specific Gravity, Urine: 1.024 (ref 1.005–1.030)

## 2017-03-18 LAB — WET PREP, GENITAL
Clue Cells Wet Prep HPF POC: NONE SEEN
SPERM: NONE SEEN
Trich, Wet Prep: NONE SEEN
YEAST WET PREP: NONE SEEN

## 2017-03-18 LAB — LIPASE, BLOOD: LIPASE: 31 U/L (ref 11–51)

## 2017-03-18 LAB — PREGNANCY, URINE: Preg Test, Ur: NEGATIVE

## 2017-03-18 MED ORDER — NAPROXEN 250 MG PO TABS: 250.0000 mg | ORAL_TABLET | Freq: Two times a day (BID) | ORAL | 0 refills | Status: DC | PRN

## 2017-03-18 NOTE — ED Triage Notes (Signed)
Patient complains of lower abdominal pain that started 3 weeks ago immediately after last pregnancy. Denies nausea, vomiting, diarrhea.

## 2017-03-18 NOTE — Discharge Instructions (Signed)
Take the prescription as directed.  Your ultrasound showed an incidental finding:  "Endometrial thickness of 21 mm which is considered abnormal. Consider follow-up by US in 6-8 weeks, during the week immediately following menses (exam timing is critical)."  Call your regular OB/GYN doctor on Monday to schedule a follow up appointment within the next week for this finding.  Return to the Emergency Department immediately sooner if worsening.

## 2017-03-18 NOTE — ED Notes (Signed)
To CT

## 2017-03-18 NOTE — ED Provider Notes (Signed)
[facility] DEPT Provider Note   CSN: [id number] Arrival date & time: 03/18/17  1344     History   Chief Complaint Chief Complaint  Patient presents with  . Pelvic Pain    HPI Kim Duran is a 31 y.o. female.  HPI Pt was seen at 1435.  Per pt, c/o gradual onset and persistence of constant generalized pelvic pain" for the past 3 weeks. States the pain began after her last menses.  Describes the abd pain as "cramping." Has been associated with no other symptoms.  Denies N/V, no diarrhea, no fevers, no back pain, no rash, no CP/SOB, no black or blood in stools, no dysuria/hematuria, no vaginal bleeding/discharge.      Past Medical History:  Diagnosis Date  . Medical history non-contributory     Patient Active Problem List   Diagnosis Date Noted  . Term pregnancy 07/30/2014  . UTI (urinary tract infection) in pregnancy in second trimester 04/15/2014  . History of preterm delivery, currently pregnant 04/10/2014  . Supervision of other normal pregnancy 10/16/2012  . Previous cesarean section complicating pregnancy, antepartum condition or complication 09/27/2012    Past Surgical History:  Procedure Laterality Date  . CESAREAN SECTION    . left ovary and tube removed  2011    OB History    Gravida Para Term Preterm AB Living   SAB TAB Ectopic Multiple Live Births         0 4       Home Medications    Prior to Admission medications   Medication Sig Start Date End Date Taking? Authorizing Provider  acetaminophen (TYLENOL) 500 MG tablet Take 1,000 mg by mouth 2 (two) times daily as needed for moderate pain or fever.    [provider]  ibuprofen (ADVIL,MOTRIN) 200 MG tablet Take 400 mg by mouth every 6 (six) hours as needed for mild pain.    [provider]  Multiple Vitamin (MULTIVITAMIN) tablet Take 1 tablet by mouth daily.    [provider]  norethindrone (MICRONOR,CAMILA,ERRIN) 0.35 MG tablet Take 1 tablet (0.35 mg  total) by mouth daily. Take 1 a day Patient not taking: Reported on 08/24/2015 09/24/14   Lazaro Arms, MD  pseudoephedrine (SUDAFED) 60 MG tablet Take 1 tablet (60 mg total) by mouth every 4 (four) hours as needed for congestion. 08/24/15   Pauline Aus, PA-C    Family History Family History  Problem Relation Age of Onset  . Hypertension Mother   . Hypertension Father   . Cancer Paternal Aunt        breast cancer  . Cancer Paternal Aunt        breast  . Cancer Maternal Grandmother        liver  . Cancer Paternal Grandfather   . Cancer Maternal Grandfather        prostate  . Premature birth Daughter        born at 67 weeks  . Diabetes Paternal Aunt   . Cancer Other        breast and ovarian ca. relation unspecified    Social History Social History  Substance Use Topics  . Smoking status: Never Smoker  . Smokeless tobacco: Never Used  . Alcohol use No     Allergies   Patient has no known allergies.   Review of Systems Review of Systems ROS: Statement: All systems negative except as marked or noted in the HPI; Constitutional: Negative  for fever and chills. ; ; Eyes: Negative for eye pain, redness and discharge. ; ; ENMT: Negative for ear pain, hoarseness, nasal congestion, sinus pressure and sore throat. ; ; Cardiovascular: Negative for chest pain, palpitations, diaphoresis, dyspnea and peripheral edema. ; ; Respiratory: Negative for cough, wheezing and stridor. ; ; Gastrointestinal: Negative for nausea, vomiting, diarrhea, abdominal pain, blood in stool, hematemesis, jaundice and rectal bleeding. . ; ; Genitourinary: Negative for dysuria, flank pain and hematuria. ; ; GYN:  +pelvic pain, no vaginal bleeding, no vaginal discharge, no vulvar pain. ;; Musculoskeletal: Negative for back pain and neck pain. Negative for swelling and trauma.; ; Skin: Negative for pruritus, rash, abrasions, blisters, bruising and skin lesion.; ; Neuro: Negative for headache, lightheadedness and  neck stiffness. Negative for weakness, altered level of consciousness, altered mental status, extremity weakness, paresthesias, involuntary movement, seizure and syncope.       Physical Exam Updated Vital Signs BP (!) 152/89 (BP Location: Right Arm)   Pulse 90   Temp 99.2 F (37.3 C) (Oral)   Resp 16   Ht 5\' 3"  (1.6 m)   Wt 112 kg (247 lb)   LMP 02/25/2017   SpO2 99%   BMI 43.75 kg/m   Physical Exam 1440: Physical examination:  Nursing notes reviewed; Vital signs and O2 SAT reviewed;  Constitutional: Well developed, Well nourished, Well hydrated, In no acute distress; Head:  Normocephalic, atraumatic; Eyes: EOMI, PERRL, No scleral icterus; ENMT: Mouth and pharynx normal, Mucous membranes moist; Neck: Supple, Full range of motion, No lymphadenopathy; Cardiovascular: Regular rate and rhythm, No murmur, rub, or gallop; Respiratory: Breath sounds clear & equal bilaterally, No rales, rhonchi, wheezes.  Speaking full sentences with ease, Normal respiratory effort/excursion; Chest: Nontender, Movement normal; Abdomen: Soft, Nontender, Nondistended, Normal bowel sounds; Genitourinary: No CVA tenderness.  Pelvic exam performed with permission of pt and female ED tech assist during exam.  External genitalia w/o lesions. Vaginal vault with thin clear discharge.  Cervix w/o lesions, not friable, GC/chlam and wet prep obtained and sent to lab.  Bimanual exam w/o CMT, uterine or adnexal tenderness.; Extremities: Pulses normal, No tenderness, No edema, No calf edema or asymmetry.; Neuro: AA&Ox3, Major CN grossly intact.  Speech clear. No gross focal motor or sensory deficits in extremities.; Skin: Color normal, Warm, Dry.   ED Treatments / Results  Labs (all labs ordered are listed, but only abnormal results are displayed)   EKG  EKG Interpretation None       Radiology   Procedures Procedures (including critical care time)  Medications Ordered in ED Medications - No data to  display   Initial Impression / Assessment and Plan / ED Course  I have reviewed the triage vital signs and the nursing notes.  Pertinent labs & imaging results that were available during my care of the patient were reviewed by me and considered in my medical decision making (see chart for details).  MDM Reviewed: previous chart, nursing note and vitals Reviewed previous: labs Interpretation: labs, ultrasound and CT scan   Results for orders placed or performed during the hospital encounter of 03/18/17  Lipase, blood  Result Value Ref Range   Lipase 31 11 - 51 U/L  Comprehensive metabolic panel  Result Value Ref Range   Sodium 140 135 - 145 mmol/L   Potassium 4.0 3.5 - 5.1 mmol/L   Chloride 108 101 - 111 mmol/L   CO2 25 22 - 32 mmol/L   Glucose, Bld 83 65 - 99 mg/dL  BUN 10 6 - 20 mg/dL   Creatinine, Ser 1.61 0.44 - 1.00 mg/dL   Calcium 9.8 8.9 - 09.6 mg/dL   Total Protein 8.3 (H) 6.5 - 8.1 g/dL   Albumin 4.2 3.5 - 5.0 g/dL   AST 15 15 - 41 U/L   ALT 13 (L) 14 - 54 U/L   Alkaline Phosphatase 56 38 - 126 U/L   Total Bilirubin 0.5 0.3 - 1.2 mg/dL   GFR calc non Af Amer >60 >60 mL/min   GFR calc Af Amer >60 >60 mL/min   Anion gap 7 5 - 15  Urinalysis, Routine w reflex microscopic  Result Value Ref Range   Color, Urine YELLOW YELLOW   APPearance HAZY (A) CLEAR   Specific Gravity, Urine 1.024 1.005 - 1.030   pH 5.0 5.0 - 8.0   Glucose, UA NEGATIVE NEGATIVE mg/dL   Hgb urine dipstick NEGATIVE NEGATIVE   Bilirubin Urine NEGATIVE NEGATIVE   Ketones, ur NEGATIVE NEGATIVE mg/dL   Protein, ur NEGATIVE NEGATIVE mg/dL   Nitrite NEGATIVE NEGATIVE   Leukocytes, UA NEGATIVE NEGATIVE  CBC with Differential  Result Value Ref Range   WBC 8.3 4.0 - 10.5 K/uL   RBC 4.85 3.87 - 5.11 MIL/uL   Hemoglobin 11.5 (L) 12.0 - 15.0 g/dL   HCT 04.5 (L) 40.9 - 81.1 %   MCV 70.9 (L) 78.0 - 100.0 fL   MCH 23.7 (L) 26.0 - 34.0 pg   MCHC 33.4 30.0 - 36.0 g/dL   RDW 91.4 (H) 78.2 - 95.6 %    Platelets 377 150 - 400 K/uL   Neutrophils Relative % 62 %   Neutro Abs 5.2 1.7 - 7.7 K/uL   Lymphocytes Relative 26 %   Lymphs Abs 2.2 0.7 - 4.0 K/uL   Monocytes Relative 10 %   Monocytes Absolute 0.8 0.1 - 1.0 K/uL   Eosinophils Relative 2 %   Eosinophils Absolute 0.1 0.0 - 0.7 K/uL   Basophils Relative 0 %   Basophils Absolute 0.0 0.0 - 0.1 K/uL  Pregnancy, urine  Result Value Ref Range   Preg Test, Ur NEGATIVE NEGATIVE   US Pelvis Complete Result Date: 03/18/2017 CLINICAL DATA:  Pelvic pain for 3 weeks. Prior Cesarean section and left oophorectomy. EXAM: TRANSABDOMINAL AND TRANSVAGINAL ULTRASOUND OF PELVIS DOPPLER ULTRASOUND OF OVARIES TECHNIQUE: Both transabdominal and transvaginal ultrasound examinations of the pelvis were performed. Transabdominal technique was performed for global imaging of the pelvis including uterus, ovaries, adnexal regions, and pelvic cul-de-sac. It was necessary to proceed with endovaginal exam following the transabdominal exam to visualize the endometrium and right ovary. Color and duplex Doppler ultrasound was utilized to evaluate blood flow to the ovaries. COMPARISON:  None. FINDINGS: Uterus Measurements: 9.8 x 5.2 x 5.6 cm. No fibroids or other mass visualized. Endometrium Thickness: 21 mm.  No focal abnormality visualized. Right ovary Measurements: 3.7 x 1.9 x 2.9 cm. Normal appearance/no adnexal mass. Left ovary Surgically absent. Pulsed Doppler evaluation of both ovaries demonstrates normal low-resistance arterial and venous waveforms. Other findings Trace free fluid, likely physiologic. IMPRESSION: 1. Normal appearance of the right ovary.  No evidence of torsion. 2. Prior left oophorectomy. 3. Endometrial thickness of 21 mm which is considered abnormal. Consider follow-up by [place] in 6-8 weeks, during the week immediately following menses (exam timing is critical). Electronically Signed   By: Sebastian Ache M.D.   On: 03/18/2017 16:07   Ct Renal Stone Study Result  Date: 03/18/2017 CLINICAL DATA:  Abdominal pain EXAM: CT  ABDOMEN AND PELVIS WITHOUT CONTRAST TECHNIQUE: Multidetector CT imaging of the abdomen and pelvis was performed following the standard protocol without IV contrast. COMPARISON:  None. FINDINGS: Lower chest: No acute abnormality. Hepatobiliary: No focal liver abnormality is seen. No gallstones, gallbladder wall thickening, or biliary dilatation. Pancreas: Unremarkable. No pancreatic ductal dilatation or surrounding inflammatory changes. Spleen: Normal in size without focal abnormality. Adrenals/Urinary Tract: Adrenal glands are unremarkable. Kidneys are normal, without renal calculi, focal lesion, or hydronephrosis. Bladder is unremarkable. Stomach/Bowel: Stomach is within normal limits. Appendix appears normal. No evidence of bowel wall thickening, distention, or inflammatory changes. Vascular/Lymphatic: No significant vascular findings are present. No enlarged abdominal or pelvic lymph nodes. Reproductive: Uterus and bilateral adnexa are unremarkable. Other: Small free fluid in the pelvis. Negative for free air. Small fat in the umbilicus. Small fat containing periumbilical ventral hernia. Musculoskeletal: No acute or significant osseous findings. IMPRESSION: 1. Negative for nephrolithiasis, hydronephrosis or ureteral stone 2. Small amount of free fluid in the pelvis 3. Small periumbilical fat containing ventral hernia Electronically Signed   By: Jasmine Pang M.D.   On: 03/18/2017 18:12    1900:  Workup reassuring. Pt will need f/u with her OB/GYN Dr. Despina Hidden for repeat US. Dx and testing d/w pt.  Questions answered.  Verb understanding, agreeable to d/c home with outpt f/u.    Final Clinical Impressions(s) / ED Diagnoses   Final diagnoses:  Pelvic pain in female  Pelvic pain in female    New Prescriptions New Prescriptions   No medications on file     Samuel Jester, DO 03/21/17 1548

## 2017-03-18 NOTE — ED Notes (Signed)
From CT 

## 2017-03-18 NOTE — ED Notes (Signed)
Pt reports history of 10 lb tumor from area of pain  States is low but denies urinary sx  Has been having discomfort for the last 3 weeks

## 2017-03-21 LAB — GC/CHLAMYDIA PROBE AMP (~~LOC~~) NOT AT ARMC
Chlamydia: NEGATIVE
NEISSERIA GONORRHEA: NEGATIVE

## 2017-08-25 DIAGNOSIS — J320 Chronic maxillary sinusitis: Secondary | ICD-10-CM | POA: Diagnosis not present

## 2017-09-12 ENCOUNTER — Encounter: Payer: Self-pay | Admitting: Adult Health

## 2017-09-12 ENCOUNTER — Ambulatory Visit: Payer: BLUE CROSS/BLUE SHIELD | Admitting: Adult Health

## 2017-09-12 VITALS — BP 128/80 | HR 90 | Ht 64.0 in | Wt 247.0 lb

## 2017-09-12 DIAGNOSIS — Z3201 Encounter for pregnancy test, result positive: Secondary | ICD-10-CM

## 2017-09-12 DIAGNOSIS — Z3A01 Less than 8 weeks gestation of pregnancy: Secondary | ICD-10-CM

## 2017-09-12 DIAGNOSIS — N926 Irregular menstruation, unspecified: Secondary | ICD-10-CM

## 2017-09-12 DIAGNOSIS — R110 Nausea: Secondary | ICD-10-CM

## 2017-09-12 DIAGNOSIS — O3680X0 Pregnancy with inconclusive fetal viability, not applicable or unspecified: Secondary | ICD-10-CM

## 2017-09-12 LAB — POCT URINE PREGNANCY: Preg Test, Ur: POSITIVE — AB

## 2017-09-12 NOTE — Progress Notes (Signed)
Subjective:     Patient ID: Kim Duran, female   DOB: December 10, 1985, 32 y.o.   MRN: [id number]  HPI Kim Duran is a 32 year old black female, married in for UPT, has missed a period and had 1+HPT.   Review of Systems +missed period with +HPT Some mild nausea Reviewed past medical,surgical, social and family history. Reviewed medications and allergies.     Objective:   Physical Exam BP 128/80 (BP Location: Left Arm, Patient Position: Sitting, Cuff Size: Large)   Pulse 90   Ht 5\' 4"  (1.626 m)   Wt 247 lb (112 kg)   LMP 07/31/2017 (Approximate)   Breastfeeding? Yes   BMI 42.40 kg/m    UPT +about 6+1 week by LMP with EDD 05/07/18.Skin warm and dry. Neck: mid line trachea, normal thyroid, good ROM, no lymphadenopathy noted. Lungs: clear to ausculation bilaterally. Cardiovascular: regular rate and rhythm.Abdomen is soft and non tender.  Assessment:     1. Pregnancy examination or test, positive result   2. Less than [redacted] weeks gestation of pregnancy   3. Encounter to determine fetal viability of pregnancy, single or unspecified fetus       Plan:     Return in 1 week for dating US Review handouts on First trimester and by [employer] often

## 2017-09-12 NOTE — Patient Instructions (Signed)
First Trimester of Pregnancy The first trimester of pregnancy is from week 1 until the end of week 13 (months 1 through 3). A week after a sperm fertilizes an egg, the egg will implant on the wall of the uterus. This embryo will begin to develop into a baby. Genes from you and your partner will form the baby. The female genes will determine whether the baby will be a boy or a girl. At 6-8 weeks, the eyes and face will be formed, and the heartbeat can be seen on ultrasound. At the end of 12 weeks, all the baby's organs will be formed. Now that you are pregnant, you will want to do everything you can to have a healthy baby. Two of the most important things are to get good prenatal care and to follow your health care provider's instructions. Prenatal care is all the medical care you receive before the baby's birth. This care will help prevent, find, and treat any problems during the pregnancy and childbirth. Body changes during your first trimester Your body goes through many changes during pregnancy. The changes vary from woman to woman.  You may gain or lose a couple of pounds at first.  You may feel sick to your stomach (nauseous) and you may throw up (vomit). If the vomiting is uncontrollable, call your health care provider.  You may tire easily.  You may develop headaches that can be relieved by medicines. All medicines should be approved by your health care provider.  You may urinate more often. Painful urination may mean you have a bladder infection.  You may develop heartburn as a result of your pregnancy.  You may develop constipation because certain hormones are causing the muscles that push stool through your intestines to slow down.  You may develop hemorrhoids or swollen veins (varicose veins).  Your breasts may begin to grow larger and become tender. Your nipples may stick out more, and the tissue that surrounds them (areola) may become darker.  Your gums may bleed and may be  sensitive to brushing and flossing.  Dark spots or blotches (chloasma, mask of pregnancy) may develop on your face. This will likely fade after the baby is born.  Your menstrual periods will stop.  You may have a loss of appetite.  You may develop cravings for certain kinds of food.  You may have changes in your emotions from day to day, such as being excited to be pregnant or being concerned that something may go wrong with the pregnancy and baby.  You may have more vivid and strange dreams.  You may have changes in your hair. These can include thickening of your hair, rapid growth, and changes in texture. Some women also have hair loss during or after pregnancy, or hair that feels dry or thin. Your hair will most likely return to normal after your baby is born.  What to expect at prenatal visits During a routine prenatal visit:  You will be weighed to make sure you and the baby are growing normally.  Your blood pressure will be taken.  Your abdomen will be measured to track your baby's growth.  The fetal heartbeat will be listened to between weeks 10 and 14 of your pregnancy.  Test results from any previous visits will be discussed.  Your health care provider may ask you:  How you are feeling.  If you are feeling the baby move.  If you have had any abnormal symptoms, such as leaking fluid, bleeding, severe headaches,   or abdominal cramping.  If you are using any tobacco products, including cigarettes, chewing tobacco, and electronic cigarettes.  If you have any questions.  Other tests that may be performed during your first trimester include:  Blood tests to find your blood type and to check for the presence of any previous infections. The tests will also be used to check for low iron levels (anemia) and protein on red blood cells (Rh antibodies). Depending on your risk factors, or if you previously had diabetes during pregnancy, you may have tests to check for high blood  sugar that affects pregnant women (gestational diabetes).  Urine tests to check for infections, diabetes, or protein in the urine.  An ultrasound to confirm the proper growth and development of the baby.  Fetal screens for spinal cord problems (spina bifida) and Down syndrome.  HIV (human immunodeficiency virus) testing. Routine prenatal testing includes screening for HIV, unless you choose not to have this test.  You may need other tests to make sure you and the baby are doing well.  Follow these instructions at home: Medicines  Follow your health care provider's instructions regarding medicine use. Specific medicines may be either safe or unsafe to take during pregnancy.  Take a prenatal vitamin that contains at least 600 micrograms (mcg) of folic acid.  If you develop constipation, try taking a stool softener if your health care provider approves. Eating and drinking  Eat a balanced diet that includes fresh fruits and vegetables, whole grains, good sources of protein such as meat, eggs, or tofu, and low-fat dairy. Your health care provider will help you determine the amount of weight gain that is right for you.  Avoid raw meat and uncooked cheese. These carry germs that can cause birth defects in the baby.  Eating four or five small meals rather than three large meals a day may help relieve nausea and vomiting. If you start to feel nauseous, eating a few soda crackers can be helpful. Drinking liquids between meals, instead of during meals, also seems to help ease nausea and vomiting.  Limit foods that are high in fat and processed sugars, such as fried and sweet foods.  To prevent constipation: ? Eat foods that are high in fiber, such as fresh fruits and vegetables, whole grains, and beans. ? Drink enough fluid to keep your urine clear or pale yellow. Activity  Exercise only as directed by your health care provider. Most women can continue their usual exercise routine during  pregnancy. Try to exercise for 30 minutes at least 5 days a week. Exercising will help you: ? Control your weight. ? Stay in shape. ? Be prepared for labor and delivery.  Experiencing pain or cramping in the lower abdomen or lower back is a good sign that you should stop exercising. Check with your health care provider before continuing with normal exercises.  Try to avoid standing for long periods of time. Move your legs often if you must stand in one place for a long time.  Avoid heavy lifting.  Wear low-heeled shoes and practice good posture.  You may continue to have sex unless your health care provider tells you not to. Relieving pain and discomfort  Wear a good support bra to relieve breast tenderness.  Take warm sitz baths to soothe any pain or discomfort caused by hemorrhoids. Use hemorrhoid cream if your health care provider approves.  Rest with your legs elevated if you have leg cramps or low back pain.  If you develop   varicose veins in your legs, wear support hose. Elevate your feet for 15 minutes, 3-4 times a day. Limit salt in your diet. Prenatal care  Schedule your prenatal visits by the twelfth week of pregnancy. They are usually scheduled monthly at first, then more often in the last 2 months before delivery.  Write down your questions. Take them to your prenatal visits.  Keep all your prenatal visits as told by your health care provider. This is important. Safety  Wear your seat belt at all times when driving.  Make a list of emergency phone numbers, including numbers for family, friends, the hospital, and police and fire departments. General instructions  Ask your health care provider for a referral to a local prenatal education class. Begin classes no later than the beginning of month 6 of your pregnancy.  Ask for help if you have counseling or nutritional needs during pregnancy. Your health care provider can offer advice or refer you to specialists for help  with various needs.  Do not use hot tubs, steam rooms, or saunas.  Do not douche or use tampons or scented sanitary pads.  Do not cross your legs for long periods of time.  Avoid cat litter boxes and soil used by cats. These carry germs that can cause birth defects in the baby and possibly loss of the fetus by miscarriage or stillbirth.  Avoid all smoking, herbs, alcohol, and medicines not prescribed by your health care provider. Chemicals in these products affect the formation and growth of the baby.  Do not use any products that contain nicotine or tobacco, such as cigarettes and e-cigarettes. If you need help quitting, ask your health care provider. You may receive counseling support and other resources to help you quit.  Schedule a dentist appointment. At home, brush your teeth with a soft toothbrush and be gentle when you floss. Contact a health care provider if:  You have dizziness.  You have mild pelvic cramps, pelvic pressure, or nagging pain in the abdominal area.  You have persistent nausea, vomiting, or diarrhea.  You have a bad smelling vaginal discharge.  You have pain when you urinate.  You notice increased swelling in your face, hands, legs, or ankles.  You are exposed to fifth disease or chickenpox.  You are exposed to German measles (rubella) and have never had it. Get help right away if:  You have a fever.  You are leaking fluid from your vagina.  You have spotting or bleeding from your vagina.  You have severe abdominal cramping or pain.  You have rapid weight gain or loss.  You vomit blood or material that looks like coffee grounds.  You develop a severe headache.  You have shortness of breath.  You have any kind of trauma, such as from a fall or a car accident. Summary  The first trimester of pregnancy is from week 1 until the end of week 13 (months 1 through 3).  Your body goes through many changes during pregnancy. The changes vary from  woman to woman.  You will have routine prenatal visits. During those visits, your health care provider will examine you, discuss any test results you may have, and talk with you about how you are feeling. This information is not intended to replace advice given to you by your health care provider. Make sure you discuss any questions you have with your health care provider. Document Released: 06/22/2001 Document Revised: 06/09/2016 Document Reviewed: 06/09/2016 Elsevier Interactive Patient Education  2018 Elsevier   Inc.  

## 2017-09-20 ENCOUNTER — Ambulatory Visit (INDEPENDENT_AMBULATORY_CARE_PROVIDER_SITE_OTHER): Payer: BLUE CROSS/BLUE SHIELD

## 2017-09-20 DIAGNOSIS — O3680X0 Pregnancy with inconclusive fetal viability, not applicable or unspecified: Secondary | ICD-10-CM | POA: Diagnosis not present

## 2017-09-20 DIAGNOSIS — Z3A01 Less than 8 weeks gestation of pregnancy: Secondary | ICD-10-CM | POA: Diagnosis not present

## 2017-09-20 NOTE — Progress Notes (Signed)
US 7+2 wks,single IUP w/ys,positive fht 143 bpm,normal right ovary,left oophorectomy,crl 10.19 mm,EDD 05/07/2018 by [facility]

## 2017-09-28 ENCOUNTER — Telehealth: Payer: Self-pay | Admitting: Women's Health

## 2017-09-28 NOTE — Telephone Encounter (Signed)
Patient called stating that she has been vomiting all day and she can't hold anything down. Pt would like to know if we could call her in something to her pharmacy. Please contact pt

## 2017-09-29 MED ORDER — DOXYLAMINE-PYRIDOXINE ER 20-20 MG PO TBCR: 1.0000 | EXTENDED_RELEASE_TABLET | Freq: Every day | ORAL | 8 refills | Status: DC

## 2017-10-03 ENCOUNTER — Telehealth: Payer: Self-pay | Admitting: *Deleted

## 2017-10-03 MED ORDER — PROMETHAZINE HCL 25 MG PO TABS: 12.5000 mg | ORAL_TABLET | Freq: Four times a day (QID) | ORAL | 0 refills | Status: DC | PRN

## 2017-10-03 NOTE — Telephone Encounter (Signed)
Pt called requesting a different medication for nausea be sent to her pharmacy. She states that it is taking too long for the Bonjesta to be filled. She states that she has tried sipping on water/ice, eating small frequent meals and sucking on hard candy but none of those seem to help. Informed pt that I would send her request to a provider and she could check with her pharmacy in the next 24hrs. Pt verbalized understanding.

## 2017-10-03 NOTE — Telephone Encounter (Signed)
Attempted to call pt to inform her that rx for phenergan had been sent to pharmacy. Unable to leave voicemail.

## 2017-10-10 ENCOUNTER — Ambulatory Visit: Payer: BLUE CROSS/BLUE SHIELD | Admitting: *Deleted

## 2017-10-10 ENCOUNTER — Encounter: Payer: BLUE CROSS/BLUE SHIELD | Admitting: Women's Health

## 2017-10-10 DIAGNOSIS — A084 Viral intestinal infection, unspecified: Secondary | ICD-10-CM | POA: Diagnosis not present

## 2017-10-10 DIAGNOSIS — R509 Fever, unspecified: Secondary | ICD-10-CM | POA: Diagnosis not present

## 2017-10-10 DIAGNOSIS — J069 Acute upper respiratory infection, unspecified: Secondary | ICD-10-CM | POA: Diagnosis not present

## 2017-10-10 DIAGNOSIS — R110 Nausea: Secondary | ICD-10-CM | POA: Diagnosis not present

## 2017-10-11 ENCOUNTER — Other Ambulatory Visit: Payer: Self-pay | Admitting: Obstetrics and Gynecology

## 2017-10-11 ENCOUNTER — Telehealth: Payer: Self-pay | Admitting: *Deleted

## 2017-10-11 ENCOUNTER — Ambulatory Visit (INDEPENDENT_AMBULATORY_CARE_PROVIDER_SITE_OTHER): Payer: BLUE CROSS/BLUE SHIELD | Admitting: Women's Health

## 2017-10-11 ENCOUNTER — Encounter (HOSPITAL_COMMUNITY)
Admission: RE | Admit: 2017-10-11 | Discharge: 2017-10-11 | Disposition: A | Discharge status: Home / Self Care | Payer: BLUE CROSS/BLUE SHIELD | Source: Ambulatory Visit | Attending: Obstetrics and Gynecology | Admitting: Obstetrics and Gynecology

## 2017-10-11 ENCOUNTER — Encounter (HOSPITAL_COMMUNITY): Payer: Self-pay

## 2017-10-11 ENCOUNTER — Encounter: Payer: Self-pay | Admitting: Women's Health

## 2017-10-11 ENCOUNTER — Telehealth: Payer: Self-pay | Admitting: Obstetrics and Gynecology

## 2017-10-11 VITALS — BP 126/74 | HR 82 | Temp 98.6°F | Ht 64.0 in | Wt 241.0 lb

## 2017-10-11 DIAGNOSIS — Z331 Pregnant state, incidental: Secondary | ICD-10-CM

## 2017-10-11 DIAGNOSIS — H9203 Otalgia, bilateral: Secondary | ICD-10-CM | POA: Diagnosis not present

## 2017-10-11 DIAGNOSIS — R509 Fever, unspecified: Secondary | ICD-10-CM | POA: Diagnosis not present

## 2017-10-11 DIAGNOSIS — O2301 Infections of kidney in pregnancy, first trimester: Secondary | ICD-10-CM

## 2017-10-11 DIAGNOSIS — Z1389 Encounter for screening for other disorder: Secondary | ICD-10-CM

## 2017-10-11 DIAGNOSIS — O2300 Infections of kidney in pregnancy, unspecified trimester: Secondary | ICD-10-CM | POA: Diagnosis not present

## 2017-10-11 DIAGNOSIS — R51 Headache: Secondary | ICD-10-CM

## 2017-10-11 LAB — POCT URINALYSIS DIPSTICK
Blood, UA: NEGATIVE
GLUCOSE UA: NEGATIVE
Nitrite, UA: NEGATIVE

## 2017-10-11 MED ORDER — DEXTROSE 5 % IV SOLN: 2.0000 g | Freq: Once | INTRAVENOUS | Status: DC

## 2017-10-11 MED ORDER — CEFTRIAXONE SODIUM 2 G IJ SOLR: 2.0000 g | Freq: Once | INTRAMUSCULAR | Status: DC

## 2017-10-11 MED ORDER — SODIUM CHLORIDE 0.9 % IV SOLN: INTRAVENOUS | Status: AC

## 2017-10-11 MED ORDER — SODIUM CHLORIDE 0.9 % IV SOLN
Freq: Once | INTRAVENOUS | Status: AC
Start: 2017-10-11 — End: 2017-10-11
  Administered 2017-10-11: 12:00:00 via INTRAVENOUS

## 2017-10-11 MED ORDER — SODIUM CHLORIDE 0.9 % IV SOLN
2.0000 g | Freq: Once | INTRAVENOUS | Status: AC
Start: 2017-10-11 — End: 2017-10-11
  Administered 2017-10-11: 2 g via INTRAVENOUS
  Filled 2017-10-11: qty 20

## 2017-10-11 NOTE — Progress Notes (Signed)
To be sent to [facility] for 2 gm Rocephin IV. Pt will be admitted if unimproved in 24 hrs.

## 2017-10-11 NOTE — Telephone Encounter (Signed)
Patient called with complaints of fever, back pain and just not feeling well.  States she went to several Urgent Care's but no one is able to figure out what is wrong with her.  She has been tested for the flu, strep throat, ear infection and all is negative.  Requesting her urine be checked for UTI.  Will w/i for visit with Selena BattenKim. Verbalized understanding.

## 2017-10-11 NOTE — Progress Notes (Signed)
   Work-in GYN VISIT Patient name: Kim Duran MRN [id number]  Date of birth: 06-04-1986 Chief Complaint:   Fever (side pain)  History of Present Illness:   Kim Duran is a 32 y.o. 635P3104 African American female at 6790w2d being seen today for report of fever/chills, Rt flank pain that began Saturday.  Also had some n/v on Sat, none since. Also reports headache, bilateral ear aches. No other cold sx. Denies UTI s/s. Went to 2 different urgent cares yesterday, flu swab neg x2, neg strep swab, didn't check urine per her report. Temp was 99 at urgent care, but had taken apap prior to going. Hasn't checked her temp at home. Took apap this am around 0400. Has not had new ob appt yet.   Patient's last menstrual period was 07/31/2017 (approximate). Review of Systems:   Pertinent items are noted in HPI Denies fever/chills, dizziness, headaches, visual disturbances, fatigue, shortness of breath, chest pain, abdominal pain, vomiting, abnormal vaginal discharge/itching/odor/irritation, problems with periods, bowel movements, urination, or intercourse unless otherwise stated above.  Pertinent History Reviewed:  Reviewed past medical,surgical, social, obstetrical and family history.  Reviewed problem list, medications and allergies. Physical Assessment:   Vitals:   10/11/17 0918  BP: 126/74  Pulse: 82  Temp: 98.6 F (37 C)  Weight: 241 lb (109.3 kg)  Height: 5\' 4"  (1.626 m)  Body mass index is 41.37 kg/m.       Physical Examination:   General appearance: alert, well appearing, and in mild distress, curled up on bed as I entered room  Mental status: alert, oriented to person, place, and time  Skin: warm & dry   Ears: filled w/ cerumen bilaterally, what I was able to see of both TMs appeared normal  Cardiovascular: normal heart rate noted  Respiratory: normal respiratory effort, no distress  Back: +Rt CVAT  Abdomen: soft, non-tender   Pelvic: examination not  indicated  Extremities: no edema    +FHT  Results for orders placed or performed in visit on 10/11/17 (from the past 24 hour(s))  POCT Urinalysis Dipstick   Collection Time: 10/11/17  9:20 AM  Result Value Ref Range   Color, UA     Clarity, UA     Glucose, UA neg    Bilirubin, UA     Ketones, UA large    Spec Grav, UA  1.010 - 1.025   Blood, UA neg    pH, UA  5.0 - 8.0   Protein, UA trace    Urobilinogen, UA  0.2 or 1.0 E.U./dL   Nitrite, UA neg    Leukocytes, UA Large (3+) (A) Negative   Appearance     Odor      Assessment & Plan:  1) 5390w2d pregnant  2) Presumed Rt pyelonephritis> discussed w/ JVF who also examined pt. He does not feel she needs inpatient management at this time, he will place orders for her to go to [facility] to receive Rocephin 2gm IV. Discussed warning s/s, reasons to seek care. Will f/u tomorrow to make sure she is starting to feel better  Meds: No orders of the defined types were placed in this encounter.   Orders Placed This Encounter  Procedures  . Urine Culture  . POCT Urinalysis Dipstick    Return in about 1 day (around 10/12/2017) for F/U.  Cheral MarkerKimberly R Larua Collier CNM, [facility] 10/11/2017 [phone]

## 2017-10-11 NOTE — Progress Notes (Signed)
IV orders for NS 1 liter with antibiotics placed.

## 2017-10-11 NOTE — Telephone Encounter (Signed)
Patient called [facility] and antibiotics. Pt states that they have the orders for the antibiotics but not the liquid for her dehydration. Please resend the order for the liquid IV for dehydration, contact pt when done.

## 2017-10-11 NOTE — Progress Notes (Signed)
See note of office visit by Genella RifeK Booker CNM

## 2017-10-12 ENCOUNTER — Encounter: Payer: Self-pay | Admitting: Women's Health

## 2017-10-12 ENCOUNTER — Ambulatory Visit (INDEPENDENT_AMBULATORY_CARE_PROVIDER_SITE_OTHER): Payer: BLUE CROSS/BLUE SHIELD | Admitting: Women's Health

## 2017-10-12 ENCOUNTER — Ambulatory Visit: Payer: BLUE CROSS/BLUE SHIELD | Admitting: Women's Health

## 2017-10-12 VITALS — BP 140/90 | HR 104 | Temp 98.3°F | Ht 64.0 in | Wt 241.5 lb

## 2017-10-12 DIAGNOSIS — R519 Headache, unspecified: Secondary | ICD-10-CM

## 2017-10-12 DIAGNOSIS — Z331 Pregnant state, incidental: Secondary | ICD-10-CM | POA: Diagnosis not present

## 2017-10-12 DIAGNOSIS — R51 Headache: Secondary | ICD-10-CM

## 2017-10-12 DIAGNOSIS — H9202 Otalgia, left ear: Secondary | ICD-10-CM

## 2017-10-12 DIAGNOSIS — Z1389 Encounter for screening for other disorder: Secondary | ICD-10-CM | POA: Diagnosis not present

## 2017-10-12 DIAGNOSIS — R110 Nausea: Secondary | ICD-10-CM | POA: Diagnosis not present

## 2017-10-12 DIAGNOSIS — O2301 Infections of kidney in pregnancy, first trimester: Secondary | ICD-10-CM | POA: Insufficient documentation

## 2017-10-12 DIAGNOSIS — O26899 Other specified pregnancy related conditions, unspecified trimester: Secondary | ICD-10-CM

## 2017-10-12 DIAGNOSIS — O26891 Other specified pregnancy related conditions, first trimester: Secondary | ICD-10-CM

## 2017-10-12 LAB — POCT URINALYSIS DIPSTICK
GLUCOSE UA: NEGATIVE
Nitrite, UA: NEGATIVE

## 2017-10-12 MED ORDER — PROMETHAZINE HCL 25 MG PO TABS: 12.5000 mg | ORAL_TABLET | Freq: Four times a day (QID) | ORAL | 0 refills | Status: DC | PRN

## 2017-10-12 MED ORDER — CEPHALEXIN 500 MG PO CAPS: 500.0000 mg | ORAL_CAPSULE | Freq: Four times a day (QID) | ORAL | 0 refills | Status: DC

## 2017-10-12 NOTE — Patient Instructions (Signed)
For Headaches:   Stay well hydrated, drink enough water so that your urine is clear, sometimes if you are dehydrated you can get headaches  Eat small frequent meals and snacks, sometimes if you are hungry you can get headaches  Sometimes you get headaches during pregnancy from the pregnancy hormones  You can try tylenol (1-2 regular strength 325mg  or 1-2 extra strength 500mg ) as directed on the box. The least amount of medication that works is best.   Cool compresses (cool wet washcloth or ice pack) to area of head that is hurting  You can also try drinking a caffeinated drink to see if this will help  If not helping, try below:  For Prevention of Headaches/Migraines:  CoQ10 100mg  three times daily  Vitamin B2 400mg  daily  Magnesium Oxide 400-600mg  daily  If You Get a Bad Headache/Migraine:  Benadryl 25mg    Magnesium Oxide  1 large Gatorade  2 extra strength Tylenol (1,000mg  total)  1 cup coffee or Coke  If this doesn't help please call us @ [phone]    Constipation  Drink plenty of fluid, preferably water, throughout the day  Eat foods high in fiber such as fruits, vegetables, and grains  Exercise, such as walking, is a good way to keep your bowels regular  Drink warm fluids, especially warm prune juice, or decaf coffee  Eat a 1/2 cup of real oatmeal (not instant), 1/2 cup applesauce, and 1/2-1 cup warm prune juice every day  If needed, you may take Colace (docusate sodium) stool softener once or twice a day to help keep the stool soft. If you are pregnant, wait until you are out of your first trimester (12-14 weeks of pregnancy)  If you still are having problems with constipation, you may take Miralax once daily as needed to help keep your bowels regular.  If you are pregnant, wait until you are out of your first trimester (12-14 weeks of pregnancy)    Nausea & Vomiting  Have saltine crackers or pretzels by your bed and eat a few bites before you raise  your head out of bed in the morning  Eat small frequent meals throughout the day instead of large meals  Drink plenty of fluids throughout the day to stay hydrated, just don't drink a lot of fluids with your meals.  This can make your stomach fill up faster making you feel sick  Do not brush your teeth right after you eat  Products with real ginger are good for nausea, like ginger ale and ginger hard candy Make sure it says made with real ginger!  Sucking on sour candy like lemon heads is also good for nausea  If your prenatal vitamins make you nauseated, take them at night so you will sleep through the nausea  Sea Bands  If you feel like you need medicine for the nausea & vomiting please let us know  If you are unable to keep any fluids or food down please let us know

## 2017-10-12 NOTE — Progress Notes (Signed)
GYN VISIT Patient name: Kim Duran MRN [id number]  Date of birth: 19-Oct-1985 Chief Complaint:   Follow-up (pt feels some better; headache on left side; nausea)  History of Present Illness:   Kim Duran is a 32 y.o. [id number] African American female at 6446w3d being seen today after receiving Rocephin 2gm IV yesterday at [facility] for suspected Rt pyelonephritis.  Pt states she is definitely feeling better, still not 100%, back pain much better. Took some tylenol at 0300 b/c felt hot, but not nearly like she had been feeling the days prior. Denies UTI s/s. Does still have Lt sided headache and Lt ear pain, feels she may have sinus infection. Tried flonase last night that didn't help, but got out a lot of nasal drainage/mucous.   Patient's last menstrual period was 07/31/2017 (approximate). Review of Systems:   Pertinent items are noted in HPI Denies fever/chills, dizziness, headaches, visual disturbances, fatigue, shortness of breath, chest pain, abdominal pain, vomiting, abnormal vaginal discharge/itching/odor/irritation, problems with periods, bowel movements, urination, or intercourse unless otherwise stated above.  Pertinent History Reviewed:  Reviewed past medical,surgical, social, obstetrical and family history.  Reviewed problem list, medications and allergies. Physical Assessment:   Vitals:   10/12/17 0916  BP: 140/90  Pulse: (!) 104  Temp: 98.3 F (36.8 C)  Weight: 241 lb 8 oz (109.5 kg)  Height: 5\' 4"  (1.626 m)  Body mass index is 41.45 kg/m.       Physical Examination:   General appearance: alert, well appearing, and in no distress  Mental status: alert, oriented to person, place, and time  Skin: warm & dry   Sinuses: no tenderness to palpation  Back: NO CVAT today!  Cardiovascular: normal heart rate noted  Respiratory: normal respiratory effort, no distress  Abdomen: soft, non-tender   Pelvic: examination not indicated  Extremities: no edema    Results for orders placed or performed in visit on 10/12/17 (from the past 24 hour(s))  POCT urinalysis dipstick   Collection Time: 10/12/17  9:22 AM  Result Value Ref Range   Color, UA     Clarity, UA     Glucose, UA neg    Bilirubin, UA     Ketones, UA trace    Spec Grav, UA  1.010 - 1.025   Blood, UA 2+    pH, UA  5.0 - 8.0   Protein, UA 1+    Urobilinogen, UA  0.2 or 1.0 E.U./dL   Nitrite, UA neg    Leukocytes, UA Trace (A) Negative   Appearance     Odor      Assessment & Plan:  1) 4846w3d pregnant  2) Presumed Rt pyelonephritis> urine cx still pending. S/P Rocephin 2gm IV x 1 yesterday. Afebrile today, feeling better, no CVAT today. Will rx keflex QID x 7d for pyelo/URI sx. Discussed warning s/s, reasons to seek care.   3) URI/Rt earache  4) Headache> gave printed prevention/relief measures   5) Nausea> gave printed prevention/relief measures, rx phenergan  Meds:  Meds ordered this encounter  Medications  . promethazine (PHENERGAN) 25 MG tablet    Sig: Take 0.5-1 tablets (12.5-25 mg total) by mouth every 6 (six) hours as needed for nausea or vomiting.    Dispense:  30 tablet    Refill:  0    Order Specific Question:   Supervising Provider    Answer:   Despina HiddenEURE, LUTHER H [[id number]]  . cephALEXin (KEFLEX) 500 MG capsule    Sig: Take 1 capsule (500  mg total) by mouth 4 (four) times daily. X 7 days    Dispense:  28 capsule    Refill:  0    Order Specific Question:   Supervising Provider    Answer:   Duane Lope H [[id number]]    Orders Placed This Encounter  Procedures  . POCT urinalysis dipstick    Return for As scheduled 4/18 for new ob.  Cheral Marker CNM, [facility] 10/12/2017 [phone]

## 2017-10-13 LAB — URINE CULTURE

## 2017-10-27 ENCOUNTER — Encounter: Payer: Self-pay | Admitting: *Deleted

## 2017-10-27 ENCOUNTER — Ambulatory Visit: Payer: BLUE CROSS/BLUE SHIELD | Admitting: *Deleted

## 2017-10-27 ENCOUNTER — Ambulatory Visit (INDEPENDENT_AMBULATORY_CARE_PROVIDER_SITE_OTHER): Payer: BLUE CROSS/BLUE SHIELD | Admitting: Advanced Practice Midwife

## 2017-10-27 ENCOUNTER — Encounter: Payer: Self-pay | Admitting: Advanced Practice Midwife

## 2017-10-27 DIAGNOSIS — O09211 Supervision of pregnancy with history of pre-term labor, first trimester: Secondary | ICD-10-CM | POA: Diagnosis not present

## 2017-10-27 DIAGNOSIS — Z331 Pregnant state, incidental: Secondary | ICD-10-CM

## 2017-10-27 DIAGNOSIS — O2301 Infections of kidney in pregnancy, first trimester: Secondary | ICD-10-CM

## 2017-10-27 DIAGNOSIS — Z3481 Encounter for supervision of other normal pregnancy, first trimester: Secondary | ICD-10-CM | POA: Diagnosis not present

## 2017-10-27 DIAGNOSIS — O34219 Maternal care for unspecified type scar from previous cesarean delivery: Secondary | ICD-10-CM

## 2017-10-27 DIAGNOSIS — Z3A12 12 weeks gestation of pregnancy: Secondary | ICD-10-CM

## 2017-10-27 DIAGNOSIS — N12 Tubulo-interstitial nephritis, not specified as acute or chronic: Secondary | ICD-10-CM

## 2017-10-27 DIAGNOSIS — Z1389 Encounter for screening for other disorder: Secondary | ICD-10-CM

## 2017-10-27 DIAGNOSIS — Z3682 Encounter for antenatal screening for nuchal translucency: Secondary | ICD-10-CM

## 2017-10-27 DIAGNOSIS — Z3490 Encounter for supervision of normal pregnancy, unspecified, unspecified trimester: Secondary | ICD-10-CM | POA: Insufficient documentation

## 2017-10-27 LAB — POCT URINALYSIS DIPSTICK
Blood, UA: NEGATIVE
Glucose, UA: NEGATIVE
Ketones, UA: NEGATIVE
Leukocytes, UA: NEGATIVE
Nitrite, UA: NEGATIVE
Protein, UA: NEGATIVE

## 2017-10-27 MED ORDER — CEPHALEXIN 500 MG PO CAPS: 500.0000 mg | ORAL_CAPSULE | Freq: Every day | ORAL | 9 refills | Status: DC

## 2017-10-27 MED ORDER — CEPHALEXIN 500 MG PO CAPS: 500.0000 mg | ORAL_CAPSULE | Freq: Every day | ORAL | 8 refills | Status: DC

## 2017-10-27 NOTE — Patient Instructions (Signed)
 First Trimester of Pregnancy The first trimester of pregnancy is from week 1 until the end of week 12 (months 1 through 3). A week after a sperm fertilizes an egg, the egg will implant on the wall of the uterus. This embryo will begin to develop into a baby. Genes from you and your partner are forming the baby. The female genes determine whether the baby is a boy or a girl. At 6-8 weeks, the eyes and face are formed, and the heartbeat can be seen on ultrasound. At the end of 12 weeks, all the baby's organs are formed.  Now that you are pregnant, you will want to do everything you can to have a healthy baby. Two of the most important things are to get good prenatal care and to follow your health care provider's instructions. Prenatal care is all the medical care you receive before the baby's birth. This care will help prevent, find, and treat any problems during the pregnancy and childbirth. BODY CHANGES Your body goes through many changes during pregnancy. The changes vary from woman to woman.   You may gain or lose a couple of pounds at first.  You may feel sick to your stomach (nauseous) and throw up (vomit). If the vomiting is uncontrollable, call your health care provider.  You may tire easily.  You may develop headaches that can be relieved by medicines approved by your health care provider.  You may urinate more often. Painful urination may mean you have a bladder infection.  You may develop heartburn as a result of your pregnancy.  You may develop constipation because certain hormones are causing the muscles that push waste through your intestines to slow down.  You may develop hemorrhoids or swollen, bulging veins (varicose veins).  Your breasts may begin to grow larger and become tender. Your nipples may stick out more, and the tissue that surrounds them (areola) may become darker.  Your gums may bleed and may be sensitive to brushing and flossing.  Dark spots or blotches  (chloasma, mask of pregnancy) may develop on your face. This will likely fade after the baby is born.  Your menstrual periods will stop.  You may have a loss of appetite.  You may develop cravings for certain kinds of food.  You may have changes in your emotions from day to day, such as being excited to be pregnant or being concerned that something may go wrong with the pregnancy and baby.  You may have more vivid and strange dreams.  You may have changes in your hair. These can include thickening of your hair, rapid growth, and changes in texture. Some women also have hair loss during or after pregnancy, or hair that feels dry or thin. Your hair will most likely return to normal after your baby is born. WHAT TO EXPECT AT YOUR PRENATAL VISITS During a routine prenatal visit:  You will be weighed to make sure you and the baby are growing normally.  Your blood pressure will be taken.  Your abdomen will be measured to track your baby's growth.  The fetal heartbeat will be listened to starting around week 10 or 12 of your pregnancy.  Test results from any previous visits will be discussed. Your health care provider may ask you:  How you are feeling.  If you are feeling the baby move.  If you have had any abnormal symptoms, such as leaking fluid, bleeding, severe headaches, or abdominal cramping.  If you have any questions. Other   tests that may be performed during your first trimester include:  Blood tests to find your blood type and to check for the presence of any previous infections. They will also be used to check for low iron levels (anemia) and Rh antibodies. Later in the pregnancy, blood tests for diabetes will be done along with other tests if problems develop.  Urine tests to check for infections, diabetes, or protein in the urine.  An ultrasound to confirm the proper growth and development of the baby.  An amniocentesis to check for possible genetic problems.  Fetal  screens for spina bifida and Down syndrome.  You may need other tests to make sure you and the baby are doing well. HOME CARE INSTRUCTIONS  Medicines  Follow your health care provider's instructions regarding medicine use. Specific medicines may be either safe or unsafe to take during pregnancy.  Take your prenatal vitamins as directed.  If you develop constipation, try taking a stool softener if your health care provider approves. Diet  Eat regular, well-balanced meals. Choose a variety of foods, such as meat or vegetable-based protein, fish, milk and low-fat dairy products, vegetables, fruits, and whole grain breads and cereals. Your health care provider will help you determine the amount of weight gain that is right for you.  Avoid raw meat and uncooked cheese. These carry germs that can cause birth defects in the baby.  Eating four or five small meals rather than three large meals a day may help relieve nausea and vomiting. If you start to feel nauseous, eating a few soda crackers can be helpful. Drinking liquids between meals instead of during meals also seems to help nausea and vomiting.  If you develop constipation, eat more high-fiber foods, such as fresh vegetables or fruit and whole grains. Drink enough fluids to keep your urine clear or pale yellow. Activity and Exercise  Exercise only as directed by your health care provider. Exercising will help you:  Control your weight.  Stay in shape.  Be prepared for labor and delivery.  Experiencing pain or cramping in the lower abdomen or low back is a good sign that you should stop exercising. Check with your health care provider before continuing normal exercises.  Try to avoid standing for long periods of time. Move your legs often if you must stand in one place for a long time.  Avoid heavy lifting.  Wear low-heeled shoes, and practice good posture.  You may continue to have sex unless your health care provider directs you  otherwise. Relief of Pain or Discomfort  Wear a good support bra for breast tenderness.   Take warm sitz baths to soothe any pain or discomfort caused by hemorrhoids. Use hemorrhoid cream if your health care provider approves.   Rest with your legs elevated if you have leg cramps or low back pain.  If you develop varicose veins in your legs, wear support hose. Elevate your feet for 15 minutes, 3-4 times a day. Limit salt in your diet. Prenatal Care  Schedule your prenatal visits by the twelfth week of pregnancy. They are usually scheduled monthly at first, then more often in the last 2 months before delivery.  Write down your questions. Take them to your prenatal visits.  Keep all your prenatal visits as directed by your health care provider. Safety  Wear your seat belt at all times when driving.  Make a list of emergency phone numbers, including numbers for family, friends, the hospital, and police and fire departments. General   Tips  Ask your health care provider for a referral to a local prenatal education class. Begin classes no later than at the beginning of month 6 of your pregnancy.  Ask for help if you have counseling or nutritional needs during pregnancy. Your health care provider can offer advice or refer you to specialists for help with various needs.  Do not use hot tubs, steam rooms, or saunas.  Do not douche or use tampons or scented sanitary pads.  Do not cross your legs for long periods of time.  Avoid cat litter boxes and soil used by cats. These carry germs that can cause birth defects in the baby and possibly loss of the fetus by miscarriage or stillbirth.  Avoid all smoking, herbs, alcohol, and medicines not prescribed by your health care provider. Chemicals in these affect the formation and growth of the baby.  Schedule a dentist appointment. At home, brush your teeth with a soft toothbrush and be gentle when you floss. SEEK MEDICAL CARE IF:   You have  dizziness.  You have mild pelvic cramps, pelvic pressure, or nagging pain in the abdominal area.  You have persistent nausea, vomiting, or diarrhea.  You have a bad smelling vaginal discharge.  You have pain with urination.  You notice increased swelling in your face, hands, legs, or ankles. SEEK IMMEDIATE MEDICAL CARE IF:   You have a fever.  You are leaking fluid from your vagina.  You have spotting or bleeding from your vagina.  You have severe abdominal cramping or pain.  You have rapid weight gain or loss.  You vomit blood or material that looks like coffee grounds.  You are exposed to German measles and have never had them.  You are exposed to fifth disease or chickenpox.  You develop a severe headache.  You have shortness of breath.  You have any kind of trauma, such as from a fall or a car accident. Document Released: 06/22/2001 Document Revised: 11/12/2013 Document Reviewed: 05/08/2013 Exit[employer] Patient Information 2015 [employer]. This information is not intended to replace advice given to you by your health care provider. Make sure you discuss any questions you have with your health care provider.   Nausea & Vomiting  Have saltine crackers or pretzels by your bed and eat a few bites before you raise your head out of bed in the morning  Eat small frequent meals throughout the day instead of large meals  Drink plenty of fluids throughout the day to stay hydrated, just don't drink a lot of fluids with your meals.  This can make your stomach fill up faster making you feel sick  Do not brush your teeth right after you eat  Products with real ginger are good for nausea, like ginger ale and ginger hard candy Make sure it says made with real ginger!  Sucking on sour candy like lemon heads is also good for nausea  If your prenatal vitamins make you nauseated, take them at night so you will sleep through the nausea  Sea Bands  If you feel like you need  medicine for the nausea & vomiting please let us know  If you are unable to keep any fluids or food down please let us know   Constipation  Drink plenty of fluid, preferably water, throughout the day  Eat foods high in fiber such as fruits, vegetables, and grains  Exercise, such as walking, is a good way to keep your bowels regular  Drink warm fluids, especially warm   prune juice, or decaf coffee  Eat a 1/2 cup of real oatmeal (not instant), 1/2 cup applesauce, and 1/2-1 cup warm prune juice every day  If needed, you may take Colace (docusate sodium) stool softener once or twice a day to help keep the stool soft. If you are pregnant, wait until you are out of your first trimester (12-14 weeks of pregnancy)  If you still are having problems with constipation, you may take Miralax once daily as needed to help keep your bowels regular.  If you are pregnant, wait until you are out of your first trimester (12-14 weeks of pregnancy)  Safe Medications in Pregnancy   Acne: Benzoyl Peroxide Salicylic Acid  Backache/Headache: Tylenol: 2 regular strength every 4 hours OR              2 Extra strength every 6 hours  Colds/Coughs/Allergies: Benadryl (alcohol free) 25 mg every 6 hours as needed Breath right strips Claritin Cepacol throat lozenges Chloraseptic throat spray Cold-Eeze- up to three times per day Cough drops, alcohol free Flonase (by prescription only) Guaifenesin Mucinex Robitussin DM (plain only, alcohol free) Saline nasal spray/drops Sudafed (pseudoephedrine) & Actifed ** use only after [redacted] weeks gestation and if you do not have high blood pressure Tylenol Vicks Vaporub Zinc lozenges Zyrtec   Constipation: Colace Ducolax suppositories Fleet enema Glycerin suppositories Metamucil Milk of magnesia Miralax Senokot Smooth move tea  Diarrhea: Kaopectate Imodium A-D  *NO pepto Bismol  Hemorrhoids: Anusol Anusol HC Preparation  H Tucks  Indigestion: Tums Maalox Mylanta Zantac  Pepcid  Insomnia: Benadryl (alcohol free) 25mg every 6 hours as needed Tylenol PM Unisom, no Gelcaps  Leg Cramps: Tums MagGel  Nausea/Vomiting:  Bonine Dramamine Emetrol Ginger extract Sea bands Meclizine  Nausea medication to take during pregnancy:  Unisom (doxylamine succinate 25 mg tablets) Take one tablet daily at bedtime. If symptoms are not adequately controlled, the dose can be increased to a maximum recommended dose of two tablets daily (1/2 tablet in the morning, 1/2 tablet mid-afternoon and one at bedtime). Vitamin B6 100mg tablets. Take one tablet twice a day (up to 200 mg per day).  Skin Rashes: Aveeno products Benadryl cream or 25mg every 6 hours as needed Calamine Lotion 1% cortisone cream  Yeast infection: Gyne-lotrimin 7 Monistat 7   **If taking multiple medications, please check labels to avoid duplicating the same active ingredients **take medication as directed on the label ** Do not exceed 4000 mg of tylenol in 24 hours **Do not take medications that contain aspirin or ibuprofen      

## 2017-10-27 NOTE — Progress Notes (Signed)
Subjective:    Kim Duran is a [id number] [redacted]w[redacted]d being seen today for her first obstetrical visit.  Her obstetrical history is significant for 33 week CS for abruption and 2 successful VBACs. (error:  3 VBACS.  Wants another VBAC) Pregnancy history fully reviewed. She had pyelo earlier this month, feels much better.  Patient reports no complaints. 130/82  HISTORY: OB History  Gravida Para Term Preterm AB Living  5 4 3 1   4   SAB TAB Ectopic Multiple Live Births        0 4    # Outcome Date GA Lbr Len/2nd Weight Sex Delivery Anes PTL Lv  5 Current           4 Term 07/30/14 [redacted]w[redacted]d 64:02 / 00:19 3.795 kg (8 lb 5.9 oz) M VBAC EPI N LIV  3 Term 10/17/12 [redacted]w[redacted]d 24:30 / 00:27 3.615 kg (7 lb 15.5 oz) M VBAC EPI N LIV     Birth Comments: No anomalies noted  2 Term 01/09/11 [redacted]w[redacted]d 10:00 3.459 kg (7 lb 10 oz) F VBAC EPI N LIV  1 Preterm 10/17/07 [redacted]w[redacted]d  1.814 kg (4 lb) F CS-LTranv Gen Y LIV     Complications: Placental abruption   Past Medical History:  Diagnosis Date  . Medical history non-contributory    Past Surgical History:  Procedure Laterality Date  . CESAREAN SECTION    . left ovary and tube removed  2011   Family History  Problem Relation Age of Onset  . Hypertension Mother   . Hypertension Father   . Cancer Paternal Aunt        breast cancer  . Cancer Paternal Aunt        breast  . Cancer Maternal Grandmother        liver  . Cancer Paternal Grandfather   . Cancer Maternal Grandfather        prostate  . Premature birth Daughter        born at 3 weeks  . Diabetes Paternal Aunt   . Cancer Other        breast and ovarian ca. relation unspecified     Exam                                      System:     Skin: normal coloration and turgor, no rashes    Neurologic: oriented, normal, normal mood   Extremities: normal strength, tone, and muscle mass   HEENT PERRLA   Mouth/Teeth mucous membranes moist, normal dentition   Neck supple and no masses   Cardiovascular: regular rate and rhythm   Respiratory:  appears well, vitals normal, no respiratory distress, acyanotic   Abdomen: soft, non-tender;  FHR: 160 [place]        The nature of [name] with multiple MDs and other Advanced Practice Providers was explained to patient; also emphasized that residents, students are part of our team.  Assessment:    Pregnancy: [id number] Patient Active Problem List   Diagnosis Date Noted  . Supervision of normal pregnancy 10/27/2017  . Pyelonephritis affecting pregnancy in first trimester 10/12/2017  . History of preterm delivery, currently pregnant 04/10/2014  . Previous cesarean section complicating pregnancy, antepartum condition or complication 09/27/2012        Plan:     Initial labs drawn. Continue prenatal vitamins  Keflex qhs for pyelo prophylaxis Problem  list reviewed and updated  Reviewed recommended weight gain based on pre-gravid BMI  Encouraged well-balanced diet Genetic Screening discussed Integrated Screen: requested.  Ultrasound discussed; fetal survey: requested.  Return for asap for NT/IT only; 5weeks LROB.  [name] 10/28/2017

## 2017-10-28 LAB — PMP SCREEN PROFILE (10S), URINE
Amphetamine Scrn, Ur: NEGATIVE ng/mL
BARBITURATE SCREEN URINE: NEGATIVE ng/mL
BENZODIAZEPINE SCREEN, URINE: NEGATIVE ng/mL
CANNABINOIDS UR QL SCN: NEGATIVE ng/mL
CREATININE(CRT), U: 168.5 mg/dL (ref 20.0–300.0)
Cocaine (Metab) Scrn, Ur: NEGATIVE ng/mL
Methadone Screen, Urine: NEGATIVE ng/mL
OPIATE SCREEN URINE: NEGATIVE ng/mL
OXYCODONE+OXYMORPHONE UR QL SCN: NEGATIVE ng/mL
PH UR, DRUG SCRN: 6.9 (ref 4.5–8.9)
Phencyclidine Qn, Ur: NEGATIVE ng/mL
Propoxyphene Scrn, Ur: NEGATIVE ng/mL

## 2017-10-29 LAB — URINE CULTURE

## 2017-10-29 LAB — GC/CHLAMYDIA PROBE AMP
Chlamydia trachomatis, NAA: NEGATIVE
Neisseria gonorrhoeae by PCR: NEGATIVE

## 2017-10-29 LAB — URINALYSIS, ROUTINE W REFLEX MICROSCOPIC
BILIRUBIN UA: NEGATIVE
GLUCOSE, UA: NEGATIVE
Ketones, UA: NEGATIVE
LEUKOCYTES UA: NEGATIVE
Nitrite, UA: NEGATIVE
PROTEIN UA: NEGATIVE
RBC UA: NEGATIVE
Specific Gravity, UA: 1.022 (ref 1.005–1.030)
UUROB: 0.2 mg/dL (ref 0.2–1.0)
pH, UA: 7 (ref 5.0–7.5)

## 2017-10-29 LAB — OBSTETRIC PANEL, INCLUDING HIV
Antibody Screen: NEGATIVE
BASOS ABS: 0 10*3/uL (ref 0.0–0.2)
Basos: 0 %
EOS (ABSOLUTE): 0.1 10*3/uL (ref 0.0–0.4)
Eos: 2 %
HEP B S AG: NEGATIVE
HIV SCREEN 4TH GENERATION: NONREACTIVE
Hematocrit: 31.9 % — ABNORMAL LOW (ref 34.0–46.6)
Hemoglobin: 10.2 g/dL — ABNORMAL LOW (ref 11.1–15.9)
Immature Grans (Abs): 0 10*3/uL (ref 0.0–0.1)
Immature Granulocytes: 0 %
Lymphocytes Absolute: 1.7 10*3/uL (ref 0.7–3.1)
Lymphs: 17 %
MCH: 22.6 pg — ABNORMAL LOW (ref 26.6–33.0)
MCHC: 32 g/dL (ref 31.5–35.7)
MCV: 71 fL — AB (ref 79–97)
Monocytes Absolute: 0.6 10*3/uL (ref 0.1–0.9)
Monocytes: 6 %
NEUTROS ABS: 7.1 10*3/uL — AB (ref 1.4–7.0)
NEUTROS PCT: 75 %
PLATELETS: 445 10*3/uL — AB (ref 150–379)
RBC: 4.52 x10E6/uL (ref 3.77–5.28)
RDW: 22.8 % — ABNORMAL HIGH (ref 12.3–15.4)
RPR: NONREACTIVE
RUBELLA: 8.35 {index} (ref >0.99)
Rh Factor: POSITIVE
WBC: 9.5 10*3/uL (ref 3.4–10.8)

## 2017-10-29 LAB — SICKLE CELL SCREEN: SICKLE CELL SCREEN: NEGATIVE

## 2017-11-01 ENCOUNTER — Ambulatory Visit (INDEPENDENT_AMBULATORY_CARE_PROVIDER_SITE_OTHER): Payer: BLUE CROSS/BLUE SHIELD

## 2017-11-01 ENCOUNTER — Other Ambulatory Visit: Payer: BLUE CROSS/BLUE SHIELD

## 2017-11-01 DIAGNOSIS — Z3A13 13 weeks gestation of pregnancy: Secondary | ICD-10-CM | POA: Diagnosis not present

## 2017-11-01 DIAGNOSIS — O3481 Maternal care for other abnormalities of pelvic organs, first trimester: Secondary | ICD-10-CM

## 2017-11-01 DIAGNOSIS — N8311 Corpus luteum cyst of right ovary: Secondary | ICD-10-CM | POA: Diagnosis not present

## 2017-11-01 DIAGNOSIS — Z3402 Encounter for supervision of normal first pregnancy, second trimester: Secondary | ICD-10-CM

## 2017-11-01 DIAGNOSIS — Z3682 Encounter for antenatal screening for nuchal translucency: Secondary | ICD-10-CM

## 2017-11-01 NOTE — Progress Notes (Signed)
US 13+2 wks,measurements c/w dates,crl 72.08 mm,fhr 155 bpm,left lateral pl gr 0,NB present,NT 1.5 mm,complex right corpus luteal cyst 2.4 x 1.7 x 2 cm,left oophorectomy

## 2017-11-03 LAB — INTEGRATED 1
CROWN RUMP LENGTH MAT SCREEN: 72.1 mm
Gest. Age on Collection Date: 13.1 weeks
Maternal Age at EDD: 31.8 yr
Nuchal Translucency (NT): 1.5 mm
Number of Fetuses: 1
PAPP-A Value: 947.8 ng/mL
Weight: 242 [lb_av]

## 2017-11-16 ENCOUNTER — Telehealth: Payer: Self-pay | Admitting: *Deleted

## 2017-11-16 NOTE — Telephone Encounter (Signed)
Insurance called for PA for [employer]. [phone].  Will get notification by 11/19/17

## 2017-11-23 ENCOUNTER — Telehealth: Payer: Self-pay | Admitting: *Deleted

## 2017-11-23 NOTE — Telephone Encounter (Signed)
[place] denied. Pt notified.  States she is feeling better and doesn't need anything at this time. Advised to let us know if that changes.

## 2017-12-01 ENCOUNTER — Ambulatory Visit (INDEPENDENT_AMBULATORY_CARE_PROVIDER_SITE_OTHER): Payer: BLUE CROSS/BLUE SHIELD | Admitting: Advanced Practice Midwife

## 2017-12-01 ENCOUNTER — Encounter: Payer: Self-pay | Admitting: Advanced Practice Midwife

## 2017-12-01 VITALS — BP 130/88 | HR 84 | Wt 243.0 lb

## 2017-12-01 DIAGNOSIS — Z331 Pregnant state, incidental: Secondary | ICD-10-CM

## 2017-12-01 DIAGNOSIS — Z1389 Encounter for screening for other disorder: Secondary | ICD-10-CM

## 2017-12-01 DIAGNOSIS — Z3482 Encounter for supervision of other normal pregnancy, second trimester: Secondary | ICD-10-CM

## 2017-12-01 DIAGNOSIS — Z363 Encounter for antenatal screening for malformations: Secondary | ICD-10-CM

## 2017-12-01 DIAGNOSIS — Z3A17 17 weeks gestation of pregnancy: Secondary | ICD-10-CM

## 2017-12-01 LAB — POCT URINALYSIS DIPSTICK
GLUCOSE UA: NEGATIVE
KETONES UA: NEGATIVE
Leukocytes, UA: NEGATIVE
Nitrite, UA: NEGATIVE
Protein, UA: NEGATIVE
RBC UA: NEGATIVE

## 2017-12-01 NOTE — Patient Instructions (Signed)
Kim Duran, I greatly value your feedback.  If you receive a survey following your visit with [place] today, we appreciate you taking the time to fill it out.  Thanks, Cathie Beams, CNM     Second Trimester of Pregnancy The second trimester is from week 14 through week 27 (months 4 through 6). The second trimester is often a time when you feel your best. Your body has adjusted to being pregnant, and you begin to feel better physically. Usually, morning sickness has lessened or quit completely, you may have more energy, and you may have an increase in appetite. The second trimester is also a time when the fetus is growing rapidly. At the end of the sixth month, the fetus is about 9 inches long and weighs about 1 pounds. You will likely begin to feel the baby move (quickening) between 16 and 20 weeks of pregnancy. Body changes during your second trimester Your body continues to go through many changes during your second trimester. The changes vary from woman to woman.  Your weight will continue to increase. You will notice your lower abdomen bulging out.  You may begin to get stretch marks on your hips, abdomen, and breasts.  You may develop headaches that can be relieved by medicines. The medicines should be approved by your health care provider.  You may urinate more often because the fetus is pressing on your bladder.  You may develop or continue to have heartburn as a result of your pregnancy.  You may develop constipation because certain hormones are causing the muscles that push waste through your intestines to slow down.  You may develop hemorrhoids or swollen, bulging veins (varicose veins).  You may have back pain. This is caused by: ? Weight gain. ? Pregnancy hormones that are relaxing the joints in your pelvis. ? A shift in weight and the muscles that support your balance.  Your breasts will continue to grow and they will continue to become tender.  Your gums  may bleed and may be sensitive to brushing and flossing.  Dark spots or blotches (chloasma, mask of pregnancy) may develop on your face. This will likely fade after the baby is born.  A dark line from your belly button to the pubic area (linea nigra) may appear. This will likely fade after the baby is born.  You may have changes in your hair. These can include thickening of your hair, rapid growth, and changes in texture. Some women also have hair loss during or after pregnancy, or hair that feels dry or thin. Your hair will most likely return to normal after your baby is born.  What to expect at prenatal visits During a routine prenatal visit:  You will be weighed to make sure you and the fetus are growing normally.  Your blood pressure will be taken.  Your abdomen will be measured to track your baby's growth.  The fetal heartbeat will be listened to.  Any test results from the previous visit will be discussed.  Your health care provider may ask you:  How you are feeling.  If you are feeling the baby move.  If you have had any abnormal symptoms, such as leaking fluid, bleeding, severe headaches, or abdominal cramping.  If you are using any tobacco products, including cigarettes, chewing tobacco, and electronic cigarettes.  If you have any questions.  Other tests that may be performed during your second trimester include:  Blood tests that check for: ? Low iron levels (anemia). ? High  blood sugar that affects pregnant women (gestational diabetes) between 42 and 28 weeks. ? Rh antibodies. This is to check for a protein on red blood cells (Rh factor).  Urine tests to check for infections, diabetes, or protein in the urine.  An ultrasound to confirm the proper growth and development of the baby.  An amniocentesis to check for possible genetic problems.  Fetal screens for spina bifida and Down syndrome.  HIV (human immunodeficiency virus) testing. Routine prenatal testing  includes screening for HIV, unless you choose not to have this test.  Follow these instructions at home: Medicines  Follow your health care provider's instructions regarding medicine use. Specific medicines may be either safe or unsafe to take during pregnancy.  Take a prenatal vitamin that contains at least 600 micrograms (mcg) of folic acid.  If you develop constipation, try taking a stool softener if your health care provider approves. Eating and drinking  Eat a balanced diet that includes fresh fruits and vegetables, whole grains, good sources of protein such as meat, eggs, or tofu, and low-fat dairy. Your health care provider will help you determine the amount of weight gain that is right for you.  Avoid raw meat and uncooked cheese. These carry germs that can cause birth defects in the baby.  If you have low calcium intake from food, talk to your health care provider about whether you should take a daily calcium supplement.  Limit foods that are high in fat and processed sugars, such as fried and sweet foods.  To prevent constipation: ? Drink enough fluid to keep your urine clear or pale yellow. ? Eat foods that are high in fiber, such as fresh fruits and vegetables, whole grains, and beans. Activity  Exercise only as directed by your health care provider. Most women can continue their usual exercise routine during pregnancy. Try to exercise for 30 minutes at least 5 days a week. Stop exercising if you experience uterine contractions.  Avoid heavy lifting, wear low heel shoes, and practice good posture.  A sexual relationship may be continued unless your health care provider directs you otherwise. Relieving pain and discomfort  Wear a good support bra to prevent discomfort from breast tenderness.  Take warm sitz baths to soothe any pain or discomfort caused by hemorrhoids. Use hemorrhoid cream if your health care provider approves.  Rest with your legs elevated if you have  leg cramps or low back pain.  If you develop varicose veins, wear support hose. Elevate your feet for 15 minutes, 3-4 times a day. Limit salt in your diet. Prenatal Care  Write down your questions. Take them to your prenatal visits.  Keep all your prenatal visits as told by your health care provider. This is important. Safety  Wear your seat belt at all times when driving.  Make a list of emergency phone numbers, including numbers for family, friends, the hospital, and police and fire departments. General instructions  Ask your health care provider for a referral to a local prenatal education class. Begin classes no later than the beginning of month 6 of your pregnancy.  Ask for help if you have counseling or nutritional needs during pregnancy. Your health care provider can offer advice or refer you to specialists for help with various needs.  Do not use hot tubs, steam rooms, or saunas.  Do not douche or use tampons or scented sanitary pads.  Do not cross your legs for long periods of time.  Avoid cat litter boxes and soil  used by cats. These carry germs that can cause birth defects in the baby and possibly loss of the fetus by miscarriage or stillbirth.  Avoid all smoking, herbs, alcohol, and unprescribed drugs. Chemicals in these products can affect the formation and growth of the baby.  Do not use any products that contain nicotine or tobacco, such as cigarettes and e-cigarettes. If you need help quitting, ask your health care provider.  Visit your dentist if you have not gone yet during your pregnancy. Use a soft toothbrush to brush your teeth and be gentle when you floss. Contact a health care provider if:  You have dizziness.  You have mild pelvic cramps, pelvic pressure, or nagging pain in the abdominal area.  You have persistent nausea, vomiting, or diarrhea.  You have a bad smelling vaginal discharge.  You have pain when you urinate. Get help right away if:  You  have a fever.  You are leaking fluid from your vagina.  You have spotting or bleeding from your vagina.  You have severe abdominal cramping or pain.  You have rapid weight gain or weight loss.  You have shortness of breath with chest pain.  You notice sudden or extreme swelling of your face, hands, ankles, feet, or legs.  You have not felt your baby move in over an hour.  You have severe headaches that do not go away when you take medicine.  You have vision changes. Summary  The second trimester is from week 14 through week 27 (months 4 through 6). It is also a time when the fetus is growing rapidly.  Your body goes through many changes during pregnancy. The changes vary from woman to woman.  Avoid all smoking, herbs, alcohol, and unprescribed drugs. These chemicals affect the formation and growth your baby.  Do not use any tobacco products, such as cigarettes, chewing tobacco, and e-cigarettes. If you need help quitting, ask your health care provider.  Contact your health care provider if you have any questions. Keep all prenatal visits as told by your health care provider. This is important. This information is not intended to replace advice given to you by your health care provider. Make sure you discuss any questions you have with your health care provider.      CHILDBIRTH CLASSES [phone] is the phone number for Pregnancy Classes or hospital tours at [place] will be referred to  [email or web address] for more information on childbirth classes  At this site you may register for classes. You may sign up for a waiting list if classes are full. Please SIGN UP FOR THIS!.   When the waiting list becomes long, sometimes new classes can be added.

## 2017-12-01 NOTE — Progress Notes (Signed)
  [id number] [redacted]w[redacted]d Estimated Date of Delivery: 05/07/18  Blood pressure 130/88, pulse 84, weight 243 lb (110.2 kg), last menstrual period 07/31/2017, currently breastfeeding.   BP weight and urine results all reviewed and noted.  Please refer to the obstetrical flow sheet for the fundal height and fetal heart rate documentation:  Patient denies any bleeding and no rupture of membranes symptoms or regular contractions. Patient is without complaints. All questions were answered.   Physical Assessment:   Vitals:   12/01/17 1648  BP: 130/88  Pulse: 84  Weight: 243 lb (110.2 kg)  Body mass index is 41.71 kg/m.        Physical Examination:   General appearance: Well appearing, and in no distress  Mental status: Alert, oriented to person, place, and time  Skin: Warm & dry  Cardiovascular: Normal heart rate noted  Respiratory: Normal respiratory effort, no distress  Abdomen: Soft, gravid, nontender  Pelvic: Cervical exam deferred         Extremities: Edema: None  Fetal Status: Fetal Heart Rate (bpm): 150        Results for orders placed or performed in visit on 12/01/17 (from the past 24 hour(s))  POCT urinalysis dipstick   Collection Time: 12/01/17  4:44 PM  Result Value Ref Range   Color, UA     Clarity, UA     Glucose, UA Negative Negative   Bilirubin, UA     Ketones, UA neg    Spec Grav, UA  1.010 - 1.025   Blood, UA neg    pH, UA  5.0 - 8.0   Protein, UA Negative Negative   Urobilinogen, UA  0.2 or 1.0 E.U./dL   Nitrite, UA neg    Leukocytes, UA Negative Negative   Appearance     Odor    `   Orders Placed This Encounter  Procedures  . US OB Comp + 14 Wk  . POCT urinalysis dipstick    Plan:  Continued routine obstetrical care,   Return in about 2 weeks (around 12/15/2017) for LROB, AV:[id number].

## 2017-12-30 ENCOUNTER — Encounter: Payer: Self-pay | Admitting: Obstetrics & Gynecology

## 2017-12-30 ENCOUNTER — Other Ambulatory Visit (HOSPITAL_COMMUNITY)
Admission: RE | Admit: 2017-12-30 | Discharge: 2017-12-30 | Disposition: A | Discharge status: Home / Self Care | Payer: BLUE CROSS/BLUE SHIELD | Source: Ambulatory Visit | Attending: Obstetrics & Gynecology | Admitting: Obstetrics & Gynecology

## 2017-12-30 ENCOUNTER — Ambulatory Visit (INDEPENDENT_AMBULATORY_CARE_PROVIDER_SITE_OTHER): Payer: BLUE CROSS/BLUE SHIELD | Admitting: Obstetrics & Gynecology

## 2017-12-30 ENCOUNTER — Ambulatory Visit (INDEPENDENT_AMBULATORY_CARE_PROVIDER_SITE_OTHER): Payer: BLUE CROSS/BLUE SHIELD

## 2017-12-30 VITALS — BP 142/92 | HR 80 | Wt 248.0 lb

## 2017-12-30 DIAGNOSIS — Z3A21 21 weeks gestation of pregnancy: Secondary | ICD-10-CM | POA: Insufficient documentation

## 2017-12-30 DIAGNOSIS — Z3402 Encounter for supervision of normal first pregnancy, second trimester: Secondary | ICD-10-CM

## 2017-12-30 DIAGNOSIS — Z331 Pregnant state, incidental: Secondary | ICD-10-CM

## 2017-12-30 DIAGNOSIS — Z1389 Encounter for screening for other disorder: Secondary | ICD-10-CM

## 2017-12-30 DIAGNOSIS — Z363 Encounter for antenatal screening for malformations: Secondary | ICD-10-CM

## 2017-12-30 DIAGNOSIS — Z124 Encounter for screening for malignant neoplasm of cervix: Secondary | ICD-10-CM | POA: Diagnosis not present

## 2017-12-30 DIAGNOSIS — Z3482 Encounter for supervision of other normal pregnancy, second trimester: Secondary | ICD-10-CM | POA: Insufficient documentation

## 2017-12-30 DIAGNOSIS — Z1379 Encounter for other screening for genetic and chromosomal anomalies: Secondary | ICD-10-CM | POA: Diagnosis not present

## 2017-12-30 LAB — POCT URINALYSIS DIPSTICK
Glucose, UA: NEGATIVE
Ketones, UA: NEGATIVE
LEUKOCYTES UA: NEGATIVE
NITRITE UA: NEGATIVE
PROTEIN UA: NEGATIVE
RBC UA: NEGATIVE

## 2017-12-30 NOTE — Progress Notes (Signed)
US 21+5 wks,cephalic,cx 5 cm,anterior pl gr 0,normal right ovary,left oophorectomy,svp of fluid 6.6 cm,LVEICF,fhr 148 bpm,efw 469 g 59%,anatomy complete,no obvious abnormalities

## 2017-12-30 NOTE — Progress Notes (Signed)
[id number] 3174w5d Estimated Date of Delivery: 05/07/18  Blood pressure (!) 142/92, pulse 80, weight 248 lb (112.5 kg), last menstrual period 07/31/2017, currently breastfeeding.   BP weight and urine results all reviewed and noted.  Please refer to the obstetrical flow sheet for the fundal height and fetal heart rate documentation:  Patient reports good fetal movement, denies any bleeding and no rupture of membranes symptoms or regular contractions. Patient is without complaints. All questions were answered.  Orders Placed This Encounter  Procedures  . INTEGRATED 2  . POCT urinalysis dipstick    Plan:  Continued routine obstetrical care, sonogram with isolated LV EICF BP borderline, has been, stable for now Recheck BP 2 weeks  Sonogram normal  Return in about 2 weeks (around 01/13/2018) for LROB, with Dr Despina HiddenEure.

## 2018-01-03 LAB — INTEGRATED 2
ADSF: 0.61
AFP MARKER: 131.3 ng/mL
AFP MOM: 2.77
Crown Rump Length: 72.1 mm
DIA MoM: 3.24
DIA Value: 635.7 pg/mL
ESTRIOL UNCONJUGATED: 1.43 ng/mL
Gest. Age on Collection Date: 13.1 weeks
Gestational Age: 21.6 weeks
HCG MOM: 2.34
HCG VALUE: 34.4 [IU]/mL
Maternal Age at EDD: 31.8 yr
NUMBER OF FETUSES: 1
Nuchal Translucency (NT): 1.5 mm
Nuchal Translucency MoM: 0.91
PAPP-A MoM: 1.45
PAPP-A Value: 947.8 ng/mL
TEST RESULTS: NEGATIVE
WEIGHT: 242 [lb_av]
Weight: 242 [lb_av]

## 2018-01-03 LAB — CYTOLOGY - PAP
Chlamydia: NEGATIVE
DIAGNOSIS: NEGATIVE
HPV: NOT DETECTED
Neisseria Gonorrhea: NEGATIVE

## 2018-01-16 ENCOUNTER — Encounter: Payer: BLUE CROSS/BLUE SHIELD | Admitting: Obstetrics & Gynecology

## 2018-01-19 ENCOUNTER — Ambulatory Visit (INDEPENDENT_AMBULATORY_CARE_PROVIDER_SITE_OTHER): Payer: BLUE CROSS/BLUE SHIELD | Admitting: Obstetrics & Gynecology

## 2018-01-19 ENCOUNTER — Inpatient Hospital Stay (HOSPITAL_COMMUNITY)
Admission: AD | Admit: 2018-01-19 | Discharge: 2018-01-25 | DRG: 788 | Disposition: A | Discharge status: Home / Self Care | Payer: BLUE CROSS/BLUE SHIELD | Source: Ambulatory Visit | Attending: Obstetrics and Gynecology | Admitting: Obstetrics and Gynecology

## 2018-01-19 ENCOUNTER — Encounter (HOSPITAL_COMMUNITY): Payer: Self-pay | Admitting: *Deleted

## 2018-01-19 ENCOUNTER — Other Ambulatory Visit: Payer: Self-pay

## 2018-01-19 VITALS — BP 168/96 | HR 84 | Wt 250.0 lb

## 2018-01-19 DIAGNOSIS — Z803 Family history of malignant neoplasm of breast: Secondary | ICD-10-CM

## 2018-01-19 DIAGNOSIS — O99214 Obesity complicating childbirth: Secondary | ICD-10-CM | POA: Diagnosis present

## 2018-01-19 DIAGNOSIS — O99212 Obesity complicating pregnancy, second trimester: Secondary | ICD-10-CM

## 2018-01-19 DIAGNOSIS — Z833 Family history of diabetes mellitus: Secondary | ICD-10-CM | POA: Diagnosis not present

## 2018-01-19 DIAGNOSIS — Z331 Pregnant state, incidental: Secondary | ICD-10-CM

## 2018-01-19 DIAGNOSIS — K660 Peritoneal adhesions (postprocedural) (postinfection): Secondary | ICD-10-CM | POA: Diagnosis present

## 2018-01-19 DIAGNOSIS — Z8249 Family history of ischemic heart disease and other diseases of the circulatory system: Secondary | ICD-10-CM

## 2018-01-19 DIAGNOSIS — Z23 Encounter for immunization: Secondary | ICD-10-CM | POA: Diagnosis not present

## 2018-01-19 DIAGNOSIS — Z3A25 25 weeks gestation of pregnancy: Secondary | ICD-10-CM

## 2018-01-19 DIAGNOSIS — O34211 Maternal care for low transverse scar from previous cesarean delivery: Secondary | ICD-10-CM | POA: Diagnosis present

## 2018-01-19 DIAGNOSIS — O1490 Unspecified pre-eclampsia, unspecified trimester: Secondary | ICD-10-CM

## 2018-01-19 DIAGNOSIS — O0992 Supervision of high risk pregnancy, unspecified, second trimester: Secondary | ICD-10-CM

## 2018-01-19 DIAGNOSIS — O1492 Unspecified pre-eclampsia, second trimester: Secondary | ICD-10-CM | POA: Diagnosis not present

## 2018-01-19 DIAGNOSIS — O132 Gestational [pregnancy-induced] hypertension without significant proteinuria, second trimester: Secondary | ICD-10-CM | POA: Diagnosis not present

## 2018-01-19 DIAGNOSIS — Z3A24 24 weeks gestation of pregnancy: Secondary | ICD-10-CM | POA: Diagnosis not present

## 2018-01-19 DIAGNOSIS — O458X2 Other premature separation of placenta, second trimester: Secondary | ICD-10-CM | POA: Diagnosis not present

## 2018-01-19 DIAGNOSIS — O2301 Infections of kidney in pregnancy, first trimester: Secondary | ICD-10-CM | POA: Diagnosis present

## 2018-01-19 DIAGNOSIS — O1414 Severe pre-eclampsia complicating childbirth: Principal | ICD-10-CM | POA: Diagnosis present

## 2018-01-19 DIAGNOSIS — O34219 Maternal care for unspecified type scar from previous cesarean delivery: Secondary | ICD-10-CM | POA: Diagnosis present

## 2018-01-19 DIAGNOSIS — O139 Gestational [pregnancy-induced] hypertension without significant proteinuria, unspecified trimester: Secondary | ICD-10-CM | POA: Diagnosis not present

## 2018-01-19 DIAGNOSIS — Z8751 Personal history of pre-term labor: Secondary | ICD-10-CM

## 2018-01-19 DIAGNOSIS — Z1389 Encounter for screening for other disorder: Secondary | ICD-10-CM

## 2018-01-19 DIAGNOSIS — O09219 Supervision of pregnancy with history of pre-term labor, unspecified trimester: Secondary | ICD-10-CM

## 2018-01-19 DIAGNOSIS — Z3490 Encounter for supervision of normal pregnancy, unspecified, unspecified trimester: Secondary | ICD-10-CM

## 2018-01-19 DIAGNOSIS — O43813 Placental infarction, third trimester: Secondary | ICD-10-CM | POA: Diagnosis not present

## 2018-01-19 DIAGNOSIS — Z3402 Encounter for supervision of normal first pregnancy, second trimester: Secondary | ICD-10-CM

## 2018-01-19 LAB — TYPE AND SCREEN
ABO/RH(D): B POS
Antibody Screen: NEGATIVE

## 2018-01-19 LAB — URINALYSIS, ROUTINE W REFLEX MICROSCOPIC
Bilirubin Urine: NEGATIVE
GLUCOSE, UA: NEGATIVE mg/dL
HGB URINE DIPSTICK: NEGATIVE
KETONES UR: 80 mg/dL — AB
Leukocytes, UA: NEGATIVE
NITRITE: NEGATIVE
PH: 6 (ref 5.0–8.0)
Specific Gravity, Urine: 1.024 (ref 1.005–1.030)

## 2018-01-19 LAB — POCT URINALYSIS DIPSTICK
Blood, UA: NEGATIVE
GLUCOSE UA: NEGATIVE
Ketones, UA: NEGATIVE
LEUKOCYTES UA: NEGATIVE
NITRITE UA: NEGATIVE
PROTEIN UA: POSITIVE — AB

## 2018-01-19 LAB — CBC
HEMATOCRIT: 33.9 % — AB (ref 36.0–46.0)
Hemoglobin: 11.4 g/dL — ABNORMAL LOW (ref 12.0–15.0)
MCH: 24.7 pg — ABNORMAL LOW (ref 26.0–34.0)
MCHC: 33.6 g/dL (ref 30.0–36.0)
MCV: 73.5 fL — AB (ref 78.0–100.0)
Platelets: 300 10*3/uL (ref 150–400)
RBC: 4.61 MIL/uL (ref 3.87–5.11)
RDW: 18.5 % — AB (ref 11.5–15.5)
WBC: 9.9 10*3/uL (ref 4.0–10.5)

## 2018-01-19 LAB — COMPREHENSIVE METABOLIC PANEL
ALT: 12 U/L (ref 0–44)
ANION GAP: 10 (ref 5–15)
AST: 18 U/L (ref 15–41)
Albumin: 3.2 g/dL — ABNORMAL LOW (ref 3.5–5.0)
Alkaline Phosphatase: 84 U/L (ref 38–126)
BILIRUBIN TOTAL: 0.5 mg/dL (ref 0.3–1.2)
BUN: 8 mg/dL (ref 6–20)
CO2: 18 mmol/L — ABNORMAL LOW (ref 22–32)
Calcium: 9.2 mg/dL (ref 8.9–10.3)
Chloride: 107 mmol/L (ref 98–111)
Creatinine, Ser: 0.65 mg/dL (ref 0.44–1.00)
GFR calc Af Amer: >60 mL/min (ref >60)
Glucose, Bld: 73 mg/dL (ref 70–99)
Potassium: 3.9 mmol/L (ref 3.5–5.1)
Sodium: 135 mmol/L (ref 135–145)
TOTAL PROTEIN: 7 g/dL (ref 6.5–8.1)

## 2018-01-19 LAB — PROTEIN / CREATININE RATIO, URINE
Creatinine, Urine: 235 mg/dL
PROTEIN CREATININE RATIO: 1.25 mg/mg{creat} — AB (ref 0.00–0.15)
Total Protein, Urine: 294 mg/dL

## 2018-01-19 MED ORDER — PRENATAL MULTIVITAMIN CH
1.0000 | ORAL_TABLET | Freq: Every day | ORAL | Status: DC
  Administered 2018-01-20 – 2018-01-23 (×4): 1 via ORAL
  Filled 2018-01-19 (×4): qty 1

## 2018-01-19 MED ORDER — LABETALOL HCL 5 MG/ML IV SOLN
20.0000 mg | INTRAVENOUS | Status: AC | PRN
  Administered 2018-01-21: 40 mg via INTRAVENOUS
  Administered 2018-01-21: 20 mg via INTRAVENOUS
  Administered 2018-01-21: 80 mg via INTRAVENOUS
  Filled 2018-01-19 (×2): qty 4
  Filled 2018-01-19: qty 16
  Filled 2018-01-19: qty 8

## 2018-01-19 MED ORDER — BETAMETHASONE SOD PHOS & ACET 6 (3-3) MG/ML IJ SUSP
12.0000 mg | INTRAMUSCULAR | Status: AC
  Administered 2018-01-19 – 2018-01-20 (×2): 12 mg via INTRAMUSCULAR
  Filled 2018-01-19 (×2): qty 2

## 2018-01-19 MED ORDER — LABETALOL HCL 5 MG/ML IV SOLN
20.0000 mg | INTRAVENOUS | Status: AC | PRN
  Administered 2018-01-19: 20 mg via INTRAVENOUS
  Administered 2018-01-19: 40 mg via INTRAVENOUS
  Filled 2018-01-19: qty 8
  Filled 2018-01-19: qty 4

## 2018-01-19 MED ORDER — CALCIUM CARBONATE ANTACID 500 MG PO CHEW
2.0000 | CHEWABLE_TABLET | ORAL | Status: DC | PRN
  Administered 2018-01-20: 400 mg via ORAL
  Filled 2018-01-19: qty 2

## 2018-01-19 MED ORDER — DOCUSATE SODIUM 100 MG PO CAPS
100.0000 mg | ORAL_CAPSULE | Freq: Every day | ORAL | Status: DC
  Administered 2018-01-20: 100 mg via ORAL
  Filled 2018-01-19 (×3): qty 1

## 2018-01-19 MED ORDER — HYDRALAZINE HCL 20 MG/ML IJ SOLN
10.0000 mg | Freq: Once | INTRAMUSCULAR | Status: AC | PRN
  Administered 2018-01-23: 10 mg via INTRAVENOUS
  Filled 2018-01-19: qty 1

## 2018-01-19 MED ORDER — HYDRALAZINE HCL 20 MG/ML IJ SOLN
5.0000 mg | INTRAMUSCULAR | Status: AC | PRN
  Administered 2018-01-21: 5 mg via INTRAVENOUS
  Administered 2018-01-23: 10 mg via INTRAVENOUS
  Filled 2018-01-19 (×3): qty 1

## 2018-01-19 MED ORDER — NIFEDIPINE 10 MG PO CAPS
10.0000 mg | ORAL_CAPSULE | Freq: Once | ORAL | Status: AC
  Administered 2018-01-19: 10 mg via ORAL
  Filled 2018-01-19: qty 1

## 2018-01-19 MED ORDER — ZOLPIDEM TARTRATE 5 MG PO TABS
5.0000 mg | ORAL_TABLET | Freq: Every evening | ORAL | Status: DC | PRN
  Administered 2018-01-19 – 2018-01-21 (×2): 5 mg via ORAL
  Filled 2018-01-19 (×3): qty 1

## 2018-01-19 MED ORDER — LACTATED RINGERS IV SOLN
INTRAVENOUS | Status: DC
  Administered 2018-01-19 – 2018-01-23 (×7): via INTRAVENOUS

## 2018-01-19 MED ORDER — ACETAMINOPHEN 325 MG PO TABS
650.0000 mg | ORAL_TABLET | ORAL | Status: DC | PRN
  Administered 2018-01-19 – 2018-01-23 (×8): 650 mg via ORAL
  Filled 2018-01-19 (×10): qty 2

## 2018-01-19 NOTE — MAU Note (Signed)
Pt sent from office with elevated b/p 

## 2018-01-19 NOTE — Progress Notes (Signed)
Patient ID: Kim Duran, female   DOB: Sep 27, 1985, 32 y.o.   MRN: [id number]   [facility] PREGNANCY VISIT Patient name: Kim Duran MRN [id number]  Date of birth: 1985/12/23 Chief Complaint:   Routine Prenatal Visit  History of Present Illness:   Kim Duran is a 32 y.o. [id number] female at [redacted]w[redacted]d with an Estimated Date of Delivery: 05/07/18 being seen today for ongoing management of a high-risk pregnancy complicated by Hypertensive disorder of pregnancy/pre eclmapsia.  Today she reports no complaints. Specifically no headache or visual symptoms Contractions: Irregular. Vag. Bleeding: None.  Movement: Present. denies leaking of fluid.  Review of Systems:   Pertinent items are noted in HPI Denies abnormal vaginal discharge w/ itching/odor/irritation, headaches, visual changes, shortness of breath, chest pain, abdominal pain, severe nausea/vomiting, or problems with urination or bowel movements unless otherwise stated above. Pertinent History Reviewed:  Reviewed past medical,surgical, social, obstetrical and family history.  Reviewed problem list, medications and allergies. Physical Assessment:   Vitals:   01/19/18 1556 01/19/18 1604  BP: (!) 170/100 (!) 168/96  Pulse: 84   Weight: 250 lb (113.4 kg)   Body mass index is 42.91 kg/m.           Physical Examination:   General appearance: alert, well appearing, and in no distress  Mental status: alert, oriented to person, place, and time  Skin: warm & dry   Extremities: Edema: None    Cardiovascular: normal heart rate noted  Respiratory: normal respiratory effort, no distress  Abdomen: gravid, soft, non-tender  Pelvic: Cervical exam deferred         Fetal Status:     Movement: Present    Fetal Surveillance Testing today: FHR 145   Results for orders placed or performed in visit on 01/19/18 (from the past 24 hour(s))  POCT Urinalysis Dipstick   Collection Time: 01/19/18  4:01 PM  Result Value Ref Range   Color, UA      Clarity, UA     Glucose, UA Negative Negative   Bilirubin, UA     Ketones, UA neg    Spec Grav, UA  1.010 - 1.025   Blood, UA neg    pH, UA  5.0 - 8.0   Protein, UA Positive (A) Negative   Urobilinogen, UA  0.2 or 1.0 E.U./dL   Nitrite, UA neg    Leukocytes, UA Negative Negative   Appearance     Odor      Assessment & Plan:  1) High-risk pregnancy [id number] at [redacted]w[redacted]d with an Estimated Date of Delivery: 05/07/18   2) New onset hypertensive disorder of pregnancy, pre eclampsia, unstable  3) Previous C section, VBAC x3,   Meds: No orders of the defined types were placed in this encounter.   Labs/procedures today:   Treatment Plan:  [facility].  This presentation is quite unusual in a woman having her 5th pregnancy with no antecedent hypertensive issue and is the same FOB for all 5.  The family is informed that there is a concern this could be rapidly developing pre eclampsia which will require hospitalization and observation.  Depending on clinical status.  They understand  Reviewed: Preterm labor symptoms and general obstetric precautions including but not limited to vaginal bleeding, contractions, leaking of fluid and fetal movement were reviewed in detail with the patient.  All questions were answered.  Follow-up: Go immediately to the MAU, Dr Alysia Penna has been informed  Orders Placed This Encounter  Procedures  .  POCT Urinalysis Dipstick   Kim Duran  01/19/2018 5:06 PM

## 2018-01-19 NOTE — MAU Provider Note (Signed)
History     CSN: [id number]  Arrival date and time: 01/19/18 1719   None     Chief Complaint  Patient presents with  . Hypertension   HPI  Kim Duran is a 32 y.o. [id number] at [redacted]w[redacted]d sent to MAU from the [facility] office for BP 170/100 and 3+ proteinuria. Denies contractions, vaginal bleeding, leaking of fluid, decreased fetal movement, fever, falls, or recent illness.  Denies headache, RUQ pain, visual disturbances.  Previous c/s with first pregnancy for abruption followed by uncomplicated vaginal deliveries x 3.   OB History    Gravida  5   Para  4   Term  3   Preterm  1   AB      Living  4     SAB      TAB      Ectopic      Multiple  0   Live Births  4           Past Medical History:  Diagnosis Date  . Medical history non-contributory     Past Surgical History:  Procedure Laterality Date  . CESAREAN SECTION    . left ovary and tube removed  2011    Family History  Problem Relation Age of Onset  . Hypertension Mother   . Hypertension Father   . Cancer Paternal Aunt        breast cancer  . Cancer Paternal Aunt        breast  . Cancer Maternal Grandmother        liver  . Cancer Paternal Grandfather   . Cancer Maternal Grandfather        prostate  . Premature birth Daughter        born at 52 weeks  . Diabetes Paternal Aunt   . Cancer Other        breast and ovarian ca. relation unspecified    Social History   Tobacco Use  . Smoking status: Never Smoker  . Smokeless tobacco: Never Used  Substance Use Topics  . Alcohol use: No  . Drug use: No    Allergies: No Known Allergies  Facility-Administered Medications Prior to Admission  Medication Dose Route Frequency Provider Last Rate Last Dose  . cefTRIAXone (ROCEPHIN) 2 g in sodium chloride 0.9 % 100 mL IVPB  2 g Intravenous Once Tilda Burrow, MD       Medications Prior to Admission  Medication Sig Dispense Refill Last Dose  . acetaminophen (TYLENOL) 500 MG tablet  Take 1,000 mg by mouth 2 (two) times daily as needed for moderate pain or fever.   Taking  . cephALEXin (KEFLEX) 500 MG capsule Take 1 capsule (500 mg total) by mouth at bedtime. 30 capsule 8 Taking  . Prenatal Vit-Fe Fumarate-FA (PRENATAL VITAMIN PO) Take by mouth daily.   Taking    Review of Systems  Constitutional: Negative for fever.  Respiratory: Negative for shortness of breath.   Gastrointestinal: Negative for abdominal pain, nausea and vomiting.  Genitourinary: Negative for difficulty urinating, vaginal bleeding, vaginal discharge and vaginal pain.  All other systems reviewed and are negative.  Physical Exam   Height 5\' 4"  (1.626 m), weight 248 lb (112.5 kg), last menstrual period 07/31/2017, currently breastfeeding.  Physical Exam  Nursing note and vitals reviewed. Constitutional: She is oriented to person, place, and time. She appears well-developed and well-nourished.  HENT:  Head: Normocephalic.  Cardiovascular: Normal rate, regular rhythm, normal heart sounds and intact distal pulses.  Respiratory: Effort normal and breath sounds normal.  GI:  Gravid, nontender  Musculoskeletal: Normal range of motion.  Neurological: She is alert and oriented to person, place, and time. She has normal reflexes.  Skin: Skin is warm and dry.  Psychiatric: She has a normal mood and affect. Her behavior is normal. Judgment and thought content normal.    MAU Course  Procedures  MDM --Severe range pressures --Absence of physical symptoms --Continuous monitoring compromised by GA. EFM baseline 150 with 10x10 accels at 1757, moderate variability, variable decel as expected for GA --Toco: minimal uterine irritability  Patient Vitals for the past 24 hrs:  Height Weight  01/19/18 1733 5\' 4"  (1.626 m) 248 lb (112.5 kg)    Orders Placed This Encounter  Procedures  . Urinalysis, Routine w reflex microscopic  . Protein / creatinine ratio, urine  . CBC  . Comprehensive metabolic panel   . Check blood pressure 10 minutes after giving labetalol 40 mg IV dose. Call MD if SBP >/= 160 and/or DBP >/= 110.  . Once BP goal is reached, repeat BP every 10 minutes for 1 hour, then every 15 minutes for 1 hours, then per policy for antepartum labor or post-partum.   Results for orders placed or performed during the hospital encounter of 01/19/18 (from the past 24 hour(s))  Urinalysis, Routine w reflex microscopic     Status: Abnormal   Collection Time: 01/19/18  5:54 PM  Result Value Ref Range   Color, Urine YELLOW YELLOW   APPearance HAZY (A) CLEAR   Specific Gravity, Urine 1.024 1.005 - 1.030   pH 6.0 5.0 - 8.0   Glucose, UA NEGATIVE NEGATIVE mg/dL   Hgb urine dipstick NEGATIVE NEGATIVE   Bilirubin Urine NEGATIVE NEGATIVE   Ketones, ur 80 (A) NEGATIVE mg/dL   Protein, ur >=161 (A) NEGATIVE mg/dL   Nitrite NEGATIVE NEGATIVE   Leukocytes, UA NEGATIVE NEGATIVE   RBC / HPF 0-5 0 - 5 RBC/hpf   WBC, UA 0-5 0 - 5 WBC/hpf   Bacteria, UA MANY (A) NONE SEEN   Squamous Epithelial / LPF 0-5 0 - 5   Mucus PRESENT   Protein / creatinine ratio, urine     Status: None (Preliminary result)   Collection Time: 01/19/18  5:54 PM  Result Value Ref Range   Creatinine, Urine 235.00 mg/dL   Total Protein, Urine PENDING mg/dL   Protein Creatinine Ratio PENDING 0.00 - 0.15 mg/mg[Cre]  CBC     Status: Abnormal   Collection Time: 01/19/18  6:20 PM  Result Value Ref Range   WBC 9.9 4.0 - 10.5 K/uL   RBC 4.61 3.87 - 5.11 MIL/uL   Hemoglobin 11.4 (L) 12.0 - 15.0 g/dL   HCT 09.6 (L) 04.5 - 40.9 %   MCV 73.5 (L) 78.0 - 100.0 fL   MCH 24.7 (L) 26.0 - 34.0 pg   MCHC 33.6 30.0 - 36.0 g/dL   RDW 81.1 (H) 91.4 - 78.2 %   Platelets 300 150 - 400 K/uL  Comprehensive metabolic panel     Status: Abnormal   Collection Time: 01/19/18  6:20 PM  Result Value Ref Range   Sodium 135 135 - 145 mmol/L   Potassium 3.9 3.5 - 5.1 mmol/L   Chloride 107 98 - 111 mmol/L   CO2 18 (L) 22 - 32 mmol/L   Glucose, Bld 73  70 - 99 mg/dL   BUN 8 6 - 20 mg/dL   Creatinine, Ser 9.56 0.44 - 1.00 mg/dL  Calcium 9.2 8.9 - 10.3 mg/dL   Total Protein 7.0 6.5 - 8.1 g/dL   Albumin 3.2 (L) 3.5 - 5.0 g/dL   AST 18 15 - 41 U/L   ALT 12 0 - 44 U/L   Alkaline Phosphatase 84 38 - 126 U/L   Total Bilirubin 0.5 0.3 - 1.2 mg/dL   GFR calc non Af Amer >60 >60 mL/min   GFR calc Af Amer >60 >60 mL/min   Anion gap 10 5 - 15     Assessment and Plan  --32 y.o. [id number] at 5517w4d  --Severe range pressures responding to IV Labetalol --Normal EFM baseline with moderate variability, 10x10 accels, appropriate for gestational age --Admit to Antepartum service for monitoring and 24 hour urine, orders placed  Presentation, clinical findings, and plan discussed with Dr. Nancie NeasHarraway-Smith   Gail Vendetti C Sylvain Hasten, CNM 01/19/2018, 7:37 PM

## 2018-01-20 ENCOUNTER — Inpatient Hospital Stay (HOSPITAL_BASED_OUTPATIENT_CLINIC_OR_DEPARTMENT_OTHER): Payer: BLUE CROSS/BLUE SHIELD

## 2018-01-20 DIAGNOSIS — O1492 Unspecified pre-eclampsia, second trimester: Secondary | ICD-10-CM | POA: Diagnosis not present

## 2018-01-20 DIAGNOSIS — O99212 Obesity complicating pregnancy, second trimester: Secondary | ICD-10-CM | POA: Diagnosis not present

## 2018-01-20 DIAGNOSIS — O139 Gestational [pregnancy-induced] hypertension without significant proteinuria, unspecified trimester: Secondary | ICD-10-CM | POA: Diagnosis not present

## 2018-01-20 LAB — PROTEIN, URINE, 24 HOUR
Collection Interval-UPROT: 24 hours
PROTEIN, URINE: 56 mg/dL
Protein, 24H Urine: 1834 mg/d — ABNORMAL HIGH (ref 50–100)
Urine Total Volume-UPROT: 3275 mL

## 2018-01-20 MED ORDER — MAGNESIUM SULFATE BOLUS VIA INFUSION
4.0000 g | Freq: Once | INTRAVENOUS | Status: AC
  Administered 2018-01-20: 4 g via INTRAVENOUS
  Filled 2018-01-20: qty 500

## 2018-01-20 MED ORDER — MAGNESIUM SULFATE 40 G IN LACTATED RINGERS - SIMPLE
2.0000 g/h | INTRAVENOUS | Status: AC
  Administered 2018-01-20: 2 g/h via INTRAVENOUS
  Filled 2018-01-20: qty 40
  Filled 2018-01-20: qty 500

## 2018-01-20 MED ORDER — LABETALOL HCL 200 MG PO TABS
200.0000 mg | ORAL_TABLET | Freq: Two times a day (BID) | ORAL | Status: DC
  Administered 2018-01-20 (×3): 200 mg via ORAL
  Filled 2018-01-20 (×3): qty 1

## 2018-01-20 NOTE — Consult Note (Signed)
Neonatology Consult to Antenatal Patient: 01/20/2018 5:52 PM    I was requested by Dr Erin FullingHarraway-Smith to see this patient in order to provide antenatal counseling due to elevated BP at 24 5/[redacted] weeks gestation.    Ms.Slee is a 32 y/o G5P3 who was admitted yesterday for new onset hypertensive disease of pregnancy.  She is currently "not" having active labor. Pregnancy complicated by pyelonephritis in the first trimester.  She completed her course of BMZ, on MgSO4 and Labetalol.   I spoke with Ms.Clinton SawyerWilliamson and FOB in Room 302.   We discussed in detail what to expect in case of possible delivery of the infant in the next few days including morbidity and mortality at this gestational age, usual delivery room resuscitation including intubation and surfactant administration in the DR.  Discussed possible respiratory complications and need for support including mechanical ventilation, IV access, sepsis work-up, NG/OG feedings ( benefits of BF and DBM availability ---- MOB breast fed all her 4 other kids), risk for IVH with the potential for motor/cognitive deficits, length of stay and long-term outcome.  They were attentive, had appropriate questions, and expressed appreciation for my input.   I offered a NICU tour to FOB and would be glad to come back if she has more questions later.  Thank you for asking us to see this patient and allowing us to participate in her care.  Please call if there are any further questions.   Overton MamMary Ann T Rosaland Shiffman, MD (Attending Neonatologist)  Total length of face-to-face or floor/unit time for this encounter was 40 minutes. Counseling and/or coordination of care was greater than fifty percent of the time.

## 2018-01-20 NOTE — Progress Notes (Signed)
Patient ID: Kim Duran, female   DOB: 1985/09/15, 32 y.o.   MRN: [id number] ACULTY PRACTICE ANTEPARTUM COMPREHENSIVE PROGRESS NOTE  Kim Duran is a 32 y.o. [id number] at [redacted]w[redacted]d  who is admitted for eval of elevated BPs- preeclampsia with severe features.    Fetal presentation is unsure. Length of Stay:  1  Days  Subjective: Pt reports HA this am. No SOB, no visual changes. Patient reports good fetal movement.  She reports no uterine contractions, no bleeding and no loss of fluid per vagina.  Vitals:  Blood pressure (!) 145/85, pulse 99, temperature 98.5 F (36.9 C), temperature source Oral, resp. rate 18, height 5\' 4"  (1.626 m), weight 248 lb (112.5 kg), last menstrual period 07/31/2017, SpO2 99 %, currently breastfeeding. Physical Examination: General appearance - alert, well appearing, and in no distress Chest - clear to auscultation, no wheezes, rales or rhonchi, symmetric air entry Heart - normal rate, regular rhythm, normal S1, S2, no murmurs, rubs, clicks or gallops Extremities - peripheral pulses normal, no pedal edema, no clubbing or cyanosis Cervical Exam: Not evaluated. Membranes:intact  Fetal Monitoring:  Baseline: 140's bpm, Variability: Good {> 6 bpm), Accelerations: Non-reactive but appropriate for gestational age and Decelerations: some variable decels. Good return to baseline.  Labs:  Results for orders placed or performed during the hospital encounter of 01/19/18 (from the past 24 hour(s))  Urinalysis, Routine w reflex microscopic   Collection Time: 01/19/18  5:54 PM  Result Value Ref Range   Color, Urine YELLOW YELLOW   APPearance HAZY (A) CLEAR   Specific Gravity, Urine 1.024 1.005 - 1.030   pH 6.0 5.0 - 8.0   Glucose, UA NEGATIVE NEGATIVE mg/dL   Hgb urine dipstick NEGATIVE NEGATIVE   Bilirubin Urine NEGATIVE NEGATIVE   Ketones, ur 80 (A) NEGATIVE mg/dL   Protein, ur >=416 (A) NEGATIVE mg/dL   Nitrite NEGATIVE NEGATIVE   Leukocytes, UA NEGATIVE  NEGATIVE   RBC / HPF 0-5 0 - 5 RBC/hpf   WBC, UA 0-5 0 - 5 WBC/hpf   Bacteria, UA MANY (A) NONE SEEN   Squamous Epithelial / LPF 0-5 0 - 5   Mucus PRESENT   Protein / creatinine ratio, urine   Collection Time: 01/19/18  5:54 PM  Result Value Ref Range   Creatinine, Urine 235.00 mg/dL   Total Protein, Urine 294 mg/dL   Protein Creatinine Ratio 1.25 (H) 0.00 - 0.15 mg/mg[Cre]  CBC   Collection Time: 01/19/18  6:20 PM  Result Value Ref Range   WBC 9.9 4.0 - 10.5 K/uL   RBC 4.61 3.87 - 5.11 MIL/uL   Hemoglobin 11.4 (L) 12.0 - 15.0 g/dL   HCT 60.6 (L) 30.1 - 60.1 %   MCV 73.5 (L) 78.0 - 100.0 fL   MCH 24.7 (L) 26.0 - 34.0 pg   MCHC 33.6 30.0 - 36.0 g/dL   RDW 09.3 (H) 23.5 - 57.3 %   Platelets 300 150 - 400 K/uL  Comprehensive metabolic panel   Collection Time: 01/19/18  6:20 PM  Result Value Ref Range   Sodium 135 135 - 145 mmol/L   Potassium 3.9 3.5 - 5.1 mmol/L   Chloride 107 98 - 111 mmol/L   CO2 18 (L) 22 - 32 mmol/L   Glucose, Bld 73 70 - 99 mg/dL   BUN 8 6 - 20 mg/dL   Creatinine, Ser 2.20 0.44 - 1.00 mg/dL   Calcium 9.2 8.9 - 25.4 mg/dL   Total Protein 7.0 6.5 - 8.1 g/dL  Albumin 3.2 (L) 3.5 - 5.0 g/dL   AST 18 15 - 41 U/L   ALT 12 0 - 44 U/L   Alkaline Phosphatase 84 38 - 126 U/L   Total Bilirubin 0.5 0.3 - 1.2 mg/dL   GFR calc non Af Amer >60 >60 mL/min   GFR calc Af Amer >60 >60 mL/min   Anion gap 10 5 - 15  Type and screen [facility]   Collection Time: 01/19/18  6:20 PM  Result Value Ref Range   ABO/RH(D) B POS    Antibody Screen NEG    Sample Expiration      01/22/2018 Performed at [facility], [address] [place], [place] [address]   Results for orders placed or performed in visit on 01/19/18 (from the past 24 hour(s))  POCT Urinalysis Dipstick   Collection Time: 01/19/18  4:01 PM  Result Value Ref Range   Color, UA     Clarity, UA     Glucose, UA Negative Negative   Bilirubin, UA     Ketones, UA neg    Spec Grav, UA   1.010 - 1.025   Blood, UA neg    pH, UA  5.0 - 8.0   Protein, UA Positive (A) Negative   Urobilinogen, UA  0.2 or 1.0 E.U./dL   Nitrite, UA neg    Leukocytes, UA Negative Negative   Appearance     Odor      Imaging Studies:    none  Medications:  Scheduled . betamethasone acetate-betamethasone sodium phosphate  12 mg Intramuscular Q24 Hr x 2  . docusate sodium  100 mg Oral Daily  . labetalol  200 mg Oral BID  . prenatal multivitamin  1 tablet Oral Q1200   I have reviewed the patient's current medications.  ASSESSMENT: Patient Active Problem List   Diagnosis Date Noted  . Preeclampsia 01/19/2018  . Supervision of normal pregnancy 10/27/2017  . Pyelonephritis affecting pregnancy in first trimester 10/12/2017  . History of preterm delivery, currently pregnant 04/10/2014  . Previous cesarean section complicating pregnancy, antepartum condition or complication 09/27/2012    PLAN: NICU consult  US for growth and possibly dopplers Keep Labetolol 200mg  bid 2nd dose of BMZ due this afternoon Watch for s/sx of worsening ds. Deliver for maternal or fetal indications.     Completing 24 hour urein for protein Continue routine antenatal care.  Rondo Spittler Harraway-Smith 01/20/2018,8:49 AM

## 2018-01-20 NOTE — Progress Notes (Signed)
Patient up to bathroom at 2140, now sitting up eating.

## 2018-01-20 NOTE — H&P (Signed)
FACULTY PRACTICE ANTEPARTUM ADMISSION HISTORY AND PHYSICAL NOTE   History of Present Illness: Kim Duran is a 32 y.o. [id number] at [redacted]w[redacted]d admitted for new onset hypertensive disease of pregnancy Patient reports the fetal movement as active. Patient reports uterine contraction  None Patient reports  vaginal bleeding as none. Patient describes fluid per vagina as None. Fetal presentation is unsure.  Patient Active Problem List   Diagnosis Date Noted  . Preeclampsia 01/19/2018  . Supervision of normal pregnancy 10/27/2017  . Pyelonephritis affecting pregnancy in first trimester 10/12/2017  . History of preterm delivery, currently pregnant 04/10/2014  . Previous cesarean section complicating pregnancy, antepartum condition or complication 09/27/2012    Past Medical History:  Diagnosis Date  . Medical history non-contributory     Past Surgical History:  Procedure Laterality Date  . CESAREAN SECTION    . left ovary and tube removed  2011    OB History  Gravida Para Term Preterm AB Living  5 4 3 1   4   SAB TAB Ectopic Multiple Live Births        0 4    # Outcome Date GA Lbr Len/2nd Weight Sex Delivery Anes PTL Lv  5 Current           4 Term 07/30/14 [redacted]w[redacted]d 64:02 / 00:19 8 lb 5.9 oz (3.795 kg) M VBAC EPI N LIV  3 Term 10/17/12 [redacted]w[redacted]d 24:30 / 00:27 7 lb 15.5 oz (3.615 kg) M VBAC EPI N LIV     Birth Comments: No anomalies noted  2 Term 01/09/11 [redacted]w[redacted]d 10:00 7 lb 10 oz (3.459 kg) F VBAC EPI N LIV  1 Preterm 10/17/07 [redacted]w[redacted]d  4 lb (1.814 kg) F CS-LTranv Gen Y LIV     Complications: Placental abruption    Social History   Socioeconomic History  . Marital status: Married    Spouse name: Not on file  . Number of children: Not on file  . Years of education: Not on file  . Highest education level: Not on file  Occupational History  . Not on file  Social Needs  . Financial resource strain: Not on file  . Food insecurity:    Worry: Not on file    Inability: Not on file  .  Transportation needs:    Medical: Not on file    Non-medical: Not on file  Tobacco Use  . Smoking status: Never Smoker  . Smokeless tobacco: Never Used  Substance and Sexual Activity  . Alcohol use: No  . Drug use: No  . Sexual activity: Yes    Birth control/protection: None  Lifestyle  . Physical activity:    Days per week: Not on file    Minutes per session: Not on file  . Stress: Not on file  Relationships  . Social connections:    Talks on phone: Not on file    Gets together: Not on file    Attends religious service: Not on file    Active member of club or organization: Not on file    Attends meetings of clubs or organizations: Not on file    Relationship status: Not on file  Other Topics Concern  . Not on file  Social History Narrative  . Not on file    Family History  Problem Relation Age of Onset  . Hypertension Mother   . Hypertension Father   . Cancer Paternal Aunt        breast cancer  . Cancer Paternal Aunt  breast  . Cancer Maternal Grandmother        liver  . Cancer Paternal Grandfather   . Cancer Maternal Grandfather        prostate  . Premature birth Daughter        born at 69 weeks  . Diabetes Paternal Aunt   . Cancer Other        breast and ovarian ca. relation unspecified    No Known Allergies  Facility-Administered Medications Prior to Admission  Medication Dose Route Frequency Provider Last Rate Last Dose  . cefTRIAXone (ROCEPHIN) 2 g in sodium chloride 0.9 % 100 mL IVPB  2 g Intravenous Once Tilda Burrow, MD       Medications Prior to Admission  Medication Sig Dispense Refill Last Dose  . acetaminophen (TYLENOL) 500 MG tablet Take 1,000 mg by mouth 2 (two) times daily as needed for moderate pain or fever.   Taking  . cephALEXin (KEFLEX) 500 MG capsule Take 1 capsule (500 mg total) by mouth at bedtime. 30 capsule 8 Taking  . Prenatal Vit-Fe Fumarate-FA (PRENATAL VITAMIN PO) Take by mouth daily.   Taking    Review of Systems  - Negative except new onset HA started today. Resolved spontaneously.   Vitals:  BP (!) 155/90 (BP Location: Right Arm)   Pulse 75   Temp 98.6 F (37 C) (Oral)   Resp 20   Ht 5\' 4"  (1.626 m)   Wt 248 lb (112.5 kg)   LMP 07/31/2017 (Approximate)   SpO2 99%   BMI 42.57 kg/m  Physical Examination: GENERAL: Well-developed, well-nourished female in no acute distress.  LUNGS: Clear to auscultation bilaterally.  HEART: Regular rate and rhythm. ABDOMEN: Soft, nontender, nondistended. No organomegaly. EXTREMITIES: No cyanosis, clubbing, or edema, 2+ distal pulses with DTRs 1+ bilaterally CERVIX: Not evaluated.  Membranes:intact Fetal Monitoring:Baseline: 150's bpm, Variability: Good {> 6 bpm) and Accelerations: Non-reactive but appropriate for gestational age Tocometer: Flat  Labs:  Results for orders placed or performed during the hospital encounter of 01/19/18 (from the past 24 hour(s))  Urinalysis, Routine w reflex microscopic   Collection Time: 01/19/18  5:54 PM  Result Value Ref Range   Color, Urine YELLOW YELLOW   APPearance HAZY (A) CLEAR   Specific Gravity, Urine 1.024 1.005 - 1.030   pH 6.0 5.0 - 8.0   Glucose, UA NEGATIVE NEGATIVE mg/dL   Hgb urine dipstick NEGATIVE NEGATIVE   Bilirubin Urine NEGATIVE NEGATIVE   Ketones, ur 80 (A) NEGATIVE mg/dL   Protein, ur >=161 (A) NEGATIVE mg/dL   Nitrite NEGATIVE NEGATIVE   Leukocytes, UA NEGATIVE NEGATIVE   RBC / HPF 0-5 0 - 5 RBC/hpf   WBC, UA 0-5 0 - 5 WBC/hpf   Bacteria, UA MANY (A) NONE SEEN   Squamous Epithelial / LPF 0-5 0 - 5   Mucus PRESENT   Protein / creatinine ratio, urine   Collection Time: 01/19/18  5:54 PM  Result Value Ref Range   Creatinine, Urine 235.00 mg/dL   Total Protein, Urine 294 mg/dL   Protein Creatinine Ratio 1.25 (H) 0.00 - 0.15 mg/mg[Cre]  CBC   Collection Time: 01/19/18  6:20 PM  Result Value Ref Range   WBC 9.9 4.0 - 10.5 K/uL   RBC 4.61 3.87 - 5.11 MIL/uL   Hemoglobin 11.4 (L) 12.0 - 15.0  g/dL   HCT 09.6 (L) 04.5 - 40.9 %   MCV 73.5 (L) 78.0 - 100.0 fL   MCH 24.7 (L) 26.0 - 34.0  pg   MCHC 33.6 30.0 - 36.0 g/dL   RDW 16.118.5 (H) 09.611.5 - 04.515.5 %   Platelets 300 150 - 400 K/uL  Comprehensive metabolic panel   Collection Time: 01/19/18  6:20 PM  Result Value Ref Range   Sodium 135 135 - 145 mmol/L   Potassium 3.9 3.5 - 5.1 mmol/L   Chloride 107 98 - 111 mmol/L   CO2 18 (L) 22 - 32 mmol/L   Glucose, Bld 73 70 - 99 mg/dL   BUN 8 6 - 20 mg/dL   Creatinine, Ser 4.090.65 0.44 - 1.00 mg/dL   Calcium 9.2 8.9 - 81.110.3 mg/dL   Total Protein 7.0 6.5 - 8.1 g/dL   Albumin 3.2 (L) 3.5 - 5.0 g/dL   AST 18 15 - 41 U/L   ALT 12 0 - 44 U/L   Alkaline Phosphatase 84 38 - 126 U/L   Total Bilirubin 0.5 0.3 - 1.2 mg/dL   GFR calc non Af Amer >60 >60 mL/min   GFR calc Af Amer >60 >60 mL/min   Anion gap 10 5 - 15  Type and screen [facility]   Collection Time: 01/19/18  6:20 PM  Result Value Ref Range   ABO/RH(D) B POS    Antibody Screen NEG    Sample Expiration      01/22/2018 Performed at [facility], [address] [place], [place] [address]   Results for orders placed or performed in visit on 01/19/18 (from the past 24 hour(s))  POCT Urinalysis Dipstick   Collection Time: 01/19/18  4:01 PM  Result Value Ref Range   Color, UA     Clarity, UA     Glucose, UA Negative Negative   Bilirubin, UA     Ketones, UA neg    Spec Grav, UA  1.010 - 1.025   Blood, UA neg    pH, UA  5.0 - 8.0   Protein, UA Positive (A) Negative   Urobilinogen, UA  0.2 or 1.0 E.U./dL   Nitrite, UA neg    Leukocytes, UA Negative Negative   Appearance     Odor      Imaging Studies: Kore[place] Ob Comp + 14 Wk  Result Date: 12/30/2017  SECOND TRIMESTER SONOGRAM Herbie DrapeBelinda Covey is in the office for  second trimester sonogram. She is a 32 y.o. year old [id number] with Estimated Date of Delivery: 05/07/18 by LMP now at  609w5d weeks gestation. Thus far the pregnancy has been complicated by hx of  previous preterm delivery. GESTATION: SINGLETON PRESENTATION: cephalic FETAL ACTIVITY:          Heart rate         148          The fetus is active. AMNIOTIC FLUID: The amniotic fluid volume is  normal, SDP : 6.6 cm. PLACENTA LOCALIZATION:  anterior GRADE 0 CERVIX: Measures 5 cm ADNEXA: Normal right ovary,left oophorectomy GESTATIONAL AGE AND  BIOMETRICS: Gestational criteria: Estimated Date of Delivery: 05/07/18 by LMP now at 609w5d Previous Scans:2          BIPARIETAL DIAMETER           4.90 cm         20+5 weeks HEAD CIRCUMFERENCE           19.74 cm         21+6 weeks ABDOMINAL CIRCUMFERENCE           16.64 cm         21+4  weeks FEMUR LENGTH           3.91 cm         22+4 weeks                                                       AVERAGE EGA(BY THIS SCAN):  21+3 weeks                                                 ESTIMATED FETAL WEIGHT:       469  grams, 59 % ANATOMICAL SURVEY                                                                            COMMENTS CEREBRAL VENTRICLES yes normal  CHOROID PLEXUS yes normal  CEREBELLUM yes normal  CISTERNA MAGNA yes normal  NUCHAL REGION yes normal  ORBITS yes normal  NASAL BONE yes normal  NOSE/LIP yes normal  FACIAL PROFILE yes normal  4 CHAMBERED HEART yes normal LVEICF OUTFLOW TRACTS yes normal  DIAPHRAGM yes normal  STOMACH yes normal  RENAL REGION yes normal  BLADDER yes normal  CORD INSERTION yes normal  3 VESSEL CORD yes normal  SPINE yes normal  ARMS/HANDS yes normal  LEGS/FEET yes normal  GENITALIA yes normal female     SUSPECTED ABNORMALITIES:  no QUALITY OF SCAN: satisfactory TECHNICIAN COMMENTS: [place] 21+5 wks,cephalic,cx 5 cm,anterior pl gr 0,normal right ovary,left oophorectomy,svp of fluid 6.6 cm,LVEICF,fhr 148 bpm,efw 469 g 59%,anatomy complete,no obvious abnormalities A copy of this report including all images has been saved and backed up to a second source for retrieval if needed. All measures and details of the anatomical scan, placentation, fluid volume  and pelvic anatomy are contained in that report. Amber Flora Lipps 12/30/2017 10:57 AM Clinical Impression and recommendations: I have reviewed the sonogram results above, combined with the patient's current clinical course, below are my impressions and any appropriate recommendations for management based on the sonographic findings. 1.  [id number] Estimated Date of Delivery: 05/07/18 by  LMP, early ultrasound and confirmed by today's sonographic dating 2.  Isolated LV EICF, otherwise, Normal fetal sonographic findings, specifically normal detailed anatomical evaluation,      no abnormalities noted 3.  Normal general sonographic findings Recommend routine prenatal care based on this sonogram or as clinically indicated Lazaro Arms 12/30/2017 11:06 AM     Prenatal Transfer Tool  Maternal Diabetes: No Genetic Screening: Normal Maternal Ultrasounds/Referrals: Abnormal:  Findings:   Isolated EIF (echogenic intracardiac focus) Fetal Ultrasounds or other Referrals:  None Maternal Substance Abuse:  No Significant Maternal Medications:  None Significant Maternal Lab Results: None   Assessment and Plan: Patient Active Problem List   Diagnosis Date Noted  . Preeclampsia 01/19/2018  . Supervision of normal pregnancy 10/27/2017  . Pyelonephritis affecting pregnancy in first trimester 10/12/2017  . History of preterm delivery, currently pregnant 04/10/2014  .  Previous cesarean section complicating pregnancy, antepartum condition or complication 09/27/2012   Admit to Antenatal Betamethasone x 2 doses Magnesium sulfate 24 hour urine for protein Begin labetalol 200mg  bid MFM [place] for growth in am   Will recheck presentation if she does progress in preterm labor to determine route of delivery Routine antenatal care  Kajsa Butrum L. Harraway-Smith, M.D., [place]

## 2018-01-20 NOTE — Progress Notes (Signed)
I received a consult from pt's nurse due to pt having a difficult time being away from her kids for the first time. I spent time with pt and her family, 4 kids, and her husband.  She is glad that they can spend time with her at the hospital, but sad to be away from them.  Over all, they have strong family support from extended family as well.    Chaplain Dyanne CarrelKaty Amahri Dengel, Bcc Pager, [phone] 3:38 PM    01/20/18 1500  Clinical Encounter Type  Visited With Patient and family together  Visit Type Spiritual support  Referral From Nurse  Spiritual Encounters  Spiritual Needs Emotional

## 2018-01-21 DIAGNOSIS — Z3A24 24 weeks gestation of pregnancy: Secondary | ICD-10-CM

## 2018-01-21 DIAGNOSIS — O1492 Unspecified pre-eclampsia, second trimester: Secondary | ICD-10-CM

## 2018-01-21 LAB — CULTURE, OB URINE

## 2018-01-21 MED ORDER — LABETALOL HCL 200 MG PO TABS
200.0000 mg | ORAL_TABLET | Freq: Once | ORAL | Status: AC
  Administered 2018-01-21: 200 mg via ORAL
  Filled 2018-01-21: qty 1

## 2018-01-21 MED ORDER — LABETALOL HCL 200 MG PO TABS
400.0000 mg | ORAL_TABLET | Freq: Three times a day (TID) | ORAL | Status: DC
  Administered 2018-01-21: 400 mg via ORAL
  Filled 2018-01-21: qty 2

## 2018-01-21 MED ORDER — LABETALOL HCL 200 MG PO TABS
200.0000 mg | ORAL_TABLET | Freq: Three times a day (TID) | ORAL | Status: DC
  Administered 2018-01-21 (×2): 200 mg via ORAL
  Filled 2018-01-21 (×2): qty 1

## 2018-01-21 NOTE — Progress Notes (Signed)
Ultrasound adjusted to better graph FHR.

## 2018-01-21 NOTE — Progress Notes (Signed)
Ultrasound was adjusted after patient finished eating. FHR tracing difficult to maintain because of patient habitus and frequent movement.

## 2018-01-21 NOTE — Progress Notes (Signed)
Patient ID: Kim DrapeBelinda Makara, female   DOB: 11-09-1985, 32 y.o.   MRN: [id number] FACULTY PRACTICE ANTEPARTUM(COMPREHENSIVE) NOTE  Kim Duran is a 32 y.o. [id number] with Estimated Date of Delivery: 05/07/18   By  LMP, early ultrasound 1854w6d  who is admitted for pre eclampsia.    Fetal presentation is cephalic. Length of Stay:  2  Days  Date of admission:01/19/2018  Subjective: No headache or visual changes Patient reports the fetal movement as active. Patient reports uterine contraction  activity as none. Patient reports  vaginal bleeding as none. Patient describes fluid per vagina as None.  Vitals:  Blood pressure (!) 149/94, pulse 86, temperature 98.5 F (36.9 C), resp. rate 16, height 5\' 4"  (1.626 m), weight 254 lb (115.2 kg), last menstrual period 07/31/2017, SpO2 98 %, currently breastfeeding. Vitals:   01/21/18 0200 01/21/18 0355 01/21/18 0400 01/21/18 0500  BP:  (!) 149/94    Pulse:  86    Resp: 16 16 16 16   Temp:  98.5 F (36.9 C)    TempSrc:      SpO2:  98%    Weight:  254 lb (115.2 kg)    Height:       Physical Examination:  General appearance - alert, well appearing, and in no distress Abdomen - soft, nontender, nondistended, no masses or organomegaly Fundal Height:  size equals dates Extremities: extremities normal, atraumatic, no cyanosis or edema with DTRs 2+ bilaterally Membranes:intact  Fetal Monitoring:  Reassuring for EGA     Labs:  No results found for this or any previous visit (from the past 24 hour(s)).  Imaging Studies:     OBSTETRICS REPORT                      (Signed Final 01/20/2018 05:36 pm) ---------------------------------------------------------------------- Patient Info  ID #:       [id number]                          D.O.B.:  Aug 16, 1985 (31 yrs)  Name:       Kim Duran              Visit Date: 01/20/2018 04:04 pm ---------------------------------------------------------------------- Performed By  Performed By:     Hurman HornAlyssa  Keatts          Ref. Address:     [address]                    [address]                                                             [address]                                                             [place], [place]  [address]  Attending:        Noralee Space MD        Location:         [facility]  Referred By:      Hermina Staggers                    MD ---------------------------------------------------------------------- Orders   #  Description                                 Code   1  [place] MFM OB DETAIL +14 WK                     76811.01  ----------------------------------------------------------------------   #  Ordered By               Order #        Accession #    Episode #   1  Nettie Elm            [id number]      [id number]     [id number]  ---------------------------------------------------------------------- Indications   [redacted] weeks gestation of pregnancy                Z3A.24   Encounter for antenatal screening for          Z36.3   malformations   Hypertension - Gestational (pre-eclampsia)     O13.9   Maternal morbid obesity                        O99.210 E66.01  ---------------------------------------------------------------------- OB History  Blood Type:            Height:  5'4"   Weight (lb):  248       BMI:  42.56  Gravidity:    5         Term:   3        Prem:   1        SAB:   0  TOP:          0       Ectopic:  0        Living: 4 ---------------------------------------------------------------------- Fetal Evaluation  Num Of Fetuses:     1  Fetal Heart         141  Rate(bpm):  Cardiac Activity:   Observed  Presentation:       Cephalic  Placenta:           Anterior  P. Cord Insertion:  Visualized, central  Amniotic Fluid  AFI FV:      Subjectively within normal limits                              Largest Pocket(cm)                               6.43 ---------------------------------------------------------------------- Biometry  BPD:      58.9  mm     G. Age:  24w 0d         21  %    CI:        73.61   %    70 - 86  FL/HC:      20.4   %    18.7 - 20.3  HC:      218.1  mm     G. Age:  23w 6d          8  %    HC/AC:      1.10        1.04 - 1.22  AC:      198.8  mm     G. Age:  24w 4d         36  %    FL/BPD:     75.6   %    71 - 87  FL:       44.5  mm     G. Age:  24w 5d         36  %    FL/AC:      22.4   %    20 - 24  Est. FW:     702  gm      1 lb 9 oz     48  % ---------------------------------------------------------------------- Gestational Age  LMP:           24w 5d        Date:  07/31/17                 EDD:   05/07/18  U/S Today:     24w 2d                                        EDD:   05/10/18  Best:          24w 5d     Det. By:  LMP  (07/31/17)          EDD:   05/07/18 ---------------------------------------------------------------------- Anatomy  Cranium:               Appears normal         Aortic Arch:            Appears normal  Cavum:                 Appears normal         Ductal Arch:            limited views NML  Ventricles:            Appears normal         Diaphragm:              Appears normal  Choroid Plexus:        Appears normal         Stomach:                Appears normal, left                                                                        sided  Cerebellum:            Not well visualized    Abdomen:                Appears normal  Posterior Fossa:       Not well visualized    Abdominal Wall:         Appears nml (cord                                                                        insert, abd wall)  Nuchal Fold:           Not applicable (>20    Cord Vessels:           Appears normal ([redacted]                         wks GA)                                        vessel cord)  Face:                  Not well visualized    Kidneys:                 Appear normal  Lips:                  Not well visualized    Bladder:                Appears normal  Thoracic:              Appears normal         Spine:                  Appears normal  Heart:                 Appears normal         Upper Extremities:      Appears normal                         (4CH, axis, and                         situs)  RVOT:                  Appears normal         Lower Extremities:      Appears normal  LVOT:                  Appears normal  Other:  Fetus appears to be a female. RT Heel visualized. Technically difficult          due to maternal habitus and fetal position. ---------------------------------------------------------------------- Cervix Uterus Adnexa  Cervix  Not adaquately visualized  Uterus  No abnormality visualized.  Left Ovary  Not visualized.  Right Ovary  Not visualized.  Adnexa:       No abnormality visualized. ---------------------------------------------------------------------- Impression  Ms. Einar Grad [id number] at 24w 2d gestation, is admitted  with the diagnosis of preeclampsia.  We performed a fetal anatomy scan. No markers of  aneuploidies or fetal structural defects are seen. Fetal  biometry is consistent with her previously-established dates.  Amniotic fluid  is normal and good fetal activity is seen.  Cephalic presentation. Placenta is anterior and there is no  evidence of previa.  Following structures are to be evaluated on her next visit:  -Cerebellum, posterior fossa.  -Face including lips and nose.  -Profile  -Ductal arch. ---------------------------------------------------------------------- Recommendations  Follow-up scan in 1 week. ----------------------------------------------------------------------                  Noralee Space, MD Electronically Signed Final Report   01/20/2018 05:36 pm  Medications:  Scheduled . docusate sodium  100 mg Oral Daily  . labetalol  200 mg Oral TID  . prenatal multivitamin  1  tablet Oral Q1200   I have reviewed the patient's current medications.  ASSESSMENT: G5P3104 [redacted]w[redacted]d Estimated Date of Delivery: 05/07/18  Pre eclampsia, initial blood pressure  in the severe range Cephalic by sonogram yesterday  Patient Active Problem List   Diagnosis Date Noted  . Previous cesarean section complicating pregnancy, antepartum condition or complication 09/27/2012    Priority: High  . Preeclampsia 01/19/2018  . Supervision of normal pregnancy 10/27/2017  . Pyelonephritis affecting pregnancy in first trimester 10/12/2017  . History of preterm delivery, currently pregnant 04/10/2014    PLAN: S/P betamethasone x 2 S/P magnesium for CP and seizure prophylaxis , off this am Continue labetalol 200 mg, increase to TID Sonogram with normal growth and fluid Recheck labs in am    [employer] 01/21/2018,7:39 AM

## 2018-01-22 LAB — CBC WITH DIFFERENTIAL/PLATELET
Basophils Absolute: 0 10*3/uL (ref 0.0–0.1)
Basophils Relative: 0 %
EOS ABS: 0.1 10*3/uL (ref 0.0–0.7)
EOS PCT: 0 %
HCT: 32 % — ABNORMAL LOW (ref 36.0–46.0)
Hemoglobin: 10.6 g/dL — ABNORMAL LOW (ref 12.0–15.0)
LYMPHS ABS: 2 10*3/uL (ref 0.7–4.0)
Lymphocytes Relative: 17 %
MCH: 24.5 pg — AB (ref 26.0–34.0)
MCHC: 33.1 g/dL (ref 30.0–36.0)
MCV: 74.1 fL — ABNORMAL LOW (ref 78.0–100.0)
MONO ABS: 1.2 10*3/uL — AB (ref 0.1–1.0)
Monocytes Relative: 10 %
Neutro Abs: 8.8 10*3/uL — ABNORMAL HIGH (ref 1.7–7.7)
Neutrophils Relative %: 73 %
Platelets: 316 10*3/uL (ref 150–400)
RBC: 4.32 MIL/uL (ref 3.87–5.11)
RDW: 18.9 % — AB (ref 11.5–15.5)
WBC: 12.1 10*3/uL — ABNORMAL HIGH (ref 4.0–10.5)

## 2018-01-22 LAB — COMPREHENSIVE METABOLIC PANEL
ALK PHOS: 69 U/L (ref 38–126)
ALT: 11 U/L (ref 0–44)
AST: 15 U/L (ref 15–41)
Albumin: 2.9 g/dL — ABNORMAL LOW (ref 3.5–5.0)
Anion gap: 8 (ref 5–15)
BUN: 12 mg/dL (ref 6–20)
CALCIUM: 8.3 mg/dL — AB (ref 8.9–10.3)
CO2: 17 mmol/L — AB (ref 22–32)
Chloride: 109 mmol/L (ref 98–111)
Creatinine, Ser: 0.52 mg/dL (ref 0.44–1.00)
GFR calc non Af Amer: >60 mL/min (ref >60)
Glucose, Bld: 82 mg/dL (ref 70–99)
Potassium: 4.4 mmol/L (ref 3.5–5.1)
SODIUM: 134 mmol/L — AB (ref 135–145)
Total Bilirubin: 0.5 mg/dL (ref 0.3–1.2)
Total Protein: 6.6 g/dL (ref 6.5–8.1)

## 2018-01-22 MED ORDER — LABETALOL HCL 200 MG PO TABS: 600.0000 mg | ORAL_TABLET | Freq: Three times a day (TID) | ORAL | Status: DC

## 2018-01-22 MED ORDER — LABETALOL HCL 200 MG PO TABS
600.0000 mg | ORAL_TABLET | Freq: Three times a day (TID) | ORAL | Status: DC
  Administered 2018-01-22 – 2018-01-23 (×4): 600 mg via ORAL
  Filled 2018-01-22 (×4): qty 3

## 2018-01-22 NOTE — Progress Notes (Signed)
BP 163/86, HR 60. Pt asymptomatic.  600 mg labetalol PO given @ 0613, will recheck BP in an hour per Dr. Macon LargeAnyanwu.

## 2018-01-23 ENCOUNTER — Encounter (HOSPITAL_COMMUNITY): Payer: Self-pay

## 2018-01-23 ENCOUNTER — Inpatient Hospital Stay (HOSPITAL_COMMUNITY): Payer: BLUE CROSS/BLUE SHIELD | Admitting: Anesthesiology

## 2018-01-23 ENCOUNTER — Encounter (HOSPITAL_COMMUNITY)
Admission: AD | Disposition: A | Discharge status: Home / Self Care | Payer: Self-pay | Source: Ambulatory Visit | Attending: Obstetrics & Gynecology

## 2018-01-23 DIAGNOSIS — O1414 Severe pre-eclampsia complicating childbirth: Secondary | ICD-10-CM

## 2018-01-23 DIAGNOSIS — Z3A25 25 weeks gestation of pregnancy: Secondary | ICD-10-CM

## 2018-01-23 LAB — COMPREHENSIVE METABOLIC PANEL
ALT: 14 U/L (ref 0–44)
ANION GAP: 10 (ref 5–15)
AST: 20 U/L (ref 15–41)
Albumin: 3.2 g/dL — ABNORMAL LOW (ref 3.5–5.0)
Alkaline Phosphatase: 84 U/L (ref 38–126)
BUN: 13 mg/dL (ref 6–20)
CALCIUM: 9.6 mg/dL (ref 8.9–10.3)
CO2: 19 mmol/L — AB (ref 22–32)
Chloride: 104 mmol/L (ref 98–111)
Creatinine, Ser: 0.58 mg/dL (ref 0.44–1.00)
GFR calc Af Amer: >60 mL/min (ref >60)
GFR calc non Af Amer: >60 mL/min (ref >60)
GLUCOSE: 79 mg/dL (ref 70–99)
POTASSIUM: 4.5 mmol/L (ref 3.5–5.1)
Sodium: 133 mmol/L — ABNORMAL LOW (ref 135–145)
Total Bilirubin: 0.3 mg/dL (ref 0.3–1.2)
Total Protein: 7 g/dL (ref 6.5–8.1)

## 2018-01-23 LAB — CBC
HEMATOCRIT: 34.4 % — AB (ref 36.0–46.0)
Hemoglobin: 11.3 g/dL — ABNORMAL LOW (ref 12.0–15.0)
MCH: 24.5 pg — ABNORMAL LOW (ref 26.0–34.0)
MCHC: 32.8 g/dL (ref 30.0–36.0)
MCV: 74.6 fL — AB (ref 78.0–100.0)
Platelets: 362 10*3/uL (ref 150–400)
RBC: 4.61 MIL/uL (ref 3.87–5.11)
RDW: 19 % — AB (ref 11.5–15.5)
WBC: 11.7 10*3/uL — ABNORMAL HIGH (ref 4.0–10.5)

## 2018-01-23 LAB — TYPE AND SCREEN
ABO/RH(D): B POS
Antibody Screen: NEGATIVE

## 2018-01-23 SURGERY — Surgical Case
Anesthesia: Epidural | Site: Abdomen | Wound class: Clean Contaminated

## 2018-01-23 MED ORDER — FENTANYL 2.5 MCG/ML BUPIVACAINE 1/10 % EPIDURAL INFUSION (WH - ANES): 14.0000 mL/h | INTRAMUSCULAR | Status: DC | PRN

## 2018-01-23 MED ORDER — SIMETHICONE 80 MG PO CHEW
80.0000 mg | CHEWABLE_TABLET | ORAL | Status: DC | PRN
Start: 2018-01-23 — End: 2018-01-25

## 2018-01-23 MED ORDER — CYCLOBENZAPRINE HCL 5 MG PO TABS
5.0000 mg | ORAL_TABLET | Freq: Once | ORAL | Status: AC
  Administered 2018-01-23: 5 mg via ORAL
  Filled 2018-01-23: qty 1

## 2018-01-23 MED ORDER — PHENYLEPHRINE 40 MCG/ML (10ML) SYRINGE FOR IV PUSH (FOR BLOOD PRESSURE SUPPORT): 80.0000 ug | PREFILLED_SYRINGE | INTRAVENOUS | Status: DC | PRN

## 2018-01-23 MED ORDER — ONDANSETRON HCL 4 MG/2ML IJ SOLN: 4.0000 mg | Freq: Four times a day (QID) | INTRAMUSCULAR | Status: DC | PRN

## 2018-01-23 MED ORDER — FENTANYL CITRATE (PF) 100 MCG/2ML IJ SOLN
INTRAMUSCULAR | Status: AC
  Filled 2018-01-23: qty 2

## 2018-01-23 MED ORDER — LACTATED RINGERS IV SOLN
INTRAVENOUS | Status: DC
  Administered 2018-01-24: 08:00:00 via INTRAVENOUS

## 2018-01-23 MED ORDER — NIFEDIPINE ER OSMOTIC RELEASE 30 MG PO TB24
30.0000 mg | ORAL_TABLET | Freq: Two times a day (BID) | ORAL | Status: DC
  Administered 2018-01-23: 30 mg via ORAL
  Filled 2018-01-23 (×3): qty 1

## 2018-01-23 MED ORDER — SENNOSIDES-DOCUSATE SODIUM 8.6-50 MG PO TABS
2.0000 | ORAL_TABLET | ORAL | Status: DC
  Administered 2018-01-23 – 2018-01-24 (×2): 2 via ORAL
  Filled 2018-01-23 (×2): qty 2

## 2018-01-23 MED ORDER — OXYTOCIN 40 UNITS IN LACTATED RINGERS INFUSION - SIMPLE MED: 2.5000 [IU]/h | INTRAVENOUS | Status: DC

## 2018-01-23 MED ORDER — LABETALOL HCL 200 MG PO TABS
800.0000 mg | ORAL_TABLET | Freq: Three times a day (TID) | ORAL | Status: DC
  Administered 2018-01-23: 800 mg via ORAL
  Filled 2018-01-23: qty 4

## 2018-01-23 MED ORDER — PENICILLIN G POTASSIUM 5000000 UNITS IJ SOLR
5.0000 10*6.[IU] | Freq: Once | INTRAMUSCULAR | Status: AC
  Administered 2018-01-23: 5 10*6.[IU] via INTRAVENOUS
  Filled 2018-01-23: qty 5

## 2018-01-23 MED ORDER — SCOPOLAMINE 1 MG/3DAYS TD PT72
MEDICATED_PATCH | TRANSDERMAL | Status: AC
  Filled 2018-01-23: qty 1

## 2018-01-23 MED ORDER — CEFAZOLIN SODIUM-DEXTROSE 2-4 GM/100ML-% IV SOLN
INTRAVENOUS | Status: AC
  Filled 2018-01-23: qty 100

## 2018-01-23 MED ORDER — ACETAMINOPHEN 325 MG PO TABS: 325.0000 mg | ORAL_TABLET | ORAL | Status: DC | PRN

## 2018-01-23 MED ORDER — TERBUTALINE SULFATE 1 MG/ML IJ SOLN: 0.2500 mg | Freq: Once | INTRAMUSCULAR | Status: DC | PRN

## 2018-01-23 MED ORDER — ENOXAPARIN SODIUM 40 MG/0.4ML ~~LOC~~ SOLN
40.0000 mg | SUBCUTANEOUS | Status: DC
  Administered 2018-01-24: 40 mg via SUBCUTANEOUS
  Filled 2018-01-23: qty 0.4

## 2018-01-23 MED ORDER — ACETAMINOPHEN 325 MG PO TABS: 650.0000 mg | ORAL_TABLET | ORAL | Status: DC | PRN

## 2018-01-23 MED ORDER — PHENYLEPHRINE 40 MCG/ML (10ML) SYRINGE FOR IV PUSH (FOR BLOOD PRESSURE SUPPORT)
PREFILLED_SYRINGE | INTRAVENOUS | Status: AC
  Filled 2018-01-23: qty 10

## 2018-01-23 MED ORDER — MAGNESIUM SULFATE BOLUS VIA INFUSION
4.0000 g | Freq: Once | INTRAVENOUS | Status: AC
  Administered 2018-01-23: 4 g via INTRAVENOUS
  Filled 2018-01-23: qty 500

## 2018-01-23 MED ORDER — NIFEDIPINE 10 MG PO CAPS
10.0000 mg | ORAL_CAPSULE | ORAL | Status: DC | PRN
  Administered 2018-01-23: 10 mg via ORAL
  Administered 2018-01-23: 20 mg via ORAL
  Filled 2018-01-23: qty 2
  Filled 2018-01-23: qty 1

## 2018-01-23 MED ORDER — PENICILLIN G POT IN DEXTROSE 60000 UNIT/ML IV SOLN
3.0000 10*6.[IU] | INTRAVENOUS | Status: DC
  Filled 2018-01-23 (×3): qty 50

## 2018-01-23 MED ORDER — MENTHOL 3 MG MT LOZG: 1.0000 | LOZENGE | OROMUCOSAL | Status: DC | PRN

## 2018-01-23 MED ORDER — OXYCODONE HCL 5 MG PO TABS: 10.0000 mg | ORAL_TABLET | ORAL | Status: DC | PRN

## 2018-01-23 MED ORDER — FENTANYL CITRATE (PF) 100 MCG/2ML IJ SOLN: 25.0000 ug | INTRAMUSCULAR | Status: DC | PRN

## 2018-01-23 MED ORDER — MORPHINE SULFATE (PF) 0.5 MG/ML IJ SOLN
INTRAMUSCULAR | Status: DC | PRN
  Administered 2018-01-23: .2 mg via INTRATHECAL

## 2018-01-23 MED ORDER — PHENYLEPHRINE 8 MG IN D5W 100 ML (0.08MG/ML) PREMIX OPTIME
INJECTION | INTRAVENOUS | Status: DC | PRN
  Administered 2018-01-23: 60 ug/min via INTRAVENOUS

## 2018-01-23 MED ORDER — MORPHINE SULFATE (PF) 0.5 MG/ML IJ SOLN
INTRAMUSCULAR | Status: AC
  Filled 2018-01-23: qty 10

## 2018-01-23 MED ORDER — ONDANSETRON HCL 4 MG/2ML IJ SOLN: 4.0000 mg | Freq: Once | INTRAMUSCULAR | Status: DC | PRN

## 2018-01-23 MED ORDER — WITCH HAZEL-GLYCERIN EX PADS: 1.0000 "application " | MEDICATED_PAD | CUTANEOUS | Status: DC | PRN

## 2018-01-23 MED ORDER — MAGNESIUM SULFATE 40 G IN LACTATED RINGERS - SIMPLE
2.0000 g/h | INTRAVENOUS | Status: DC
  Administered 2018-01-23: 2 g/h via INTRAVENOUS
  Filled 2018-01-23: qty 40

## 2018-01-23 MED ORDER — EPHEDRINE 5 MG/ML INJ: 10.0000 mg | INTRAVENOUS | Status: DC | PRN

## 2018-01-23 MED ORDER — OXYTOCIN 40 UNITS IN LACTATED RINGERS INFUSION - SIMPLE MED: 2.5000 [IU]/h | INTRAVENOUS | Status: AC

## 2018-01-23 MED ORDER — ACETAMINOPHEN 325 MG PO TABS
650.0000 mg | ORAL_TABLET | Freq: Four times a day (QID) | ORAL | Status: DC | PRN
  Administered 2018-01-23: 650 mg via ORAL
  Filled 2018-01-23: qty 2

## 2018-01-23 MED ORDER — PHENYLEPHRINE HCL 10 MG/ML IJ SOLN
INTRAMUSCULAR | Status: DC | PRN
  Administered 2018-01-23 (×6): 80 ug via INTRAVENOUS

## 2018-01-23 MED ORDER — COCONUT OIL OIL: 1.0000 "application " | TOPICAL_OIL | Status: DC | PRN

## 2018-01-23 MED ORDER — SODIUM CHLORIDE 0.9 % IR SOLN
Status: DC | PRN
  Administered 2018-01-23: 1

## 2018-01-23 MED ORDER — LACTATED RINGERS IV SOLN: 500.0000 mL | Freq: Once | INTRAVENOUS | Status: DC

## 2018-01-23 MED ORDER — OXYCODONE HCL 5 MG PO TABS
ORAL_TABLET | ORAL | Status: AC
  Filled 2018-01-23: qty 1

## 2018-01-23 MED ORDER — DIBUCAINE 1 % RE OINT: 1.0000 "application " | TOPICAL_OINTMENT | RECTAL | Status: DC | PRN

## 2018-01-23 MED ORDER — TETANUS-DIPHTH-ACELL PERTUSSIS 5-2.5-18.5 LF-MCG/0.5 IM SUSP
0.5000 mL | Freq: Once | INTRAMUSCULAR | Status: AC
  Administered 2018-01-25: 0.5 mL via INTRAMUSCULAR
  Filled 2018-01-23: qty 0.5

## 2018-01-23 MED ORDER — ONDANSETRON HCL 4 MG/2ML IJ SOLN
INTRAMUSCULAR | Status: AC
  Filled 2018-01-23: qty 2

## 2018-01-23 MED ORDER — ACETAMINOPHEN 160 MG/5ML PO SOLN: 325.0000 mg | ORAL | Status: DC | PRN

## 2018-01-23 MED ORDER — BUTALBITAL-APAP-CAFFEINE 50-325-40 MG PO TABS
2.0000 | ORAL_TABLET | Freq: Once | ORAL | Status: AC
  Administered 2018-01-23: 2 via ORAL
  Filled 2018-01-23: qty 2

## 2018-01-23 MED ORDER — PRENATAL MULTIVITAMIN CH
1.0000 | ORAL_TABLET | Freq: Every day | ORAL | Status: DC
  Administered 2018-01-24 – 2018-01-25 (×2): 1 via ORAL
  Filled 2018-01-23 (×2): qty 1

## 2018-01-23 MED ORDER — MEPERIDINE HCL 25 MG/ML IJ SOLN: 6.2500 mg | INTRAMUSCULAR | Status: DC | PRN

## 2018-01-23 MED ORDER — MAGNESIUM SULFATE 40 G IN LACTATED RINGERS - SIMPLE
2.0000 g/h | INTRAVENOUS | Status: AC
  Administered 2018-01-24: 2 g/h via INTRAVENOUS
  Filled 2018-01-23: qty 500
  Filled 2018-01-23: qty 40

## 2018-01-23 MED ORDER — OXYCODONE HCL 5 MG/5ML PO SOLN: 5.0000 mg | Freq: Once | ORAL | Status: AC | PRN

## 2018-01-23 MED ORDER — LABETALOL HCL 5 MG/ML IV SOLN: 40.0000 mg | Freq: Once | INTRAVENOUS | Status: DC | PRN

## 2018-01-23 MED ORDER — OXYTOCIN BOLUS FROM INFUSION: 500.0000 mL | Freq: Once | INTRAVENOUS | Status: DC

## 2018-01-23 MED ORDER — LIDOCAINE HCL (PF) 1 % IJ SOLN: 30.0000 mL | INTRAMUSCULAR | Status: DC | PRN

## 2018-01-23 MED ORDER — OXYTOCIN 10 UNIT/ML IJ SOLN
INTRAMUSCULAR | Status: AC
  Filled 2018-01-23: qty 4

## 2018-01-23 MED ORDER — SCOPOLAMINE 1 MG/3DAYS TD PT72
MEDICATED_PATCH | TRANSDERMAL | Status: DC | PRN
  Administered 2018-01-23: 1 via TRANSDERMAL

## 2018-01-23 MED ORDER — OXYCODONE HCL 5 MG PO TABS
5.0000 mg | ORAL_TABLET | ORAL | Status: DC | PRN
  Administered 2018-01-25: 5 mg via ORAL
  Filled 2018-01-23: qty 1

## 2018-01-23 MED ORDER — OXYCODONE HCL 5 MG PO TABS
5.0000 mg | ORAL_TABLET | Freq: Once | ORAL | Status: AC | PRN
  Administered 2018-01-23: 5 mg via ORAL

## 2018-01-23 MED ORDER — SIMETHICONE 80 MG PO CHEW
80.0000 mg | CHEWABLE_TABLET | Freq: Three times a day (TID) | ORAL | Status: DC
  Administered 2018-01-24 – 2018-01-25 (×5): 80 mg via ORAL
  Filled 2018-01-23 (×5): qty 1

## 2018-01-23 MED ORDER — IBUPROFEN 600 MG PO TABS
600.0000 mg | ORAL_TABLET | Freq: Four times a day (QID) | ORAL | Status: DC
  Administered 2018-01-23 – 2018-01-25 (×7): 600 mg via ORAL
  Filled 2018-01-23 (×7): qty 1

## 2018-01-23 MED ORDER — FENTANYL CITRATE (PF) 100 MCG/2ML IJ SOLN
INTRAMUSCULAR | Status: DC | PRN
  Administered 2018-01-23: 25 ug via INTRAVENOUS

## 2018-01-23 MED ORDER — SIMETHICONE 80 MG PO CHEW
80.0000 mg | CHEWABLE_TABLET | ORAL | Status: DC
  Administered 2018-01-23 – 2018-01-24 (×2): 80 mg via ORAL
  Filled 2018-01-23 (×2): qty 1

## 2018-01-23 MED ORDER — DEXAMETHASONE SODIUM PHOSPHATE 4 MG/ML IJ SOLN
INTRAMUSCULAR | Status: DC | PRN
  Administered 2018-01-23: 4 mg via INTRAVENOUS

## 2018-01-23 MED ORDER — OXYTOCIN 40 UNITS IN LACTATED RINGERS INFUSION - SIMPLE MED
1.0000 m[IU]/min | INTRAVENOUS | Status: DC
  Administered 2018-01-23: 1 m[IU]/min via INTRAVENOUS
  Filled 2018-01-23: qty 1000

## 2018-01-23 MED ORDER — DIPHENHYDRAMINE HCL 50 MG/ML IJ SOLN: 12.5000 mg | INTRAMUSCULAR | Status: DC | PRN

## 2018-01-23 MED ORDER — OXYTOCIN 10 UNIT/ML IJ SOLN
INTRAVENOUS | Status: DC | PRN
  Administered 2018-01-23: 40 [IU] via INTRAVENOUS

## 2018-01-23 MED ORDER — CEFAZOLIN SODIUM-DEXTROSE 2-4 GM/100ML-% IV SOLN
2.0000 g | Freq: Once | INTRAVENOUS | Status: AC
  Administered 2018-01-23: 2 g via INTRAVENOUS

## 2018-01-23 MED ORDER — DEXAMETHASONE SODIUM PHOSPHATE 4 MG/ML IJ SOLN
INTRAMUSCULAR | Status: AC
  Filled 2018-01-23: qty 1

## 2018-01-23 MED ORDER — ONDANSETRON HCL 4 MG/2ML IJ SOLN
INTRAMUSCULAR | Status: DC | PRN
  Administered 2018-01-23: 4 mg via INTRAVENOUS

## 2018-01-23 MED ORDER — ZOLPIDEM TARTRATE 5 MG PO TABS: 5.0000 mg | ORAL_TABLET | Freq: Every evening | ORAL | Status: DC | PRN

## 2018-01-23 MED ORDER — SOD CITRATE-CITRIC ACID 500-334 MG/5ML PO SOLN
30.0000 mL | ORAL | Status: DC | PRN
  Administered 2018-01-23: 30 mL via ORAL
  Filled 2018-01-23: qty 15

## 2018-01-23 MED ORDER — LACTATED RINGERS IV SOLN: 500.0000 mL | INTRAVENOUS | Status: DC | PRN

## 2018-01-23 MED ORDER — DIPHENHYDRAMINE HCL 25 MG PO CAPS
25.0000 mg | ORAL_CAPSULE | Freq: Four times a day (QID) | ORAL | Status: DC | PRN
  Administered 2018-01-23: 25 mg via ORAL
  Filled 2018-01-23: qty 1

## 2018-01-23 MED ORDER — STERILE WATER FOR IRRIGATION IR SOLN
Status: DC | PRN
  Administered 2018-01-23: 1000 mL

## 2018-01-23 MED ORDER — PHENYLEPHRINE 8 MG IN D5W 100 ML (0.08MG/ML) PREMIX OPTIME
INJECTION | INTRAVENOUS | Status: AC
  Filled 2018-01-23: qty 100

## 2018-01-23 SURGICAL SUPPLY — 37 items
BENZOIN TINCTURE PRP APPL 2/3 (GAUZE/BANDAGES/DRESSINGS) IMPLANT
CHLORAPREP W/TINT 26ML (MISCELLANEOUS) ×2 IMPLANT
CLAMP CORD UMBIL (MISCELLANEOUS) ×2 IMPLANT
CLOTH BEACON ORANGE TIMEOUT ST (SAFETY) ×2 IMPLANT
DRSG OPSITE POSTOP 4X10 (GAUZE/BANDAGES/DRESSINGS) ×2 IMPLANT
ELECT REM PT RETURN 9FT ADLT (ELECTROSURGICAL) ×2
ELECTRODE REM PT RTRN 9FT ADLT (ELECTROSURGICAL) ×1 IMPLANT
GLOVE BIOGEL PI IND STRL 6.5 (GLOVE) ×1 IMPLANT
GLOVE BIOGEL PI IND STRL 7.0 (GLOVE) ×2 IMPLANT
GLOVE BIOGEL PI INDICATOR 6.5 (GLOVE) ×1
GLOVE BIOGEL PI INDICATOR 7.0 (GLOVE) ×2
GLOVE ORTHOPEDIC STR SZ6.5 (GLOVE) ×2 IMPLANT
GOWN STRL REUS W/TWL LRG LVL3 (GOWN DISPOSABLE) ×6 IMPLANT
HEMOSTAT ARISTA ABSORB 3G PWDR (MISCELLANEOUS) ×2 IMPLANT
KIT ABG SYR 3ML LUER SLIP (SYRINGE) ×2 IMPLANT
NEEDLE HYPO 22GX1.5 SAFETY (NEEDLE) ×2 IMPLANT
NEEDLE HYPO 25X1 1.5 SAFETY (NEEDLE) ×2 IMPLANT
NS IRRIG 1000ML POUR BTL (IV SOLUTION) ×2 IMPLANT
PACK C SECTION WH (CUSTOM PROCEDURE TRAY) ×2 IMPLANT
PAD ABD 8X10 STRL (GAUZE/BANDAGES/DRESSINGS) ×2 IMPLANT
PAD OB MATERNITY 4.3X12.25 (PERSONAL CARE ITEMS) ×2 IMPLANT
PENCIL SMOKE EVAC W/HOLSTER (ELECTROSURGICAL) ×2 IMPLANT
SPONGE GAUZE 4X4 12PLY (GAUZE/BANDAGES/DRESSINGS) ×4 IMPLANT
SPONGE LAP 18X18 X RAY DECT (DISPOSABLE) ×2 IMPLANT
STRIP CLOSURE SKIN 1/2X4 (GAUZE/BANDAGES/DRESSINGS) IMPLANT
SUT MON AB 4-0 PS1 27 (SUTURE) ×2 IMPLANT
SUT PDS AB 0 CTX 60 (SUTURE) ×2 IMPLANT
SUT PLAIN 2 0 (SUTURE) ×1
SUT PLAIN ABS 2-0 CT1 27XMFL (SUTURE) ×1 IMPLANT
SUT VIC AB 0 CT1 36 (SUTURE) ×4 IMPLANT
SUT VIC AB 0 CTX 36 (SUTURE) ×1
SUT VIC AB 0 CTX36XBRD ANBCTRL (SUTURE) ×1 IMPLANT
SUT VIC AB 2-0 CT1 27 (SUTURE) ×1
SUT VIC AB 2-0 CT1 TAPERPNT 27 (SUTURE) ×1 IMPLANT
SYR CONTROL 10ML LL (SYRINGE) ×2 IMPLANT
TOWEL OR 17X24 6PK STRL BLUE (TOWEL DISPOSABLE) ×2 IMPLANT
TRAY FOLEY W/BAG SLVR 14FR LF (SET/KITS/TRAYS/PACK) ×2 IMPLANT

## 2018-01-23 NOTE — Progress Notes (Signed)
Patient ID: Kim Duran, female   DOB: 1985-12-13, 32 y.o.   MRN: [id number] FACULTY PRACTICE ANTEPARTUM(COMPREHENSIVE) NOTE  Kim Duran is a 32 y.o. [id number] with Estimated Date of Delivery: 05/07/18   By  LMP, early ultrasound 4860w1d  who is admitted for pre eclampsia, severe features: BP.    Fetal presentation is cephalic. Length of Stay:  4  Days  Date of admission:01/19/2018  Subjective: Bifrontal headache started this am around 0400, has not responded to fioricet 2 tabs, coincided with BP spike, no visual changes S/p apresoline x 3 in past 24 hours for acute BP management Patient reports the fetal movement as active. Patient reports uterine contraction  activity as none. Patient reports  vaginal bleeding as none. Patient describes fluid per vagina as None.  Vitals:  Blood pressure 138/84, pulse 74, temperature 98.5 F (36.9 C), temperature source Oral, resp. rate 18, height 5\' 4"  (1.626 m), weight 254 lb (115.2 kg), last menstrual period 07/31/2017, SpO2 99 %, currently breastfeeding. Vitals:   01/23/18 0059 01/23/18 0447 01/23/18 0508 01/23/18 0630  BP: (!) 155/85 (!) 171/101 (!) 141/72 138/84  Pulse: 74 68 71 74  Resp:  18 18   Temp:  98.2 F (36.8 C) 98.5 F (36.9 C)   TempSrc:   Oral   SpO2:  99% 99%   Weight:      Height:       Physical Examination:  General appearance - alert, well appearing, and in no distress Abdomen - soft, nontender, nondistended, no masses or organomegaly Fundal Height:  size equals dates Pelvic Exam:  normal external genitalia, vulva, vagina, cervix, uterus and adnexa, examination not indicated Cervical Exam: Not evaluated.  Extremities: extremities normal, atraumatic, no cyanosis or edema with DTRs 3+ 2 beats clonus Membranes:intact  Fetal Monitoring:  Reassuring for [redacted] weeks gestation     Labs:  Results for orders placed or performed during the hospital encounter of 01/19/18 (from the past 48 hour(s))  CBC with  Differential/Platelet   Collection Time: 01/22/18  5:32 AM  Result Value Ref Range   WBC 12.1 (H) 4.0 - 10.5 K/uL   RBC 4.32 3.87 - 5.11 MIL/uL   Hemoglobin 10.6 (L) 12.0 - 15.0 g/dL   HCT 14.732.0 (L) 82.936.0 - 56.246.0 %   MCV 74.1 (L) 78.0 - 100.0 fL   MCH 24.5 (L) 26.0 - 34.0 pg   MCHC 33.1 30.0 - 36.0 g/dL   RDW 13.018.9 (H) 86.511.5 - 78.415.5 %   Platelets 316 150 - 400 K/uL   Neutrophils Relative % 73 %   Neutro Abs 8.8 (H) 1.7 - 7.7 K/uL   Lymphocytes Relative 17 %   Lymphs Abs 2.0 0.7 - 4.0 K/uL   Monocytes Relative 10 %   Monocytes Absolute 1.2 (H) 0.1 - 1.0 K/uL   Eosinophils Relative 0 %   Eosinophils Absolute 0.1 0.0 - 0.7 K/uL   Basophils Relative 0 %   Basophils Absolute 0.0 0.0 - 0.1 K/uL  Comprehensive metabolic panel   Collection Time: 01/22/18  5:32 AM  Result Value Ref Range   Sodium 134 (L) 135 - 145 mmol/L   Potassium 4.4 3.5 - 5.1 mmol/L   Chloride 109 98 - 111 mmol/L   CO2 17 (L) 22 - 32 mmol/L   Glucose, Bld 82 70 - 99 mg/dL   BUN 12 6 - 20 mg/dL   Creatinine, Ser 6.960.52 0.44 - 1.00 mg/dL   Calcium 8.3 (L) 8.9 - 10.3 mg/dL   Total Protein  6.6 6.5 - 8.1 g/dL   Albumin 2.9 (L) 3.5 - 5.0 g/dL   AST 15 15 - 41 U/L   ALT 11 0 - 44 U/L   Alkaline Phosphatase 69 38 - 126 U/L   Total Bilirubin 0.5 0.3 - 1.2 mg/dL   GFR calc non Af Amer >60 >60 mL/min   GFR calc Af Amer >60 >60 mL/min   Anion gap 8 5 - 15       Imaging Studies:      Medications:  Scheduled . docusate sodium  100 mg Oral Daily  . labetalol  800 mg Oral Q8H  . prenatal multivitamin  1 tablet Oral Q1200   I have reviewed the patient's current medications.  ASSESSMENT: [id number] [redacted]w[redacted]d Estimated Date of Delivery: 05/07/18  Pre eclampsia, severe features:BP Bifrontal headache, new onset, minimal response to fioricet Patient Active Problem List   Diagnosis Date Noted  . Previous cesarean section complicating pregnancy, antepartum condition or complication 09/27/2012    Priority: High  . Preeclampsia  01/19/2018  . Supervision of normal pregnancy 10/27/2017  . Pyelonephritis affecting pregnancy in first trimester 10/12/2017  . History of preterm delivery, currently pregnant 04/10/2014    PLAN: Continue to increase her anti hypertensive therapy, increase labetalol to 800 every 8 hours add procardia xl 30 BID as next agent if remains pregnant  Monitor for development of CNS symptoms and lab data to continue to characterize clinical status of her pre eclampsia, now developed bifrontal headache  S/P betamethasone x 2  S/P Magnesium sulfate x 32 hours  S/P sonogram which reveals normal fluid and growth  Patient and family aware she will be in house for the duration and deterioration of any of the relevant clinical parameters may result in proceeding toward delivery, hopefully IOL  Now that she has developed a headache will discuss management with team, including possible induction if headache persists and on going requirements for IV anti hypertensives  Lazaro Arms

## 2018-01-23 NOTE — Anesthesia Pain Management Evaluation Note (Signed)
  CRNA Pain Management Visit Note  Patient: Kim Duran, 32 y.o., female  "Hello I am a member of the anesthesia team at [facility]. We have an anesthesia team available at all times to provide care throughout the hospital, including epidural management and anesthesia for C-section. I don't know your plan for the delivery whether it a natural birth, water birth, IV sedation, nitrous supplementation, doula or epidural, but we want to meet your pain goals."   1.Was your pain managed to your expectations on prior hospitalizations?   Yes   2.What is your expectation for pain management during this hospitalization?     Epidural  3.How can we help you reach that goal? unsure  Record the patient's initial score and the patient's pain goal.   Pain: 4  Pain Goal: 6 [facility] wants you to be able to say your pain was always managed very well.  Cephus ShellingBURGER,[name] 01/23/2018

## 2018-01-23 NOTE — Progress Notes (Signed)
Faculty Note  In to room for several variable decels in a row.  S: Pt feeling occasional contractions.   O: BP 108/60   Pulse 86   Temp 98.7 F (37.1 C) (Oral)   Resp 16   Ht 5\' 4"  (1.626 m)   Wt 254 lb (115.2 kg)   LMP 07/31/2017 (Approximate)   SpO2 99%   BMI 43.60 kg/m   FHT: 130s bpm, minimal to variability, accels present, recurrent decels without contractions Toco: occasional contraction SVE: f/50/ballotable  A/P: Pt is 32 y.o. [id number] @ 2316w1d who is admitted for induction of labor for pre-eclampsia with severe features, for induction today with worsening BP and headache. She had elected for a TOLAC and pitocin started, however FHT became non-reassuring with several recurrent variable decels without any contractions. She continued to have decels despite repositioning and variability has decreased. I had an extensive conversation with patient and her husband regarding fetal status and concern for compromise. Reviewed that it is difficult to predict but given that she is remote from delivery and not having regular contractions and fetus is not tolerating, would not expect fetus to tolerate regular contractions. Recommended proceeding with repeat c-section, she is agreeable. Reviewed risks/benefits including infection, hemorrhage, damage to surrounding tissues and organs, possibility of classical c-section, she verbalizes understanding. She is agreeable to blood transfusion in the event of emergency. Consent signed.  Ancef 2 gm To OR  K. Therese SarahMeryl Nattie Lazenby, M.D. [facility]

## 2018-01-23 NOTE — Anesthesia Preprocedure Evaluation (Signed)
Anesthesia Evaluation  Patient identified by MRN, date of birth, ID band Patient awake    Reviewed: Allergy & Precautions, H&P , NPO status , Patient's Chart, lab work & pertinent test results, reviewed documented beta blocker date and time   Airway Mallampati: II  TM Distance: >3 FB Neck ROM: full    Dental no notable dental hx.    Pulmonary neg pulmonary ROS,    Pulmonary exam normal breath sounds clear to auscultation       Cardiovascular hypertension, Pt. on medications negative cardio ROS Normal cardiovascular exam Rhythm:regular Rate:Normal     Neuro/Psych negative neurological ROS  negative psych ROS   GI/Hepatic negative GI ROS, Neg liver ROS,   Endo/Other  negative endocrine ROS  Renal/GU negative Renal ROS  negative genitourinary   Musculoskeletal   Abdominal   Peds  Hematology negative hematology ROS (+)   Anesthesia Other Findings   Reproductive/Obstetrics (+) Pregnancy                             Anesthesia Physical Anesthesia Plan  ASA: III and emergent  Anesthesia Plan: Epidural   Post-op Pain Management:    Induction:   PONV Risk Score and Plan: Scopolamine patch - Pre-op  Airway Management Planned: Nasal Cannula  Additional Equipment:   Intra-op Plan:   Post-operative Plan:   Informed Consent: I have reviewed the patients History and Physical, chart, labs and discussed the procedure including the risks, benefits and alternatives for the proposed anesthesia with the patient or authorized representative who has indicated his/her understanding and acceptance.     Plan Discussed with: CRNA, Anesthesiologist and Surgeon  Anesthesia Plan Comments: (  )        Anesthesia Quick Evaluation

## 2018-01-23 NOTE — Anesthesia Postprocedure Evaluation (Signed)
Anesthesia Post Note  Patient: Kim Duran  Procedure(s) Performed: CESAREAN SECTION (N/A Abdomen)     Patient location during evaluation: Mother Baby Anesthesia Type: Epidural Level of consciousness: awake and alert Pain management: pain level controlled Vital Signs Assessment: post-procedure vital signs reviewed and stable Respiratory status: spontaneous breathing, nonlabored ventilation and respiratory function stable Cardiovascular status: stable Postop Assessment: no headache, no backache and epidural receding Anesthetic complications: no    Last Vitals:  Vitals:   01/23/18 1913 01/23/18 1914  BP:    Pulse: 73 71  Resp: 11 16  Temp:    SpO2: 95% 97%    Last Pain:  Vitals:   01/23/18 1838  TempSrc:   PainSc: 4    Pain Goal: Patients Stated Pain Goal: 2 (01/23/18 1838)               [name]

## 2018-01-23 NOTE — Progress Notes (Signed)
Patient BP 171/101, HR 68. Patient c/o of an anterior headache, pain 8/10.  Dr. Despina HiddenEure notified of elevated BP and headache, see new orders. RN will reassess BP in 15 min.

## 2018-01-23 NOTE — Op Note (Addendum)
Cesarean Section Operative Report  PATIENT: Kim Duran  PROCEDURE DATE: 01/23/2018  PREOPERATIVE DIAGNOSES: Intrauterine pregnancy at [redacted]w[redacted]d weeks gestation; Non-reassuring fetal status; pre-eclampsia with severe features  POSTOPERATIVE DIAGNOSES: The same; placental abruption; true knot in umbilical cord, additional products of conception, placental calcifications  PROCEDURE: Repeat Low Transverse Cesarean Section  SURGEON:   Surgeon(s) and Role:    * Conan Bowens, MD - Primary - Attending    * Nolene Ebbs, MD - OB Fellow   INDICATIONS: Kim Duran is a 32 y.o. [phone] at [redacted]w[redacted]d here for cesarean section secondary to the indications listed under preoperative diagnoses; please see preoperative note for further details.  The risks of cesarean section were discussed with the patient including but were not limited to: bleeding which may require transfusion or reoperation; infection which may require antibiotics; injury to bowel, bladder, ureters or other surrounding organs; injury to the fetus; need for additional procedures including hysterectomy in the event of a life-threatening hemorrhage; placental abnormalities wth subsequent pregnancies, incisional problems, thromboembolic phenomenon and other postoperative/anesthesia complications.   The patient concurred with the proposed plan, giving informed written consent for the procedure.    FINDINGS:  Viable female infant in cephalic presentation, 690g (1lb 8.3oz), Apgars 3 and 7.  Clear amniotic fluid.  Placenta with three vessel cord and true knot; placenta with abnormal appearance did not come intact and noted to have calcifications and appeared to have partial abruption; pieces of placenta removed after main portion delivered; also pieces with appeared like products of conception. Normal appearing uterus with bladder slightly tacked up onto lower uterine segment, very thin fundus. Normal appearing right tube and ovary, left tube and  ovary absent.  ANESTHESIA: Spinal INTRAVENOUS FLUIDS: 1060 mL ESTIMATED BLOOD LOSS: 558 mL URINE OUTPUT:  500 ml SPECIMENS: Placenta sent to pathology COMPLICATIONS: None immediate  PROCEDURE IN DETAIL:  The patient preoperatively received intravenous antibiotics and had sequential compression devices applied to her lower extremities.  She was then taken to the operating room where spinal anesthesia was administered and was found to be adequate. She was then placed in a dorsal supine position with a leftward tilt, and prepped and draped in a sterile manner.  A foley catheter was placed into her bladder and attached to constant gravity.    After an adequate timeout was performed, a Pfannenstiel skin incision was made with scalpel over her preexisting scar and carried through to the underlying layer of fascia. The fascia was incised in the midline, and this incision was extended bilaterally using the Mayo scissors.  Kocher clamps were applied to the superior aspect of the fascial incision and the underlying rectus muscles were dissected off bluntly and with Mayo scissors.  A similar process was carried out on the inferior aspect of the fascial incision. The rectus muscles were separated in the midline sharply and the peritoneum was entered sharply, was extended bluntly bilaterally and extended with electrocautery. The utero-vesical reflection was incised and a bladder flap created. Attention was turned to the lower uterine segment where a low transverse hysterotomy was made with a scalpel and extended bilaterally with bandage scissors.  The infant was successfully delivered, noted to have poor tone and the cord was clamped and cut immediately, and the infant was handed over to the awaiting neonatology team. Uterine massage was then administered, and the placenta delivered in multiple pieces, main portion of placenta delivered (maternal side appeared intact, but with multiple calcifications and appeared to  have partial abruption), then several  pieces of membranes were removed via sweep with lap and ring forceps; there were additional pieces that appeared more consistent with spongy products of conception than placenta and these were also removed with ring forced and multiple seeps with laps. The posterior part of her fundus was found to be abnormally thin, with gloved finger visible through window. After uterus was cleared of all debri,  the hysterotomy was closed with 0 Vicryl in a running locked fashion, and an imbricating layer was also placed with 0 Vicryl.  Figure-of-eight 0 Vicryl serosal stitches were placed to help with hemostasis.  The pelvis was cleared of all clot and debris. Hemostasis was confirmed on all surfaces and Arista was used to further ensure hemostasis.  The fascia was then closed using 0-PDS in a running fashion.  The subcutaneous layer was irrigated, then reapproximated with 2-0 Vicryl running suture in two layers.  The skin was closed with a 4-0 Mocryl subcuticular stitch. Honeycomb dressing and pressure dressing applied.   The patient tolerated the procedure well. Sponge, lap, instrument and needle counts were correct x 3.  She was taken to the recovery room in stable condition.   Maternal Disposition: PACU - hemodynamically stable.   Infant Disposition: NICU d/t prematurity    Signed: Frederik PearJulie P Degele, MD OB Fellow 01/23/2018 6:32 PM  Attestation of Attending Supervision of OB Fellow: Evaluation and management procedures [facility] under my supervision and collaboration.  I have reviewed the OB Fellow's note and chart, and I agree with the management and plan.  An experienced assistant was required given the standard of surgical care given the complexity of the case.  This assistant was needed for exposure, dissection, suctioning, retraction, instrument exchange, assisting with delivery with administration of fundal pressure, and for overall help during the  procedure.  Patient is 32 y.o. [id number] at 6250w1d being induced for worsening pre-eclampsia with severe features, recommended for repeat c-section given worsening fetal status (repetitive variable decels and decreasing variability). C-section complicated by significant scar tissue in subcutaneous tissue, large epigastric vessels, adhesion of fascia to rectus muscle, some adhesions of omentum to fascia. Infant delivered from low transverse hysterotomy with poor tone, cord clamped and cut and infant passed to waiting pedi staff. Placenta noted to have true knot (loose), a partial abruption, multiple calcifications and came out in pieces. Significant amount of spongy tissue cleared from uterus separate from placenta, with appearance of products of conception. Uterus cleaned thoroughly of all abnormal tissue. Placenta and tissue sent to pathology. Very thin left fundal uterine segment with glove visible through fundal window. No other abnormalities noted, right ovary and tube normal appearing, left ovary and tube surgically absent.   Once in PACU, I reviewed the findings with the patient and recommended she seek counsel from MFM before attempting any future pregnancy. She verbalized understanding of the above.   Baldemar LenisK. Meryl Kymoni Monday, M.D. [facility]  01/23/2018 10:00 PM

## 2018-01-23 NOTE — Transfer of Care (Signed)
Immediate Anesthesia Transfer of Care Note  Patient: Kim Duran  Procedure(s) Performed: CESAREAN SECTION (N/A Abdomen)  Patient Location: PACU  Anesthesia Type:Spinal  Level of Consciousness: awake and alert   Airway & Oxygen Therapy: Patient Spontanous Breathing  Post-op Assessment: Report given to RN and Post -op Vital signs reviewed and stable  Post vital signs: Reviewed and stable  Last Vitals:  Vitals Value Taken Time  BP    Temp    Pulse    Resp    SpO2      Last Pain:  Vitals:   01/23/18 1409  TempSrc:   PainSc: 5       Patients Stated Pain Goal: 2 (01/23/18 1409)  Complications: No apparent anesthesia complications

## 2018-01-23 NOTE — Progress Notes (Signed)
Patient initial BP 166/101, HR 84. Rechecked in 15 min: BP 168/96, HR 68.  Dr. Despina HiddenEure notified, ordered 10mg  IV hydralazine, recheck BP 20 min after administration.

## 2018-01-23 NOTE — Progress Notes (Signed)
To room 174 via w/c.

## 2018-01-23 NOTE — Progress Notes (Addendum)
Faculty Note  S: Pt still with headache, mildly improved but not gone. She feels fetal movement, denies other complaints.   O: BP (!) 143/86   Pulse 81   Temp 98.7 F (37.1 C) (Oral)   Resp 17   Ht 5\' 4"  (1.626 m)   Wt 254 lb (115.2 kg)   LMP 07/31/2017 (Approximate)   SpO2 99%   BMI 43.60 kg/m   FHT: 150s bpm, moderate variability, accels present, no decels Toco: irregular ctx SVE: f/50/ballotable TAUS: cephalic, anterior placenta  A/P: Pt is 32 y.o. [id number] @ 7343w1d who is admitted for induction of labor for pre-eclampsia with severe features based on elevated BP and headache. With poor control of BP on max dose of labetalol requiring multiple pushes of IV anti-hypertensives overnight. New onset headache early this am that is somewhat now improved but not gone. FHT with some variable decels this am. S/p BTMZ 7/11-12. Reviewed course with MFM and given worsening blood pressure and new onset headache, will proceed with induction of labor. She has been seen by NICU. Reviewed risks of prematurity versus risks to mother with continuing pregnancy in the setting of worsening pre-eclampsia, she verbalizes understanding of risks and is agreeable to delivery.   She has h/o 1 CS, 3 x VBAC. Reviewed risks/benefits of TOLAC versus RCS in detail. Patient counseled regarding potential vaginal delivery, chance of success, future implications, possible uterine rupture and need for urgent/emergent repeat cesarean. Counseled regarding potential need for repeat c-section for reasons unrelated to first c-section. Counseled regarding scheduled repeat cesarean including risks of bleeding, infection, damage to surrounding tissue, abnormal placentation, implications for future pregnancies. All questions answered. Patient desires TOLAC, consent signed 01/23/2018.   I also reviewed risks of c-section as above and including that at this gestational age, she may need classical c-section which would require her to have  c-section with any future pregnancies. She verbalizes understanding of this. Reviewed that fetus may not tolerate labor at this gestational age which may prompt recommendation for proceeding with repeat c-section.    Will start induction with pitocin MgSO4 4 g loading then 2 gr/hr Repeat PEC labs now Cont labetalol IV anti-hypertensives prn NICU aware   K. Therese SarahMeryl Aslee Such, M.D. [facility]

## 2018-01-23 NOTE — Progress Notes (Signed)
Spoke with Herbert SetaHeather, charge nurse on labor and delivery. Report on patient had previously been called by Salena SanerMegan Hoppenbauer, RN to [facility]. Herbert SetaHeather stated patient would be received to room 174 for induction of labor and the room is ready now.

## 2018-01-23 NOTE — Progress Notes (Signed)
FACULTY PRACTICE ANTEPARTUM(COMPREHENSIVE) NOTE  Kim Duran is a 32 y.o. [id number] with Estimated Date of Delivery: 05/07/18   By  LMP, early ultrasound [redacted]w[redacted]d  who is admitted for pre eclampsia, severe features: BP.    Fetal presentation is cephalic. Length of Stay:  3  Days  Date of admission:01/19/2018  Subjective: No headache or visual changes Patient reports the fetal movement as active. Patient reports uterine contraction  activity as none. Patient reports  vaginal bleeding as none. Patient describes fluid per vagina as None.  Vitals:  Blood pressure 138/84, pulse 74, temperature 98.5 F (36.9 C), temperature source Oral, resp. rate 18, height 5\' 4"  (1.626 m), weight 254 lb (115.2 kg), last menstrual period 07/31/2017, SpO2 99 %, currently breastfeeding. Vitals:   01/23/18 0059 01/23/18 0447 01/23/18 0508 01/23/18 0630  BP: (!) 155/85 (!) 171/101 (!) 141/72 138/84  Pulse: 74 68 71 74  Resp:  18 18   Temp:  98.2 F (36.8 C) 98.5 F (36.9 C)   TempSrc:   Oral   SpO2:  99% 99%   Weight:      Height:       Physical Examination:  General appearance - alert, well appearing, and in no distress Abdomen - soft, nontender, nondistended, no masses or organomegaly Fundal Height:  size equals dates Pelvic Exam:  normal external genitalia, vulva, vagina, cervix, uterus and adnexa, examination not indicated Cervical Exam: Not evaluated.  Extremities: extremities normal, atraumatic, no cyanosis or edema with DTRs 2+ bilaterally Membranes:intact  Fetal Monitoring:  Reassuring for [redacted] weeks gestation     Labs:  Results for orders placed or performed during the hospital encounter of 01/19/18 (from the past 48 hour(s))  CBC with Differential/Platelet   Collection Time: 01/22/18  5:32 AM  Result Value Ref Range   WBC 12.1 (H) 4.0 - 10.5 K/uL   RBC 4.32 3.87 - 5.11 MIL/uL   Hemoglobin 10.6 (L) 12.0 - 15.0 g/dL   HCT 45.4 (L) 09.8 - 11.9 %   MCV 74.1 (L) 78.0 - 100.0 fL   MCH 24.5  (L) 26.0 - 34.0 pg   MCHC 33.1 30.0 - 36.0 g/dL   RDW 14.7 (H) 82.9 - 56.2 %   Platelets 316 150 - 400 K/uL   Neutrophils Relative % 73 %   Neutro Abs 8.8 (H) 1.7 - 7.7 K/uL   Lymphocytes Relative 17 %   Lymphs Abs 2.0 0.7 - 4.0 K/uL   Monocytes Relative 10 %   Monocytes Absolute 1.2 (H) 0.1 - 1.0 K/uL   Eosinophils Relative 0 %   Eosinophils Absolute 0.1 0.0 - 0.7 K/uL   Basophils Relative 0 %   Basophils Absolute 0.0 0.0 - 0.1 K/uL  Comprehensive metabolic panel   Collection Time: 01/22/18  5:32 AM  Result Value Ref Range   Sodium 134 (L) 135 - 145 mmol/L   Potassium 4.4 3.5 - 5.1 mmol/L   Chloride 109 98 - 111 mmol/L   CO2 17 (L) 22 - 32 mmol/L   Glucose, Bld 82 70 - 99 mg/dL   BUN 12 6 - 20 mg/dL   Creatinine, Ser 1.30 0.44 - 1.00 mg/dL   Calcium 8.3 (L) 8.9 - 10.3 mg/dL   Total Protein 6.6 6.5 - 8.1 g/dL   Albumin 2.9 (L) 3.5 - 5.0 g/dL   AST 15 15 - 41 U/L   ALT 11 0 - 44 U/L   Alkaline Phosphatase 69 38 - 126 U/L   Total Bilirubin 0.5 0.3 -  1.2 mg/dL   GFR calc non Af Amer >60 >60 mL/min   GFR calc Af Amer >60 >60 mL/min   Anion gap 8 5 - 15       Imaging Studies:      Medications:  Scheduled . docusate sodium  100 mg Oral Daily  . labetalol  600 mg Oral Q8H  . prenatal multivitamin  1 tablet Oral Q1200   I have reviewed the patient's current medications.  ASSESSMENT: [id number] 3854w1d Estimated Date of Delivery: 05/07/18  Pre eclampsia, severe features:BP Patient Active Problem List   Diagnosis Date Noted  . Previous cesarean section complicating pregnancy, antepartum condition or complication 09/27/2012    Priority: High  . Preeclampsia 01/19/2018  . Supervision of normal pregnancy 10/27/2017  . Pyelonephritis affecting pregnancy in first trimester 10/12/2017  . History of preterm delivery, currently pregnant 04/10/2014    PLAN: Continue to increase her anti hypertensive therapy, when labetalol is maxed out add procardia xl 30 BID  Monitor for  development of CNS symptoms and lab data to continue to characterize clinical status of her pre eclampsia  S/P betamethasone x 2  S/P Magnesium sulfate x 32 hours  S/P sonogram which reveals normal fluid and growth  Patient and family aware she will be in house for the duration and deterioration of any of the relevant clinical parameters may result in proceeding toward delivery, hopefully IOL  Lazaro ArmsLuther H Winnifred Dufford

## 2018-01-23 NOTE — Progress Notes (Signed)
2 variables down to as low as 80 at nadir. Turned to left tilt.

## 2018-01-23 NOTE — Progress Notes (Signed)
Variables down to 70 at nadir, turned to left side.

## 2018-01-23 NOTE — Progress Notes (Signed)
RN was about to administer 10mg  hydralazine IV when current IV on pt infiltrated. RN removed IV and was able to establish new IV access on L AC. 10mg  hydralazine IV administered at this time. Will recheck BP in 20 min.

## 2018-01-24 ENCOUNTER — Encounter (HOSPITAL_COMMUNITY): Payer: Self-pay | Admitting: *Deleted

## 2018-01-24 LAB — CBC
HCT: 31.7 % — ABNORMAL LOW (ref 36.0–46.0)
Hemoglobin: 10.6 g/dL — ABNORMAL LOW (ref 12.0–15.0)
MCH: 24.9 pg — AB (ref 26.0–34.0)
MCHC: 33.4 g/dL (ref 30.0–36.0)
MCV: 74.4 fL — ABNORMAL LOW (ref 78.0–100.0)
PLATELETS: 345 10*3/uL (ref 150–400)
RBC: 4.26 MIL/uL (ref 3.87–5.11)
RDW: 18.8 % — ABNORMAL HIGH (ref 11.5–15.5)
WBC: 16.9 10*3/uL — AB (ref 4.0–10.5)

## 2018-01-24 LAB — COMPREHENSIVE METABOLIC PANEL
ALBUMIN: 2.8 g/dL — AB (ref 3.5–5.0)
ALK PHOS: 71 U/L (ref 38–126)
ALT: 15 U/L (ref 0–44)
AST: 25 U/L (ref 15–41)
Anion gap: 9 (ref 5–15)
BUN: 12 mg/dL (ref 6–20)
CALCIUM: 8 mg/dL — AB (ref 8.9–10.3)
CHLORIDE: 104 mmol/L (ref 98–111)
CO2: 20 mmol/L — AB (ref 22–32)
Creatinine, Ser: 0.62 mg/dL (ref 0.44–1.00)
GFR calc non Af Amer: >60 mL/min (ref >60)
GLUCOSE: 123 mg/dL — AB (ref 70–99)
Potassium: 4.3 mmol/L (ref 3.5–5.1)
SODIUM: 133 mmol/L — AB (ref 135–145)
Total Bilirubin: 0.5 mg/dL (ref 0.3–1.2)
Total Protein: 6.2 g/dL — ABNORMAL LOW (ref 6.5–8.1)

## 2018-01-24 LAB — RPR: RPR Ser Ql: NONREACTIVE

## 2018-01-24 MED ORDER — BUPIVACAINE IN DEXTROSE 0.75-8.25 % IT SOLN
INTRATHECAL | Status: DC | PRN
  Administered 2018-01-23: 1.4 mL via INTRATHECAL

## 2018-01-24 NOTE — Addendum Note (Signed)
Addendum  created 01/24/18 1853 by Bethena Midgetddono, Kristalynn Coddington, MD   Child order released for a procedure order, Intraprocedure Blocks edited, Intraprocedure Meds edited, Review and Sign - Ready for Procedure, Review and Sign - Signed, Sign clinical note

## 2018-01-24 NOTE — Progress Notes (Signed)
Pt c/o nausea and dizziness when she stood up on side of the bed for the first time trying to ambulate to bathroom. RN and nurse tech assisted pt back to bed and her symptoms improved. Peri care and foley care were done in bed. Will try to stand again in am.

## 2018-01-24 NOTE — Lactation Note (Signed)
This note was copied from a baby's chart. Lactation Consultation Note  Patient Name: Kim Duran DrapeBelinda Sitts ZOXWR'UToday's Date: 01/24/2018 Reason for consult: Initial assessment;Preterm <34wks;NICU baby This is mom's fifth baby.  She breastfed each baby for 3 years and just weaned her 32 year old 4 months ago. Taught mom hand expression and large amounts of colostrum expressed.  Initiated symphony pump.  Instructed to pump and hand express every 2-3 hours.  Breastfeeding consultation services and Providing Breastmilk For Your Baby In NICU booklet given.  Encouraged to call for assist prn. Maternal Data Has patient been taught Hand Expression?: Yes Does the patient have breastfeeding experience prior to this delivery?: Yes  Feeding    LATCH Score                   Interventions    Lactation Tools Discussed/Used Pump Review: Setup, frequency, and cleaning;Milk Storage Initiated by:: LM Date initiated:: 01/24/18   Consult Status Consult Status: Follow-up Date: 01/25/18 Follow-up type: In-patient    Huston FoleyMOULDEN, Zaryiah Barz S 01/24/2018, 12:14 PM

## 2018-01-24 NOTE — Progress Notes (Signed)
Subjective: Postpartum Day 1: Cesarean Delivery Patient reports incisional pain and tolerating PO.    Objective: Vital signs in last 24 hours: Temp:  [97.4 F (36.3 C)-98.7 F (37.1 C)] 97.6 F (36.4 C) (07/16 0753) Pulse Rate:  [65-103] 77 (07/16 0753) Resp:  [10-23] 18 (07/16 0753) BP: (104-168)/(55-101) 117/66 (07/16 0753) SpO2:  [93 %-99 %] 98 % (07/16 0753)  Physical Exam:  General: alert, cooperative and no distress Lochia: appropriate Uterine Fundus: firm Incision: no significant drainage DVT Evaluation: No evidence of DVT seen on physical exam.  Recent Labs    01/23/18 1242 01/24/18 0611  HGB 11.3* 10.6*  HCT 34.4* 31.7*    Assessment/Plan: Status post Cesarean section. Doing well postoperatively.  Continue current care. Mg SO4 d/c at 1700 Scheryl DarterJames Arnold 01/24/2018, 9:17 AM

## 2018-01-24 NOTE — Anesthesia Procedure Notes (Signed)
Spinal  Patient location during procedure: OR Start time: 01/23/2018 4:26 PM End time: 01/23/2018 4:30 PM Staffing Anesthesiologist: Bethena Midgetddono, Jorja Empie, MD Preanesthetic Checklist Completed: patient identified, site marked, surgical consent, pre-op evaluation, timeout performed, IV checked, risks and benefits discussed and monitors and equipment checked Spinal Block Patient position: sitting Prep: DuraPrep Patient monitoring: heart rate, cardiac monitor, continuous pulse ox and blood pressure Approach: midline Location: L3-4 Injection technique: single-shot Needle Needle type: Sprotte  Needle gauge: 24 G Needle length: 9 cm Assessment Sensory level: T4

## 2018-01-25 MED ORDER — OXYCODONE HCL 5 MG PO TABS: 5.0000 mg | ORAL_TABLET | Freq: Four times a day (QID) | ORAL | 0 refills | Status: DC | PRN

## 2018-01-25 MED ORDER — IBUPROFEN 600 MG PO TABS: 600.0000 mg | ORAL_TABLET | Freq: Four times a day (QID) | ORAL | 0 refills | Status: DC

## 2018-01-25 NOTE — Discharge Summary (Signed)
OB Discharge Summary     Patient Name: Kim Duran DOB: 1985/10/23 MRN: [id number]  Date of admission: 01/19/2018 Delivering MD: Conan Bowens   Date of discharge: 01/25/2018  Admitting diagnosis: 32 WKS, PRE-ECLAPMPSIA Intrauterine pregnancy: [redacted]w[redacted]d     Secondary diagnosis:  Active Problems:   Previous cesarean section complicating pregnancy, antepartum condition or complication   History of preterm delivery, currently pregnant   Pyelonephritis affecting pregnancy in first trimester   Supervision of normal pregnancy   Preeclampsia  Additional problems: none     Discharge diagnosis: Preterm Pregnancy Delivered and Preeclampsia (severe)                                                                                                Post partum procedures:none  Augmentation: n/a  Complications: None  Hospital course:  Sceduled C/S   32 y.o. yo [id number] at [redacted]w[redacted]d was admitted to the hospital 01/19/2018 for scheduled cesarean section with the following indication:Elective Repeat, Non-Reassuring FHR and preeclampsia.  Membrane Rupture Time/Date: 4:54 PM ,01/23/2018   Patient delivered a Viable infant.01/23/2018  Details of operation can be found in separate operative note.  Pateint had an uncomplicated postpartum course.  She is ambulating, tolerating a regular diet, passing flatus, and urinating well. Patient is discharged home in stable condition on  01/25/18         Physical exam  Vitals:   01/24/18 2342 01/25/18 0347 01/25/18 0737 01/25/18 1143  BP: 128/76 117/84 128/75 123/77  Pulse: 82 85 73 94  Resp: 18 18 18 18   Temp: 98.3 F (36.8 C) 98 F (36.7 C) 98.7 F (37.1 C) 98.1 F (36.7 C)  TempSrc: Oral Oral Oral Oral  SpO2: 99% 99% 99% 100%  Weight:      Height:       General: alert, cooperative and no distress Lochia: appropriate Uterine Fundus: firm Incision: Healing well with no significant drainage, Dressing is clean, dry, and intact DVT Evaluation: No  evidence of DVT seen on physical exam. Labs: Lab Results  Component Value Date   WBC 16.9 (H) 01/24/2018   HGB 10.6 (L) 01/24/2018   HCT 31.7 (L) 01/24/2018   MCV 74.4 (L) 01/24/2018   PLT 345 01/24/2018   CMP Latest Ref Rng & Units 01/24/2018  Glucose 70 - 99 mg/dL 962(X)  BUN 6 - 20 mg/dL 12  Creatinine 5.28 - 4.13 mg/dL 2.44  Sodium 010 - 272 mmol/L 133(L)  Potassium 3.5 - 5.1 mmol/L 4.3  Chloride 98 - 111 mmol/L 104  CO2 22 - 32 mmol/L 20(L)  Calcium 8.9 - 10.3 mg/dL 8.0(L)  Total Protein 6.5 - 8.1 g/dL 6.2(L)  Total Bilirubin 0.3 - 1.2 mg/dL 0.5  Alkaline Phos 38 - 126 U/L 71  AST 15 - 41 U/L 25  ALT 0 - 44 U/L 15    Discharge instruction: per After Visit Summary and "Baby and Me Booklet".  After visit meds:  Allergies as of 01/25/2018   No Known Allergies     Medication List    STOP taking these medications   acetaminophen 500 MG tablet  Commonly known as:  TYLENOL   cephALEXin 500 MG capsule Commonly known as:  KEFLEX     TAKE these medications   ibuprofen 600 MG tablet Commonly known as:  ADVIL,MOTRIN Take 1 tablet (600 mg total) by mouth every 6 (six) hours.   oxyCODONE 5 MG immediate release tablet Commonly known as:  Oxy IR/ROXICODONE Take 1-2 tablets (5-10 mg total) by mouth every 6 (six) hours as needed (pain scale 4-7).   PRENATAL VITAMIN PO Take by mouth daily.       Diet: routine diet  Activity: Advance as tolerated. Pelvic rest for 6 weeks.   Outpatient follow up:1 week Follow up Appt: Future Appointments  Date Time Provider Department Center  01/31/2018  3:15 PM Lazaro ArmsEure, Luther H, MD [facility]  02/28/2018  3:15 PM Cresenzo-Dishmon, Scarlette CalicoFrances, CNM [facility]   Follow up Visit:No follow-ups on file.  Postpartum contraception: Not Discussed  Newborn Data: Live born female  Birth Weight: 1 lb 8.3 oz (690 g) APGAR: 3, 7  Newborn Delivery   Birth date/time:  01/23/2018 16:55:00 Delivery type:  C-Section, Low  Transverse Trial of labor:  Yes C-section categorization:  Repeat     Baby Feeding: Breast Disposition:NICU   01/25/2018 Scheryl DarterJames Arnold, MD

## 2018-01-25 NOTE — Progress Notes (Signed)
Pt ambulated to  nicu    Teaching complete  

## 2018-01-25 NOTE — Discharge Instructions (Signed)
Breast Pumping Tips  If you are breastfeeding, there may be times when you cannot feed your baby directly. Returning to work or going on a trip are examples. Pumping allows you to store breast milk and feed it to your baby later.  You may not get much milk when you first start to pump. Your breasts should start to make more after a few days. If you pump at the times you usually feed your baby, you may be able to keep making enough milk to feed your baby without also using formula. The more often you pump, the more milk your body will make.  When should I pump?  · You can start to pump soon after you have your baby. Ask your doctor what is right for you and your baby.  · If you are going back to work, start pumping a few weeks before. This gives you time to learn how to pump and to store a supply of milk.  · When you are with your baby, feed your baby when he or she is hungry. Pump after each feeding.  · When you are away from your baby for many hours, pump for about 15 minutes every 2-3 hours. Pump both breasts at the same time if you can.  · If your baby has a formula feeding, make sure to pump close to the same time.  · If you drink any alcohol, wait 2 hours before pumping.  How do I get ready to pump?  Your let-down reflex is your body's natural reaction that makes your breast milk flow. It is easier to make your breast milk flow when you are relaxed. Try these things to help you relax:  · Smell one of your baby's blankets or an item of clothing.  · Look at a picture or video of your baby.  · Sit in a quiet, private space.  · Massage the breast you plan to pump.  · Place soothing warmth on the breast.  · Play relaxing music.    What are some breast pumping tips?  · Wash your hands before you pump. You do not need to wash your nipples or breasts.  · There are three ways to pump. You can:  ? Use your hand to massage and squeeze your breast.  ? Use a handheld manual pump.  ? Use an electric pump.  · Make sure the  suction cup on the breast pump is the right size. Place the suction cup directly over the nipple. It can be painful or hurt your nipple if it is the wrong size or placed wrong.  · Put a small amount of purified or modified lanolin on your nipple and areola if you are sore.  · If you are using an electric pump, change the speed and suction power to be more comfortable.  · You may need a different type of pump if pumping hurts or you do not get a lot of milk. Your doctor can help you pick what type of pump to use.  · Keep a full water bottle near you always. Drinking lots of fluid helps you make more milk.  · You can store your milk to use later. Pumped breast milk can be stored in a sealable, sterile container or plastic bag. Always put the date you pumped it on the container.  ? Milk can stay out at room temperature for up to 8 hours.  ? You can store your milk in the refrigerator for up to   8 days.  ? You can store your milk in the freezer for 3 months. Thaw frozen milk using warm water. Do not put it in the microwave.  · Do not smoke. Ask your doctor for help.  When should I call my doctor?  · You have a hard time pumping.  · You are worried you do not make enough milk.  · You have nipple pain, soreness, or redness.  · You want to take birth control pills.  This information is not intended to replace advice given to you by your health care provider. Make sure you discuss any questions you have with your health care provider.  Document Released: 12/15/2007 Document Revised: 12/04/2015 Document Reviewed: 04/20/2013  Elsevier Interactive Patient Education © 2017 [employer]

## 2018-01-27 ENCOUNTER — Observation Stay (HOSPITAL_COMMUNITY)
Admission: AD | Admit: 2018-01-27 | Discharge: 2018-01-27 | Disposition: A | Discharge status: Home / Self Care | Payer: BLUE CROSS/BLUE SHIELD | Source: Ambulatory Visit | Attending: Obstetrics & Gynecology | Admitting: Obstetrics & Gynecology

## 2018-01-27 ENCOUNTER — Encounter (HOSPITAL_COMMUNITY): Payer: Self-pay

## 2018-01-27 ENCOUNTER — Other Ambulatory Visit: Payer: Self-pay

## 2018-01-27 DIAGNOSIS — O1415 Severe pre-eclampsia, complicating the puerperium: Secondary | ICD-10-CM | POA: Diagnosis present

## 2018-01-27 DIAGNOSIS — R102 Pelvic and perineal pain: Secondary | ICD-10-CM | POA: Diagnosis not present

## 2018-01-27 DIAGNOSIS — O1093 Unspecified pre-existing hypertension complicating the puerperium: Secondary | ICD-10-CM | POA: Diagnosis not present

## 2018-01-27 DIAGNOSIS — O165 Unspecified maternal hypertension, complicating the puerperium: Secondary | ICD-10-CM | POA: Diagnosis present

## 2018-01-27 LAB — CBC
HCT: 29.5 % — ABNORMAL LOW (ref 36.0–46.0)
HCT: 31.1 % — ABNORMAL LOW (ref 36.0–46.0)
Hemoglobin: 9.7 g/dL — ABNORMAL LOW (ref 12.0–15.0)
Hemoglobin: 10.3 g/dL — ABNORMAL LOW (ref 12.0–15.0)
MCH: 24.9 pg — ABNORMAL LOW (ref 26.0–34.0)
MCH: 25.2 pg — ABNORMAL LOW (ref 26.0–34.0)
MCHC: 32.9 g/dL (ref 30.0–36.0)
MCHC: 33.1 g/dL (ref 30.0–36.0)
MCV: 75.8 fL — ABNORMAL LOW (ref 78.0–100.0)
MCV: 76 fL — ABNORMAL LOW (ref 78.0–100.0)
PLATELETS: 312 10*3/uL (ref 150–400)
PLATELETS: 320 10*3/uL (ref 150–400)
RBC: 3.89 MIL/uL (ref 3.87–5.11)
RBC: 4.09 MIL/uL (ref 3.87–5.11)
RDW: 19.7 % — AB (ref 11.5–15.5)
RDW: 19 % — ABNORMAL HIGH (ref 11.5–15.5)
WBC: 11.6 10*3/uL — AB (ref 4.0–10.5)
WBC: 11.9 10*3/uL — AB (ref 4.0–10.5)

## 2018-01-27 LAB — COMPREHENSIVE METABOLIC PANEL
ALBUMIN: 3.3 g/dL — AB (ref 3.5–5.0)
ALK PHOS: 72 U/L (ref 38–126)
ALT: 53 U/L — ABNORMAL HIGH (ref 0–44)
ALT: 54 U/L — AB (ref 0–44)
AST: 63 U/L — AB (ref 15–41)
AST: 54 U/L — ABNORMAL HIGH (ref 15–41)
Albumin: 3.3 g/dL — ABNORMAL LOW (ref 3.5–5.0)
Alkaline Phosphatase: 75 U/L (ref 38–126)
Anion gap: 10 (ref 5–15)
Anion gap: 11 (ref 5–15)
BILIRUBIN TOTAL: 0.2 mg/dL — AB (ref 0.3–1.2)
BUN: 15 mg/dL (ref 6–20)
BUN: 13 mg/dL (ref 6–20)
CHLORIDE: 104 mmol/L (ref 98–111)
CO2: 23 mmol/L (ref 22–32)
CO2: 23 mmol/L (ref 22–32)
CREATININE: 0.65 mg/dL (ref 0.44–1.00)
CREATININE: 0.57 mg/dL (ref 0.44–1.00)
Calcium: 8.7 mg/dL — ABNORMAL LOW (ref 8.9–10.3)
Calcium: 9.2 mg/dL (ref 8.9–10.3)
Chloride: 105 mmol/L (ref 98–111)
GFR calc Af Amer: >60 mL/min (ref >60)
GLUCOSE: 111 mg/dL — AB (ref 70–99)
Glucose, Bld: 96 mg/dL (ref 70–99)
Potassium: 4.5 mmol/L (ref 3.5–5.1)
Potassium: 4.2 mmol/L (ref 3.5–5.1)
Sodium: 138 mmol/L (ref 135–145)
Sodium: 138 mmol/L (ref 135–145)
TOTAL PROTEIN: 7.1 g/dL (ref 6.5–8.1)
Total Bilirubin: 0.4 mg/dL (ref 0.3–1.2)
Total Protein: 7.6 g/dL (ref 6.5–8.1)

## 2018-01-27 LAB — PROTEIN / CREATININE RATIO, URINE
Creatinine, Urine: 113 mg/dL
PROTEIN CREATININE RATIO: 1.42 mg/mg{creat} — AB (ref 0.00–0.15)
TOTAL PROTEIN, URINE: 160 mg/dL

## 2018-01-27 MED ORDER — NIFEDIPINE 10 MG PO CAPS
10.0000 mg | ORAL_CAPSULE | ORAL | Status: DC | PRN
  Administered 2018-01-27: 10 mg via ORAL
  Administered 2018-01-27: 20 mg via ORAL
  Filled 2018-01-27: qty 1
  Filled 2018-01-27: qty 2

## 2018-01-27 MED ORDER — AMLODIPINE BESYLATE 10 MG PO TABS: 10.0000 mg | ORAL_TABLET | Freq: Every day | ORAL | 1 refills | Status: DC

## 2018-01-27 MED ORDER — IBUPROFEN 600 MG PO TABS: 600.0000 mg | ORAL_TABLET | Freq: Four times a day (QID) | ORAL | Status: DC | PRN

## 2018-01-27 MED ORDER — SIMETHICONE 80 MG PO CHEW: 80.0000 mg | CHEWABLE_TABLET | Freq: Four times a day (QID) | ORAL | Status: DC | PRN

## 2018-01-27 MED ORDER — AMLODIPINE BESYLATE 5 MG PO TABS
5.0000 mg | ORAL_TABLET | Freq: Every day | ORAL | Status: DC
  Administered 2018-01-27: 5 mg via ORAL
  Filled 2018-01-27: qty 1

## 2018-01-27 MED ORDER — SENNOSIDES-DOCUSATE SODIUM 8.6-50 MG PO TABS
1.0000 | ORAL_TABLET | Freq: Every evening | ORAL | Status: DC | PRN
  Filled 2018-01-27: qty 1

## 2018-01-27 MED ORDER — OXYCODONE-ACETAMINOPHEN 5-325 MG PO TABS: 1.0000 | ORAL_TABLET | ORAL | Status: DC | PRN

## 2018-01-27 MED ORDER — LABETALOL HCL 5 MG/ML IV SOLN: 40.0000 mg | Freq: Once | INTRAVENOUS | Status: DC | PRN

## 2018-01-27 MED ORDER — PRENATAL MULTIVITAMIN CH
1.0000 | ORAL_TABLET | Freq: Every day | ORAL | Status: DC
  Administered 2018-01-27: 1 via ORAL
  Filled 2018-01-27 (×2): qty 1

## 2018-01-27 NOTE — Discharge Summary (Signed)
Physician Discharge Summary  Patient ID: Kim Duran MRN: [id number] DOB/AGE: 07/27/1985 31 y.o.  Admit date: 01/27/2018 Discharge date: 01/27/2018  Admission Diagnoses:postpartum hypertension  Discharge Diagnoses:  Active Problems:   Hypertension in pregnancy, preeclampsia, severe, delivered/postpartum   Postpartum hypertension   Discharged Condition: good  Hospital Course: Chief Complaint:  Hypertension and Postpartum Complications   First Provider Initiated Contact with Patient 01/27/18 0323     HPI: Kim Duran is a 32 y.o. [id number] who presents to maternity admissions reporting elevated blood pressure at home tonight. Denies headache or visual changes.  Was delivered by C/S on 7/15 for preeclampsia and NRFHR.  DId get magnesium sulfate with delivery and postpartum.  Just discharged 2 days ago.  Blood pressures were 117-128/75-84 on discharge.  LFTs were normal.  Protein/Creatinine ratio was 1.25 on 01/19/18.   Was not sent home on antihypertensive med due to having normal BPs.  . She reports vaginal bleeding, but no vaginal itching/burning, urinary symptoms, h/a, dizziness, n/v, or fever/chills.    Hypertension  This is a recurrent problem. The current episode started today. The problem has been gradually worsening since onset. Associated symptoms include peripheral edema (mild). Pertinent negatives include no anxiety, blurred vision, chest pain or headaches. There are no associated agents to hypertension. There are no compliance problems.    RN Note: States she took her BP at home and was getting Bps ranging from 136-182/82-112. No HA/Visual disturbances/epigastric pain. Csection on 01/23/18 for pre-eclampsia and nonreassuring FHR. Was on Mag prior to delivery and after delivery    Past Medical History:     Past Medical History:  Diagnosis Date  . Medical history non-contributory     Past obstetric history:                 OB History  Gravida Para  Term Preterm AB Living  5 5 3 2   5   SAB TAB Ectopic Multiple Live Births           0 5       # Outcome Date GA Lbr Len/2nd Weight Sex Delivery Anes PTL Lv  5 Preterm 01/23/18 [redacted]w[redacted]d  1 lb 8.3 oz (0.69 kg) M CS-LTranv Spinal  LIV  4 Term 07/30/14 [redacted]w[redacted]d 64:02 / 00:19 8 lb 5.9 oz (3.795 kg) M VBAC EPI N LIV  3 Term 10/17/12 [redacted]w[redacted]d 24:30 / 00:27 7 lb 15.5 oz (3.615 kg) M VBAC EPI N LIV     Birth Comments: No anomalies noted  2 Term 01/09/11 [redacted]w[redacted]d 10:00 7 lb 10 oz (3.459 kg) F VBAC EPI N LIV  1 Preterm 10/17/07 [redacted]w[redacted]d  4 lb (1.814 kg) F CS-LTranv Gen Y LIV     Complications: Placental abruption    Past Surgical History:      Past Surgical History:  Procedure Laterality Date  . CESAREAN SECTION    . CESAREAN SECTION N/A 01/23/2018   Procedure: CESAREAN SECTION;  Surgeon: Conan Bowens, MD;  Location: [facility] BIRTHING SUITES;  Service: Obstetrics;  Laterality: N/A;  . left ovary and tube removed  08/16/2009   10lb tumor "wrapped around ovary"     Family History:      Family History  Problem Relation Age of Onset  . Hypertension Mother   . Hypertension Father   . Cancer Paternal Aunt        breast cancer  . Cancer Paternal Aunt        breast  . Cancer Maternal Grandmother  liver  . Cancer Paternal Grandfather   . Cancer Maternal Grandfather        prostate  . Premature birth Daughter        born at 75 weeks  . Diabetes Paternal Aunt   . Cancer Other        breast and ovarian ca. relation unspecified    Social History: Social History       Tobacco Use  . Smoking status: Never Smoker  . Smokeless tobacco: Never Used  Substance Use Topics  . Alcohol use: No  . Drug use: No    Allergies: No Known Allergies  Meds:         Medications Prior to Admission  Medication Sig Dispense Refill Last Dose  . ibuprofen (ADVIL,MOTRIN) 600 MG tablet Take 1 tablet (600 mg total) by mouth every 6 (six) hours. 30 tablet 0   . oxyCODONE (OXY  IR/ROXICODONE) 5 MG immediate release tablet Take 1-2 tablets (5-10 mg total) by mouth every 6 (six) hours as needed (pain scale 4-7). 30 tablet 0   . Prenatal Vit-Fe Fumarate-FA (PRENATAL VITAMIN PO) Take by mouth daily.   01/19/2018 at Unknown time    I have reviewed patient's Past Medical Hx, Surgical Hx, Family Hx, Social Hx, medications and allergies.  ROS:  Review of Systems  Eyes: Negative for blurred vision.  Cardiovascular: Negative for chest pain.  Genitourinary: Positive for pelvic pain (postoperative) and vaginal bleeding.  Neurological: Negative for dizziness, seizures, speech difficulty, weakness, light-headedness and headaches.   Other systems negative    Physical Exam   Patient Vitals for the past 24 hrs:  BP Temp Pulse Resp Height Weight  01/27/18 0315 (!) 166/97 - 84 - - -  01/27/18 0303 (!) 182/110 98 F (36.7 C) 86 19 5\' 4"  (1.626 m) 253 lb 4 oz (114.9 kg)   After two doses of Nifedipine:        Vitals:   01/27/18 0431 01/27/18 0441 01/27/18 0451 01/27/18 0501  BP: (!) 144/83 (!) 143/81 (!) 141/82 (!) 149/93  Pulse: 99 91 95 (!) 101  Resp:      Temp:      Weight:      Height:        Constitutional: Well-developed, well-nourished female in no acute distress.  Cardiovascular: normal rate and rhythm, no ectopy audible, S1 & S2 heard, no murmur Respiratory: normal effort, no distress. Lungs CTAB with no wheezes or crackles GI: Abd soft, appropriately tender.  Incision clean and intact.  Nondistended.  No rebound, No guarding.  Bowel Sounds audible  MS: Extremities nontender, Trace-1+ edema, normal ROM Neurologic: Alert and oriented x 4.   Grossly nonfocal.  DTRs 2+, no clonus GU: Neg CVAT. Skin:  Warm and Dry Psych:  Affect appropriate.  PELVIC EXAM: deferred    Labs: --/--/B POS (07/15 1242)      Results for orders placed or performed during the hospital encounter of 01/27/18 (from the past 24 hour(s))  Protein /  creatinine ratio, urine     Status: Abnormal   Collection Time: 01/27/18  3:40 AM  Result Value Ref Range   Creatinine, Urine 113.00 mg/dL   Total Protein, Urine 160 mg/dL   Protein Creatinine Ratio 1.42 (H) 0.00 - 0.15 mg/mg[Cre]  Comprehensive metabolic panel     Status: Abnormal   Collection Time: 01/27/18  3:50 AM  Result Value Ref Range   Sodium 138 135 - 145 mmol/L   Potassium 4.5 3.5 - 5.1 mmol/L  Chloride 105 98 - 111 mmol/L   CO2 23 22 - 32 mmol/L   Glucose, Bld 111 (H) 70 - 99 mg/dL   BUN 15 6 - 20 mg/dL   Creatinine, Ser 4.090.65 0.44 - 1.00 mg/dL   Calcium 8.7 (L) 8.9 - 10.3 mg/dL   Total Protein 7.1 6.5 - 8.1 g/dL   Albumin 3.3 (L) 3.5 - 5.0 g/dL   AST 63 (H) 15 - 41 U/L   ALT 53 (H) 0 - 44 U/L   Alkaline Phosphatase 72 38 - 126 U/L   Total Bilirubin 0.2 (L) 0.3 - 1.2 mg/dL   GFR calc non Af Amer >60 >60 mL/min   GFR calc Af Amer >60 >60 mL/min   Anion gap 10 5 - 15  CBC     Status: Abnormal   Collection Time: 01/27/18  3:50 AM  Result Value Ref Range   WBC 11.6 (H) 4.0 - 10.5 K/uL   RBC 3.89 3.87 - 5.11 MIL/uL   Hemoglobin 9.7 (L) 12.0 - 15.0 g/dL   HCT 81.129.5 (L) 91.436.0 - 78.246.0 %   MCV 75.8 (L) 78.0 - 100.0 fL   MCH 24.9 (L) 26.0 - 34.0 pg   MCHC 32.9 30.0 - 36.0 g/dL   RDW 95.619.7 (H) 21.311.5 - 08.615.5 %   Platelets 312 150 - 400 K/uL    Ref. Range 01/24/2018 06:11  AST Latest Ref Range: 15 - 41 U/L 25  ALT Latest Ref Range: 0 - 44 U/L 15    Ref. Range 01/19/2018 17:54  Protein Creatinine Ratio Latest Ref Range: 0.00 - 0.15 mg/mgCre 1.25 (H)   Imaging:    MAU Course/MDM: I have ordered labs as follows:  Preeclampsia labs.  Pr/Cr ratio is increased since 7/11 and LFTs are increased since 3 days ago.  Imaging ordered: none Results reviewed.   Consult Dr Erin FullingHarraway-Smith who recommends admission for observation. Will not restart Magnesium Sulfate.  Will start Norvasc.  Will monitor BP q2hours and repeat labs this afternoon. .    Treatments in MAU included Nifedipine PO x 2 doses per preeclampsia focused order set. .     Assessment: Postoperative (Cesarean section) x 4 days Preeclampsia with exacerbation of postpartum hypertension and liver function tests  Plan: Admit to 3rd floor for observation (will need to hold in MAU a few hours due to staffing) Routine orders Start Norvasc Repeat labs this afternoon MD to follow   Wynelle BourgeoisMarie Williams CNM, MSN Certified Nurse-Midwife 01/27/2018 3:25 AM   The patient received Norvasc with a total dose of 10 mg with good BP response. Her labs were reviewed and were stable upon repeat. She felt well and requested discharge    Consults: None  Significant Diagnostic Studies: labs:  CBC    Component Value Date/Time   WBC 11.9 (H) 01/27/2018 1405   RBC 4.09 01/27/2018 1405   HGB 10.3 (L) 01/27/2018 1405   HGB 10.2 (L) 10/27/2017 1658   HGB 11.1 08/11/2012   HCT 31.1 (L) 01/27/2018 1405   HCT 31.9 (L) 10/27/2017 1658   HCT 33 08/11/2012   PLT 320 01/27/2018 1405   PLT 445 (H) 10/27/2017 1658   PLT 288 08/11/2012   MCV 76.0 (L) 01/27/2018 1405   MCV 71 (L) 10/27/2017 1658   MCH 25.2 (L) 01/27/2018 1405   MCHC 33.1 01/27/2018 1405   RDW 19.0 (H) 01/27/2018 1405   RDW 22.8 (H) 10/27/2017 1658   LYMPHSABS 2.0 01/22/2018 0532   LYMPHSABS 1.7 10/27/2017 1658  MONOABS 1.2 (H) 01/22/2018 0532   EOSABS 0.1 01/22/2018 0532   EOSABS 0.1 10/27/2017 1658   BASOSABS 0.0 01/22/2018 0532   BASOSABS 0.0 10/27/2017 1658   CMP Latest Ref Rng & Units 01/27/2018 01/27/2018 01/24/2018  Glucose 70 - 99 mg/dL 96 132(G) 401(U)  BUN 6 - 20 mg/dL 13 15 12   Creatinine 0.44 - 1.00 mg/dL 2.72 5.36 6.44  Sodium 135 - 145 mmol/L 138 138 133(L)  Potassium 3.5 - 5.1 mmol/L 4.2 4.5 4.3  Chloride 98 - 111 mmol/L 104 105 104  CO2 22 - 32 mmol/L 23 23 20(L)  Calcium 8.9 - 10.3 mg/dL 9.2 0.3(K) 7.4(Q)  Total Protein 6.5 - 8.1 g/dL 7.6 7.1 5.9(D)  Total Bilirubin 0.3 - 1.2 mg/dL 0.4  6.3(O) 0.5  Alkaline Phos 38 - 126 U/L 75 72 71  AST 15 - 41 U/L 54(H) 63(H) 25  ALT 0 - 44 U/L 54(H) 53(H) 15     Treatments: cardiac meds: labetolol and amlodipine  Discharge Exam: Blood pressure 137/83, pulse (!) 108, temperature 98.2 F (36.8 C), temperature source Oral, resp. rate 18, height 5\' 4"  (1.626 m), weight 114.9 kg (253 lb 4 oz), SpO2 99 %, unknown if currently breastfeeding. General appearance: alert, cooperative and no distress Extremities: extremities normal, atraumatic, no cyanosis or edema  Disposition: Discharge disposition: 01-Home or Self Care        Allergies as of 01/27/2018   No Known Allergies     Medication List    TAKE these medications   amLODipine 10 MG tablet Commonly known as:  NORVASC Take 1 tablet (10 mg total) by mouth daily. Start taking on:  01/28/2018   ibuprofen 600 MG tablet Commonly known as:  ADVIL,MOTRIN Take 1 tablet (600 mg total) by mouth every 6 (six) hours.   oxyCODONE 5 MG immediate release tablet Commonly known as:  Oxy IR/ROXICODONE Take 1-2 tablets (5-10 mg total) by mouth every 6 (six) hours as needed (pain scale 4-7).   PRENATAL VITAMIN PO Take by mouth daily.      Follow-up Information    Lazaro Arms, MD. Go in 4 day(s).   Specialties:  Obstetrics and Gynecology, Radiology Contact information: [address] [place] [address] [phone]           Signed: Scheryl Darter 01/27/2018, 3:57 PM

## 2018-01-27 NOTE — H&P (Signed)
Chief Complaint:  Hypertension and Postpartum Complications   First Provider Initiated Contact with Patient 01/27/18 0323     HPI: Kim Duran is a 32 y.o. [id number] who presents to maternity admissions reporting elevated blood pressure at home tonight. Denies headache or visual changes.  Was delivered by C/S on 7/15 for preeclampsia and NRFHR.  DId get magnesium sulfate with delivery and postpartum.  Just discharged 2 days ago.  Blood pressures were 117-128/75-84 on discharge.  LFTs were normal.  Protein/Creatinine ratio was 1.25 on 01/19/18.   Was not sent home on antihypertensive med due to having normal BPs.  . She reports vaginal bleeding, but no vaginal itching/burning, urinary symptoms, h/a, dizziness, n/v, or fever/chills.    Hypertension  This is a recurrent problem. The current episode started today. The problem has been gradually worsening since onset. Associated symptoms include peripheral edema (mild). Pertinent negatives include no anxiety, blurred vision, chest pain or headaches. There are no associated agents to hypertension. There are no compliance problems.    RN Note: States she took her BP at home and was getting Bps ranging from 136-182/82-112.  No HA/Visual disturbances/epigastric pain.  Csection on 01/23/18 for pre-eclampsia and nonreassuring FHR.  Was on Mag prior to delivery and after delivery    Past Medical History: Past Medical History:  Diagnosis Date  . Medical history non-contributory     Past obstetric history: OB History  Gravida Para Term Preterm AB Living  5 5 3 2   5   SAB TAB Ectopic Multiple Live Births        0 5    # Outcome Date GA Lbr Len/2nd Weight Sex Delivery Anes PTL Lv  5 Preterm 01/23/18 [redacted]w[redacted]d  1 lb 8.3 oz (0.69 kg) M CS-LTranv Spinal  LIV  4 Term 07/30/14 [redacted]w[redacted]d 64:02 / 00:19 8 lb 5.9 oz (3.795 kg) M VBAC EPI N LIV  3 Term 10/17/12 [redacted]w[redacted]d 24:30 / 00:27 7 lb 15.5 oz (3.615 kg) M VBAC EPI N LIV     Birth Comments: No anomalies noted  2  Term 01/09/11 [redacted]w[redacted]d 10:00 7 lb 10 oz (3.459 kg) F VBAC EPI N LIV  1 Preterm 10/17/07 [redacted]w[redacted]d  4 lb (1.814 kg) F CS-LTranv Gen Y LIV     Complications: Placental abruption    Past Surgical History: Past Surgical History:  Procedure Laterality Date  . CESAREAN SECTION    . CESAREAN SECTION N/A 01/23/2018   Procedure: CESAREAN SECTION;  Surgeon: Conan Bowens, MD;  Location: [facility] BIRTHING SUITES;  Service: Obstetrics;  Laterality: N/A;  . left ovary and tube removed  08/16/2009   10lb tumor "wrapped around ovary"     Family History: Family History  Problem Relation Age of Onset  . Hypertension Mother   . Hypertension Father   . Cancer Paternal Aunt        breast cancer  . Cancer Paternal Aunt        breast  . Cancer Maternal Grandmother        liver  . Cancer Paternal Grandfather   . Cancer Maternal Grandfather        prostate  . Premature birth Daughter        born at 23 weeks  . Diabetes Paternal Aunt   . Cancer Other        breast and ovarian ca. relation unspecified    Social History: Social History   Tobacco Use  . Smoking status: Never Smoker  . Smokeless tobacco: Never Used  Substance Use Topics  . Alcohol use: No  . Drug use: No    Allergies: No Known Allergies  Meds:  Medications Prior to Admission  Medication Sig Dispense Refill Last Dose  . ibuprofen (ADVIL,MOTRIN) 600 MG tablet Take 1 tablet (600 mg total) by mouth every 6 (six) hours. 30 tablet 0   . oxyCODONE (OXY IR/ROXICODONE) 5 MG immediate release tablet Take 1-2 tablets (5-10 mg total) by mouth every 6 (six) hours as needed (pain scale 4-7). 30 tablet 0   . Prenatal Vit-Fe Fumarate-FA (PRENATAL VITAMIN PO) Take by mouth daily.   01/19/2018 at Unknown time    I have reviewed patient's Past Medical Hx, Surgical Hx, Family Hx, Social Hx, medications and allergies.  ROS:  Review of Systems  Eyes: Negative for blurred vision.  Cardiovascular: Negative for chest pain.  Genitourinary: Positive for  pelvic pain (postoperative) and vaginal bleeding.  Neurological: Negative for dizziness, seizures, speech difficulty, weakness, light-headedness and headaches.   Other systems negative    Physical Exam   Patient Vitals for the past 24 hrs:  BP Temp Pulse Resp Height Weight  01/27/18 0315 (!) 166/97 - 84 - - -  01/27/18 0303 (!) 182/110 98 F (36.7 C) 86 19 5\' 4"  (1.626 m) 253 lb 4 oz (114.9 kg)   After two doses of Nifedipine:  Vitals:   01/27/18 0431 01/27/18 0441 01/27/18 0451 01/27/18 0501  BP: (!) 144/83 (!) 143/81 (!) 141/82 (!) 149/93  Pulse: 99 91 95 (!) 101  Resp:      Temp:      Weight:      Height:        Constitutional: Well-developed, well-nourished female in no acute distress.  Cardiovascular: normal rate and rhythm, no ectopy audible, S1 & S2 heard, no murmur Respiratory: normal effort, no distress. Lungs CTAB with no wheezes or crackles GI: Abd soft, appropriately tender.  Incision clean and intact.  Nondistended.  No rebound, No guarding.  Bowel Sounds audible  MS: Extremities nontender, Trace-1+ edema, normal ROM Neurologic: Alert and oriented x 4.   Grossly nonfocal.  DTRs 2+, no clonus GU: Neg CVAT. Skin:  Warm and Dry Psych:  Affect appropriate.  PELVIC EXAM: deferred    Labs: --/--/B POS (07/15 1242) Results for orders placed or performed during the hospital encounter of 01/27/18 (from the past 24 hour(s))  Protein / creatinine ratio, urine     Status: Abnormal   Collection Time: 01/27/18  3:40 AM  Result Value Ref Range   Creatinine, Urine 113.00 mg/dL   Total Protein, Urine 160 mg/dL   Protein Creatinine Ratio 1.42 (H) 0.00 - 0.15 mg/mg[Cre]  Comprehensive metabolic panel     Status: Abnormal   Collection Time: 01/27/18  3:50 AM  Result Value Ref Range   Sodium 138 135 - 145 mmol/L   Potassium 4.5 3.5 - 5.1 mmol/L   Chloride 105 98 - 111 mmol/L   CO2 23 22 - 32 mmol/L   Glucose, Bld 111 (H) 70 - 99 mg/dL   BUN 15 6 - 20 mg/dL    Creatinine, Ser 1.61 0.44 - 1.00 mg/dL   Calcium 8.7 (L) 8.9 - 10.3 mg/dL   Total Protein 7.1 6.5 - 8.1 g/dL   Albumin 3.3 (L) 3.5 - 5.0 g/dL   AST 63 (H) 15 - 41 U/L   ALT 53 (H) 0 - 44 U/L   Alkaline Phosphatase 72 38 - 126 U/L   Total Bilirubin 0.2 (L) 0.3 -  1.2 mg/dL   GFR calc non Af Amer >60 >60 mL/min   GFR calc Af Amer >60 >60 mL/min   Anion gap 10 5 - 15  CBC     Status: Abnormal   Collection Time: 01/27/18  3:50 AM  Result Value Ref Range   WBC 11.6 (H) 4.0 - 10.5 K/uL   RBC 3.89 3.87 - 5.11 MIL/uL   Hemoglobin 9.7 (L) 12.0 - 15.0 g/dL   HCT 95.629.5 (L) 21.336.0 - 08.646.0 %   MCV 75.8 (L) 78.0 - 100.0 fL   MCH 24.9 (L) 26.0 - 34.0 pg   MCHC 32.9 30.0 - 36.0 g/dL   RDW 57.819.7 (H) 46.911.5 - 62.915.5 %   Platelets 312 150 - 400 K/uL    Ref. Range 01/24/2018 06:11  AST Latest Ref Range: 15 - 41 U/L 25  ALT Latest Ref Range: 0 - 44 U/L 15    Ref. Range 01/19/2018 17:54  Protein Creatinine Ratio Latest Ref Range: 0.00 - 0.15 mg/mgCre 1.25 (H)   Imaging:    MAU Course/MDM: I have ordered labs as follows:  Preeclampsia labs.  Pr/Cr ratio is increased since 7/11 and LFTs are increased since 3 days ago.  Imaging ordered: none Results reviewed.   Consult Dr Erin FullingHarraway-Smith who recommends admission for observation. Will not restart Magnesium Sulfate.  Will start Norvasc.  Will monitor BP q2hours and repeat labs this afternoon. .   Treatments in MAU included Nifedipine PO x 2 doses per preeclampsia focused order set. .     Assessment: Postoperative (Cesarean section) x 4 days Preeclampsia with exacerbation of postpartum hypertension and liver function tests  Plan: Admit to 3rd floor for observation (will need to hold in MAU a few hours due to staffing) Routine orders Start Norvasc Repeat labs this afternoon MD to follow   Wynelle BourgeoisMarie Williams CNM, MSN Certified Nurse-Midwife 01/27/2018 3:25 AM

## 2018-01-27 NOTE — Discharge Instructions (Signed)
Preeclampsia and Eclampsia °Preeclampsia is a serious condition that develops only during pregnancy. It is also called toxemia of pregnancy. This condition causes high blood pressure along with other symptoms, such as swelling and headaches. These symptoms may develop as the condition gets worse. Preeclampsia may occur at 20 weeks of pregnancy or later. °Diagnosing and treating preeclampsia early is very important. If not treated early, it can cause serious problems for you and your baby. One problem it can lead to is eclampsia, which is a condition that causes muscle jerking or shaking (convulsions or seizures) in the mother. Delivering your baby is the best treatment for preeclampsia or eclampsia. Preeclampsia and eclampsia symptoms usually go away after your baby is born. °What are the causes? °The cause of preeclampsia is not known. °What increases the risk? °The following risk factors make you more likely to develop preeclampsia: °· Being pregnant for the first time. °· Having had preeclampsia during a past pregnancy. °· Having a family history of preeclampsia. °· Having high blood pressure. °· Being pregnant with twins or triplets. °· Being 35 or older. °· Being African-American. °· Having kidney disease or diabetes. °· Having medical conditions such as lupus or blood diseases. °· Being very overweight (obese). ° °What are the signs or symptoms? °The earliest signs of preeclampsia are: °· High blood pressure. °· Increased protein in your urine. Your health care provider will check for this at every visit before you give birth (prenatal visit). ° °Other symptoms that may develop as the condition gets worse include: °· Severe headaches. °· Sudden weight gain. °· Swelling of the hands, face, legs, and feet. °· Nausea and vomiting. °· Vision problems, such as blurred or double vision. °· Numbness in the face, arms, legs, and feet. °· Urinating less than usual. °· Dizziness. °· Slurred speech. °· Abdominal pain,  especially upper abdominal pain. °· Convulsions or seizures. ° °Symptoms generally go away after giving birth. °How is this diagnosed? °There are no screening tests for preeclampsia. Your health care provider will ask you about symptoms and check for signs of preeclampsia during your prenatal visits. You may also have tests that include: °· Urine tests. °· Blood tests. °· Checking your blood pressure. °· Monitoring your baby’s heart rate. °· Ultrasound. ° °How is this treated? °You and your health care provider will determine the treatment approach that is best for you. Treatment may include: °· Having more frequent prenatal exams to check for signs of preeclampsia, if you have an increased risk for preeclampsia. °· Bed rest. °· Reducing how much salt (sodium) you eat. °· Medicine to lower your blood pressure. °· Staying in the hospital, if your condition is severe. There, treatment will focus on controlling your blood pressure and the amount of fluids in your body (fluid retention). °· You may need to take medicine (magnesium sulfate) to prevent seizures. This medicine may be given as an injection or through an IV tube. °· Delivering your baby early, if your condition gets worse. You may have your labor started with medicine (induced), or you may have a cesarean delivery. ° °Follow these instructions at home: °Eating and drinking ° °· Drink enough fluid to keep your urine clear or pale yellow. °· Eat a healthy diet that is low in sodium. Do not add salt to your food. Check nutrition labels to see how much sodium a food or beverage contains. °· Avoid caffeine. °Lifestyle °· Do not use any products that contain nicotine or tobacco, such as cigarettes   and e-cigarettes. If you need help quitting, ask your health care provider. °· Do not use alcohol or drugs. °· Avoid stress as much as possible. Rest and get plenty of sleep. °General instructions °· Take over-the-counter and prescription medicines only as told by your  health care provider. °· When lying down, lie on your side. This keeps pressure off of your baby. °· When sitting or lying down, raise (elevate) your feet. Try putting some pillows underneath your lower legs. °· Exercise regularly. Ask your health care provider what kinds of exercise are best for you. °· Keep all follow-up and prenatal visits as told by your health care provider. This is important. °How is this prevented? °To prevent preeclampsia or eclampsia from developing during another pregnancy: °· Get proper medical care during pregnancy. Your health care provider may be able to prevent preeclampsia or diagnose and treat it early. °· Your health care provider may have you take a low-dose aspirin or a calcium supplement during your next pregnancy. °· You may have tests of your blood pressure and kidney function after giving birth. °· Maintain a healthy weight. Ask your health care provider for help managing weight gain during pregnancy. °· Work with your health care provider to manage any long-term (chronic) health conditions you have, such as diabetes or kidney problems. ° °Contact a health care provider if: °· You gain more weight than expected. °· You have headaches. °· You have nausea or vomiting. °· You have abdominal pain. °· You feel dizzy or light-headed. °Get help right away if: °· You develop sudden or severe swelling anywhere in your body. This usually happens in the legs. °· You gain 5 lbs (2.3 kg) or more during one week. °· You have severe: °? Abdominal pain. °? Headaches. °? Dizziness. °? Vision problems. °? Confusion. °? Nausea or vomiting. °· You have a seizure. °· You have trouble moving any part of your body. °· You develop numbness in any part of your body. °· You have trouble speaking. °· You have any abnormal bleeding. °· You pass out. °This information is not intended to replace advice given to you by your health care provider. Make sure you discuss any questions you have with your health  care provider. °Document Released: 06/25/2000 Document Revised: 02/24/2016 Document Reviewed: 02/02/2016 °Elsevier Interactive Patient Education © 2018 [employer] ° °

## 2018-01-27 NOTE — MAU Provider Note (Addendum)
Chief Complaint:  Hypertension and Postpartum Complications   First Provider Initiated Contact with Patient 01/27/18 0323     HPI: Kim Duran is a 32 y.o. [id number] who presents to maternity admissions reporting elevated blood pressure at home tonight. Denies headache or visual changes.  Was delivered by C/S on 7/15 for preeclampsia and NRFHR.  DId get magnesium sulfate with delivery and postpartum.  Just discharged 2 days ago.  Blood pressures were 117-128/75-84 on discharge.  LFTs were normal.  Protein/Creatinine ratio was 1.25 on 01/19/18.   Was not sent home on antihypertensive med due to having normal BPs.  . She reports vaginal bleeding, but no vaginal itching/burning, urinary symptoms, h/a, dizziness, n/v, or fever/chills.    Hypertension  This is a recurrent problem. The current episode started today. The problem has been gradually worsening since onset. Associated symptoms include peripheral edema (mild). Pertinent negatives include no anxiety, blurred vision, chest pain or headaches. There are no associated agents to hypertension. There are no compliance problems.    RN Note: States she took her BP at home and was getting Bps ranging from 136-182/82-112.  No HA/Visual disturbances/epigastric pain.  Csection on 01/23/18 for pre-eclampsia and nonreassuring FHR.  Was on Mag prior to delivery and after delivery    Past Medical History: Past Medical History:  Diagnosis Date  . Medical history non-contributory     Past obstetric history: OB History  Gravida Para Term Preterm AB Living  5 5 3 2   5  SAB TAB Ectopic Multiple Live Births        0 5    # Outcome Date GA Lbr Len/2nd Weight Sex Delivery Anes PTL Lv  5 Preterm 01/23/18 [redacted]w[redacted]d  1 lb 8.3 oz (0.69 kg) M CS-LTranv Spinal  LIV  4 Term 07/30/14 [redacted]w[redacted]d 64:02 / 00:19 8 lb 5.9 oz (3.795 kg) M VBAC EPI N LIV  3 Term 10/17/12 [redacted]w[redacted]d 24:30 / 00:27 7 lb 15.5 oz (3.615 kg) M VBAC EPI N LIV     Birth Comments: No anomalies noted  2  Term 01/09/11 [redacted]w[redacted]d 10:00 7 lb 10 oz (3.459 kg) F VBAC EPI N LIV  1 Preterm 10/17/07 [redacted]w[redacted]d  4 lb (1.814 kg) F CS-LTranv Gen Y LIV     Complications: Placental abruption    Past Surgical History: Past Surgical History:  Procedure Laterality Date  . CESAREAN SECTION    . CESAREAN SECTION N/A 01/23/2018   Procedure: CESAREAN SECTION;  Surgeon: Davis, Kelly M, MD;  Location: [facility];  Service: Obstetrics;  Laterality: N/A;  . left ovary and tube removed  08/16/2009   10lb tumor "wrapped around ovary"     Family History: Family History  Problem Relation Age of Onset  . Hypertension Mother   . Hypertension Father   . Cancer Paternal Aunt        breast cancer  . Cancer Paternal Aunt        breast  . Cancer Maternal Grandmother        liver  . Cancer Paternal Grandfather   . Cancer Maternal Grandfather        prostate  . Premature birth Daughter        born at 33 weeks  . Diabetes Paternal Aunt   . Cancer Other        breast and ovarian ca. relation unspecified    Social History: Social History   Tobacco Use  . Smoking status: Never Smoker  . Smokeless tobacco: Never Used    Substance Use Topics  . Alcohol use: No  . Drug use: No    Allergies: No Known Allergies  Meds:  Medications Prior to Admission  Medication Sig Dispense Refill Last Dose  . ibuprofen (ADVIL,MOTRIN) 600 MG tablet Take 1 tablet (600 mg total) by mouth every 6 (six) hours. 30 tablet 0   . oxyCODONE (OXY IR/ROXICODONE) 5 MG immediate release tablet Take 1-2 tablets (5-10 mg total) by mouth every 6 (six) hours as needed (pain scale 4-7). 30 tablet 0   . Prenatal Vit-Fe Fumarate-FA (PRENATAL VITAMIN PO) Take by mouth daily.   01/19/2018 at Unknown time    I have reviewed patient's Past Medical Hx, Surgical Hx, Family Hx, Social Hx, medications and allergies.  ROS:  Review of Systems  Eyes: Negative for blurred vision.  Cardiovascular: Negative for chest pain.  Genitourinary: Positive for  pelvic pain (postoperative) and vaginal bleeding.  Neurological: Negative for dizziness, seizures, speech difficulty, weakness, light-headedness and headaches.   Other systems negative    Physical Exam   Patient Vitals for the past 24 hrs:  BP Temp Pulse Resp Height Weight  01/27/18 0315 (!) 166/97 - 84 - - -  01/27/18 0303 (!) 182/110 98 F (36.7 C) 86 19 5' 4" (1.626 m) 253 lb 4 oz (114.9 kg)   After two doses of Nifedipine:  Vitals:   01/27/18 0431 01/27/18 0441 01/27/18 0451 01/27/18 0501  BP: (!) 144/83 (!) 143/81 (!) 141/82 (!) 149/93  Pulse: 99 91 95 (!) 101  Resp:      Temp:      Weight:      Height:        Constitutional: Well-developed, well-nourished female in no acute distress.  Cardiovascular: normal rate and rhythm, no ectopy audible, S1 & S2 heard, no murmur Respiratory: normal effort, no distress. Lungs CTAB with no wheezes or crackles GI: Abd soft, appropriately tender.  Incision clean and intact.  Nondistended.  No rebound, No guarding.  Bowel Sounds audible  MS: Extremities nontender, Trace-1+ edema, normal ROM Neurologic: Alert and oriented x 4.   Grossly nonfocal.  DTRs 2+, no clonus GU: Neg CVAT. Skin:  Warm and Dry Psych:  Affect appropriate.  PELVIC EXAM: deferred    Labs: --/--/B POS (07/15 1242) Results for orders placed or performed during the hospital encounter of 01/27/18 (from the past 24 hour(s))  Protein / creatinine ratio, urine     Status: Abnormal   Collection Time: 01/27/18  3:40 AM  Result Value Ref Range   Creatinine, Urine 113.00 mg/dL   Total Protein, Urine 160 mg/dL   Protein Creatinine Ratio 1.42 (H) 0.00 - 0.15 mg/mg[Cre]  Comprehensive metabolic panel     Status: Abnormal   Collection Time: 01/27/18  3:50 AM  Result Value Ref Range   Sodium 138 135 - 145 mmol/L   Potassium 4.5 3.5 - 5.1 mmol/L   Chloride 105 98 - 111 mmol/L   CO2 23 22 - 32 mmol/L   Glucose, Bld 111 (H) 70 - 99 mg/dL   BUN 15 6 - 20 mg/dL    Creatinine, Ser 0.65 0.44 - 1.00 mg/dL   Calcium 8.7 (L) 8.9 - 10.3 mg/dL   Total Protein 7.1 6.5 - 8.1 g/dL   Albumin 3.3 (L) 3.5 - 5.0 g/dL   AST 63 (H) 15 - 41 U/L   ALT 53 (H) 0 - 44 U/L   Alkaline Phosphatase 72 38 - 126 U/L   Total Bilirubin 0.2 (L) 0.3 -   1.2 mg/dL   GFR calc non Af Amer >60 >60 mL/min   GFR calc Af Amer >60 >60 mL/min   Anion gap 10 5 - 15  CBC     Status: Abnormal   Collection Time: 01/27/18  3:50 AM  Result Value Ref Range   WBC 11.6 (H) 4.0 - 10.5 K/uL   RBC 3.89 3.87 - 5.11 MIL/uL   Hemoglobin 9.7 (L) 12.0 - 15.0 g/dL   HCT 29.5 (L) 36.0 - 46.0 %   MCV 75.8 (L) 78.0 - 100.0 fL   MCH 24.9 (L) 26.0 - 34.0 pg   MCHC 32.9 30.0 - 36.0 g/dL   RDW 19.7 (H) 11.5 - 15.5 %   Platelets 312 150 - 400 K/uL    Ref. Range 01/24/2018 06:11  AST Latest Ref Range: 15 - 41 U/L 25  ALT Latest Ref Range: 0 - 44 U/L 15    Ref. Range 01/19/2018 17:54  Protein Creatinine Ratio Latest Ref Range: 0.00 - 0.15 mg/mgCre 1.25 (H)   Imaging:    MAU Course/MDM: I have ordered labs as follows:  Preeclampsia labs.  Pr/Cr ratio is increased since 7/11 and LFTs are increased since 3 days ago.  Imaging ordered: none Results reviewed.   Consult Dr Harraway-Smith who recommends admission for observation. Will not restart Magnesium Sulfate.  Will start Norvasc.  Will monitor BP q2hours and repeat labs this afternoon. .   Treatments in MAU included Nifedipine PO x 2 doses per preeclampsia focused order set. .     Assessment: Postoperative (Cesarean section) x 4 days Preeclampsia with exacerbation of postpartum hypertension and liver function tests  Plan: Admit to 3rd floor for observation (will need to hold in MAU a few hours due to staffing) Routine orders Start Norvasc Repeat labs this afternoon MD to follow   Joselin Crandell CNM, MSN Certified Nurse-Midwife 01/27/2018 3:25 AM   

## 2018-01-27 NOTE — MAU Note (Signed)
States she took her BP at home and was getting Bps ranging from 136-182/82-112.  No HA/Visual disturbances/epigastric pain.  Csection on 01/23/18 for pre-eclampsia and nonreassuring FHR.  Was on Mag prior to delivery and after delivery.

## 2018-01-27 NOTE — Progress Notes (Signed)
Patient discharged home with family. Teach Back Method Used; medications discussed; follow-up care reviewed; discharge instructions reviewed; admission discussed; prescriptions reviewed; educated about hypertension in pregnancy and handout given. PT verbalized understanding.

## 2018-01-31 ENCOUNTER — Encounter: Payer: Self-pay | Admitting: Obstetrics & Gynecology

## 2018-01-31 ENCOUNTER — Other Ambulatory Visit: Payer: Self-pay

## 2018-01-31 ENCOUNTER — Ambulatory Visit (INDEPENDENT_AMBULATORY_CARE_PROVIDER_SITE_OTHER): Payer: BLUE CROSS/BLUE SHIELD | Admitting: Obstetrics & Gynecology

## 2018-01-31 VITALS — BP 135/95 | HR 109 | Ht 64.0 in | Wt 241.0 lb

## 2018-01-31 DIAGNOSIS — Z9889 Other specified postprocedural states: Secondary | ICD-10-CM | POA: Diagnosis not present

## 2018-01-31 DIAGNOSIS — Z98891 History of uterine scar from previous surgery: Secondary | ICD-10-CM

## 2018-01-31 MED ORDER — IBUPROFEN 800 MG PO TABS: 800.0000 mg | ORAL_TABLET | Freq: Three times a day (TID) | ORAL | 0 refills | Status: DC | PRN

## 2018-01-31 NOTE — Progress Notes (Signed)
  HPI: Patient returns for routine postoperative follow-up having undergone repeat c section on 01/23/2018.  The patient's immediate postoperative recovery has been unremarkable. Since hospital discharge the patient reports no problems, feels pretty good overall.   Current Outpatient Medications: amLODipine (NORVASC) 10 MG tablet, Take 1 tablet (10 mg total) by mouth daily., Disp: 30 tablet, Rfl: 1 ibuprofen (ADVIL,MOTRIN) 600 MG tablet, Take 1 tablet (600 mg total) by mouth every 6 (six) hours., Disp: 30 tablet, Rfl: 0 oxyCODONE (OXY IR/ROXICODONE) 5 MG immediate release tablet, Take 1-2 tablets (5-10 mg total) by mouth every 6 (six) hours as needed (pain scale 4-7)., Disp: 30 tablet, Rfl: 0 Prenatal Vit-Fe Fumarate-FA (PRENATAL VITAMIN PO), Take by mouth daily., Disp: , Rfl:   No current facility-administered medications for this visit.     Blood pressure (!) 135/95, pulse (!) 109, height 5\' 4"  (1.626 m), weight 241 lb (109.3 kg), unknown if currently breastfeeding.  Physical Exam: Incision clean dry intact  Diagnostic Tests:   Pathology: Placenta appears aged  Impression: S/p repeat c section for NR FHRT due to severe pre eclampsia  Plan:   Follow up: 2  weeks  Lazaro ArmsLuther H Jodie Cavey, MD

## 2018-02-14 ENCOUNTER — Encounter: Payer: BLUE CROSS/BLUE SHIELD | Admitting: Obstetrics & Gynecology

## 2018-02-15 ENCOUNTER — Encounter: Payer: BLUE CROSS/BLUE SHIELD | Admitting: Advanced Practice Midwife

## 2018-02-28 ENCOUNTER — Ambulatory Visit: Payer: BLUE CROSS/BLUE SHIELD | Admitting: Women's Health

## 2018-03-08 ENCOUNTER — Ambulatory Visit (INDEPENDENT_AMBULATORY_CARE_PROVIDER_SITE_OTHER): Payer: BLUE CROSS/BLUE SHIELD | Admitting: Advanced Practice Midwife

## 2018-03-08 ENCOUNTER — Encounter: Payer: Self-pay | Admitting: Advanced Practice Midwife

## 2018-03-08 MED ORDER — NORETHINDRONE 0.35 MG PO TABS: 1.0000 | ORAL_TABLET | Freq: Every day | ORAL | 11 refills | Status: DC

## 2018-03-08 NOTE — Patient Instructions (Addendum)
Fenugreek three times a day.  Mother's Milk Tea, etc   If your BP is >140/90 on several occasions after stopping meds, restart them . Let me know either way. norm

## 2018-03-08 NOTE — Progress Notes (Signed)
Kim Duran is a 32 y.o. who presents for a postpartum visit. She is 5 weeks postpartum following a low cervical vertical Cesarean section d/t NRFHT. I have fully reviewed the prenatal and intrapartum course. The delivery was at 25.1 gestational weeks.d/t severe PreE.   Anesthesia: spinal Postpartum course has been complicated by readmision 2 days after DC d/t severe range BPs. DC'd on Norvasc 10mg . . Baby's course has been c omplicated by prematurity issues, having heart surgery today for PDA+.. Bleeding: started period Sunday. Bowel function is normal. Bladder function is normal. Patient is sexually active. Contraception method is none. Postpartum depression screening: negative.   Current Outpatient Medications:  .  amLODipine (NORVASC) 10 MG tablet, Take 1 tablet (10 mg total) by mouth daily., Disp: 30 tablet, Rfl: 1 .  ibuprofen (ADVIL,MOTRIN) 600 MG tablet, Take 1 tablet (600 mg total) by mouth every 6 (six) hours. (Patient not taking: Reported on 03/08/2018), Disp: 30 tablet, Rfl: 0 .  ibuprofen (ADVIL,MOTRIN) 800 MG tablet, Take 1 tablet (800 mg total) by mouth every 8 (eight) hours as needed. (Patient not taking: Reported on 03/08/2018), Disp: 30 tablet, Rfl: 0 .  oxyCODONE (OXY IR/ROXICODONE) 5 MG immediate release tablet, Take 1-2 tablets (5-10 mg total) by mouth every 6 (six) hours as needed (pain scale 4-7). (Patient not taking: Reported on 03/08/2018), Disp: 30 tablet, Rfl: 0 .  Prenatal Vit-Fe Fumarate-FA (PRENATAL VITAMIN PO), Take by mouth daily., Disp: , Rfl:   Review of Systems   Constitutional: Negative for fever and chills Eyes: Negative for visual disturbances Respiratory: Negative for shortness of breath, dyspnea Cardiovascular: Negative for chest pain or palpitations  Gastrointestinal: Negative for vomiting, diarrhea and constipation Genitourinary: Negative for dysuria and urgency Musculoskeletal: Negative for back pain, joint pain, myalgias  Neurological: Negative for  dizziness and headaches    Objective:     Vitals:   03/08/18 0915  BP: 125/89  Pulse: 82   General:  alert, cooperative and no distress   Breasts:  negative  Lungs: Normal respiratory effort  Heart:  regular rate and rhythm  Abdomen: Soft, nontender   Vulva:  normal  Vagina: normal vagina  Cervix:  closed  Corpus: Well involuted     Rectal Exam: no hemorrhoids        Assessment:    normal postpartum exam. PreE, normal BPs now.  Plan:   1. Contraception: oral progesterone-only contraceptive 2. Take BP at home.  Stop meds. If your BP is >140/90 on several occasions after stopping meds, restart them . Let me know either way. norm

## 2018-03-18 ENCOUNTER — Other Ambulatory Visit: Payer: Self-pay | Admitting: Obstetrics & Gynecology

## 2018-03-21 NOTE — Progress Notes (Signed)
Provider seen at this visit.

## 2018-12-06 ENCOUNTER — Telehealth: Payer: Self-pay | Admitting: Obstetrics & Gynecology

## 2018-12-06 NOTE — Telephone Encounter (Signed)
Pt states she woke up in pain in her vaginal area and cervix's. Requesting and app.t

## 2018-12-07 NOTE — Telephone Encounter (Signed)
No answer. Will retry later.

## 2019-10-19 ENCOUNTER — Ambulatory Visit: Payer: Medicaid Other

## 2019-10-20 ENCOUNTER — Ambulatory Visit: Payer: Medicaid Other | Attending: Internal Medicine

## 2019-10-20 DIAGNOSIS — Z23 Encounter for immunization: Secondary | ICD-10-CM

## 2019-10-20 NOTE — Progress Notes (Signed)
   Covid-19 Vaccination Clinic  Name:  Kim Duran    MRN: [id number] DOB: [birth year]  10/20/2019  Ms. Kim Duran was observed post Covid-19 immunization for 15 minutes without incident. She was provided with Vaccine Information Sheet and instruction to access the V-Safe system.   Ms. Kim Duran was instructed to call 911 with any severe reactions post vaccine: Marland Kitchen Difficulty breathing  . Swelling of face and throat  . A fast heartbeat  . A bad rash all over body  . Dizziness and weakness   Immunizations Administered    Name Date Dose VIS Date Route   Pfizer COVID-19 Vaccine 10/20/2019  9:43 AM 0.3 mL 06/22/2019 Intramuscular   Manufacturer: [employer], [employer]   Lot: [id number]   NDC: [address]-8

## 2019-11-13 ENCOUNTER — Ambulatory Visit: Payer: Medicaid Other

## 2019-11-20 ENCOUNTER — Ambulatory Visit: Payer: Medicaid Other | Attending: Internal Medicine

## 2019-11-20 DIAGNOSIS — Z23 Encounter for immunization: Secondary | ICD-10-CM

## 2019-11-20 NOTE — Progress Notes (Signed)
   Covid-19 Vaccination Clinic  Name:  Kim Duran    MRN: [id number] DOB: 08-Feb-1986  11/20/2019  Kim Duran was observed post Covid-19 immunization for 15 minutes without incident. She was provided with Vaccine Information Sheet and instruction to access the V-Safe system.   Kim Duran was instructed to call 911 with any severe reactions post vaccine: Marland Kitchen Difficulty breathing  . Swelling of face and throat  . A fast heartbeat  . A bad rash all over body  . Dizziness and weakness   Immunizations Administered    Name Date Dose VIS Date Route   Pfizer COVID-19 Vaccine 11/20/2019  4:20 PM 0.3 mL 09/05/2018 Intramuscular   Manufacturer: [employer], [employer]   Lot: [id number]   NDC: [address]-1

## 2020-03-03 ENCOUNTER — Other Ambulatory Visit (HOSPITAL_COMMUNITY)
Admission: RE | Admit: 2020-03-03 | Discharge: 2020-03-03 | Disposition: A | Discharge status: Home / Self Care | Payer: BC Managed Care – PPO | Source: Ambulatory Visit | Attending: Obstetrics & Gynecology | Admitting: Obstetrics & Gynecology

## 2020-03-03 ENCOUNTER — Encounter: Payer: Self-pay | Admitting: Women's Health

## 2020-03-03 ENCOUNTER — Ambulatory Visit (INDEPENDENT_AMBULATORY_CARE_PROVIDER_SITE_OTHER): Payer: BC Managed Care – PPO | Admitting: Women's Health

## 2020-03-03 VITALS — BP 155/99 | HR 82 | Ht 64.0 in | Wt 243.5 lb

## 2020-03-03 DIAGNOSIS — F418 Other specified anxiety disorders: Secondary | ICD-10-CM | POA: Insufficient documentation

## 2020-03-03 DIAGNOSIS — Z8751 Personal history of pre-term labor: Secondary | ICD-10-CM

## 2020-03-03 DIAGNOSIS — O34219 Maternal care for unspecified type scar from previous cesarean delivery: Secondary | ICD-10-CM | POA: Diagnosis not present

## 2020-03-03 DIAGNOSIS — Z131 Encounter for screening for diabetes mellitus: Secondary | ICD-10-CM | POA: Diagnosis not present

## 2020-03-03 DIAGNOSIS — R102 Pelvic and perineal pain unspecified side: Secondary | ICD-10-CM

## 2020-03-03 DIAGNOSIS — R5383 Other fatigue: Secondary | ICD-10-CM | POA: Diagnosis not present

## 2020-03-03 DIAGNOSIS — Z1329 Encounter for screening for other suspected endocrine disorder: Secondary | ICD-10-CM

## 2020-03-03 DIAGNOSIS — Z1321 Encounter for screening for nutritional disorder: Secondary | ICD-10-CM | POA: Diagnosis not present

## 2020-03-03 DIAGNOSIS — O10919 Unspecified pre-existing hypertension complicating pregnancy, unspecified trimester: Secondary | ICD-10-CM | POA: Insufficient documentation

## 2020-03-03 DIAGNOSIS — N644 Mastodynia: Secondary | ICD-10-CM

## 2020-03-03 DIAGNOSIS — M79622 Pain in left upper arm: Secondary | ICD-10-CM

## 2020-03-03 MED ORDER — CITALOPRAM HYDROBROMIDE 20 MG PO TABS: 20.0000 mg | ORAL_TABLET | Freq: Every day | ORAL | 6 refills | Status: DC

## 2020-03-03 MED ORDER — AMLODIPINE BESYLATE 5 MG PO TABS: 5.0000 mg | ORAL_TABLET | Freq: Every day | ORAL | 6 refills | Status: DC

## 2020-03-03 NOTE — Patient Instructions (Signed)
[employer]

## 2020-03-03 NOTE — Progress Notes (Signed)
GYN VISIT Patient name: Kim Duran MRN [id number]  Date of birth: [birth year] Chief Complaint:   pain with ovulation (requests labs)  History of Present Illness:   Kim Duran is a 34 y.o. [phone] African American female being seen today for multiple complaints.   Constant lower abdominal pain w/ ovulation 'for awhile', used to last a couple of hours, now all day. Concerned she could have fibroids, has family h/o same. Periods regular, last 4-5d, clots, bad cramps, at heaviest changes pad q 1hr. Denies abnormal discharge, itching/odor/irritation.   Pain in Lt axilla/upper breast 'for awhile', 2 maternal aunts dx w/ breast cancer in 61s.  Feels depressed/anxious, overwhelmed, very tired. Has a lot going on at home w/ kids. Denies SI/HI. Wants to get on meds.  Fatigued, wants to check blood work Depression screen [facility] 2/9 03/03/2020 10/27/2017  Decreased Interest 3 2  Down, Depressed, Hopeless 3 1  PHQ - 2 Score 6 3  Altered sleeping 2 1  Tired, decreased energy 3 2  Change in appetite 3 1  Feeling bad or failure about yourself  3 1  Trouble concentrating 3 0  Moving slowly or fidgety/restless 0 0  Suicidal thoughts 0 0  PHQ-9 Score 20 8  Difficult doing work/chores Very difficult -    Patient's last menstrual period was 02/03/2020. The current method of family planning is coitus interruptus.  Last pap 12/30/17. Results were:  normal Review of Systems:   Pertinent items are noted in HPI Denies fever/chills, dizziness, headaches, visual disturbances, fatigue, shortness of breath, chest pain, abdominal pain, vomiting, abnormal vaginal discharge/itching/odor/irritation, problems with periods, bowel movements, urination, or intercourse unless otherwise stated above.  Pertinent History Reviewed:  Reviewed past medical,surgical, social, obstetrical and family history.  Reviewed problem list, medications and allergies. Physical Assessment:   Vitals:   03/03/20 0904 03/03/20  0910  BP: (!) 157/101 (!) 155/99  Pulse: 81 82  Weight: 243 lb 8 oz (110.5 kg)   Height: 5\' 4"  (1.626 m)   Body mass index is 41.8 kg/m.       Physical Examination:   General appearance: alert, well appearing, and in no distress  Mental status: alert, oriented to person, place, and time  Skin: warm & dry   Cardiovascular: normal heart rate noted  Respiratory: normal respiratory effort, no distress  Breasts - breasts appear normal, no suspicious masses, no skin or nipple changes or axillary nodes   Abdomen: soft, non-tender   Pelvic: VULVA: normal appearing vulva with no masses, tenderness or lesions, VAGINA: normal appearing vagina with normal color and discharge, no lesions, CERVIX: normal appearing cervix without discharge or lesions, UTERUS: uterus is normal size, shape, consistency and nontender, ADNEXA: normal adnexa in size, nontender and no masses  Extremities: no edema   Chaperone:    No results found for this or any previous visit (from the past 24 hour(s)).  Assessment & Plan:  1) Pain w/ ovulation> getting worse, CV swab, will get pelvic u/s  2) Dep/anx> rx celexa 20mg , order placed for therapy w/ [facility], f/u 4wks  3) CHTN> officially dx today> rx norvasc 5mg   4) Lt axilla/breast pain> will get mammo and u/s, orders placed, note routed to Tish to schedule  5) Fatigue> will check labs  Meds:  Meds ordered this encounter  Medications  . citalopram (CELEXA) 20 MG tablet    Sig: Take 1 tablet (20 mg total) by mouth daily.    Dispense:  30 tablet  Refill:  6    Order Specific Question:   Supervising Provider    Answer:   Despina Hidden, LUTHER H [[id number]]  . amLODipine (NORVASC) 5 MG tablet    Sig: Take 1 tablet (5 mg total) by mouth daily.    Dispense:  30 tablet    Refill:  6    Order Specific Question:   Supervising Provider    Answer:   Duane Lope H [[id number]]    Orders Placed This Encounter  Procedures  . MM DIAG BREAST TOMO BILATERAL  . US BREAST LTD UNI  LEFT INC AXILLA  . CBC  . TSH  . VITAMIN D 25 Hydroxy (Vit-D Deficiency, Fractures)  . Hemoglobin A1c  . Comprehensive metabolic panel  . Amb ref to [facility]    Return for 1st available, US:GYN, in person and f/u w/ me after; then 4wks from now for online visit.  Cheral Marker CNM, [facility] 03/03/2020 1:57 PM

## 2020-03-04 ENCOUNTER — Encounter: Payer: Self-pay | Admitting: Women's Health

## 2020-03-04 ENCOUNTER — Telehealth: Payer: Self-pay | Admitting: *Deleted

## 2020-03-04 DIAGNOSIS — D649 Anemia, unspecified: Secondary | ICD-10-CM | POA: Insufficient documentation

## 2020-03-04 LAB — CERVICOVAGINAL ANCILLARY ONLY
Bacterial Vaginitis (gardnerella): NEGATIVE
Candida Glabrata: NEGATIVE
Candida Vaginitis: NEGATIVE
Chlamydia: NEGATIVE
Comment: NEGATIVE
Comment: NEGATIVE
Comment: NEGATIVE
Comment: NEGATIVE
Comment: NEGATIVE
Comment: NORMAL
Neisseria Gonorrhea: NEGATIVE
Trichomonas: NEGATIVE

## 2020-03-04 LAB — CBC
Hematocrit: 29.1 % — ABNORMAL LOW (ref 34.0–46.6)
Hemoglobin: 8 g/dL — ABNORMAL LOW (ref 11.1–15.9)
MCH: 16.7 pg — ABNORMAL LOW (ref 26.6–33.0)
MCHC: 27.5 g/dL — ABNORMAL LOW (ref 31.5–35.7)
MCV: 61 fL — ABNORMAL LOW (ref 79–97)
Platelets: 464 10*3/uL — ABNORMAL HIGH (ref 150–450)
RBC: 4.79 x10E6/uL (ref 3.77–5.28)
RDW: 23 % — ABNORMAL HIGH (ref 11.7–15.4)
WBC: 7.6 10*3/uL (ref 3.4–10.8)

## 2020-03-04 LAB — COMPREHENSIVE METABOLIC PANEL
ALT: 11 IU/L (ref 0–32)
AST: 13 IU/L (ref 0–40)
Albumin/Globulin Ratio: 1.2 (ref 1.2–2.2)
Albumin: 4.4 g/dL (ref 3.8–4.8)
Alkaline Phosphatase: 69 IU/L (ref 48–121)
BUN/Creatinine Ratio: 14 (ref 9–23)
BUN: 11 mg/dL (ref 6–20)
Bilirubin Total: 0.3 mg/dL (ref 0.0–1.2)
CO2: 19 mmol/L — ABNORMAL LOW (ref 20–29)
Calcium: 9.8 mg/dL (ref 8.7–10.2)
Chloride: 104 mmol/L (ref 96–106)
Creatinine, Ser: 0.8 mg/dL (ref 0.57–1.00)
GFR calc Af Amer: 112 mL/min/{1.73_m2} (ref >59)
GFR calc non Af Amer: 97 mL/min/{1.73_m2} (ref >59)
Globulin, Total: 3.6 g/dL (ref 1.5–4.5)
Glucose: 102 mg/dL — ABNORMAL HIGH (ref 65–99)
Potassium: 4.5 mmol/L (ref 3.5–5.2)
Sodium: 137 mmol/L (ref 134–144)
Total Protein: 8 g/dL (ref 6.0–8.5)

## 2020-03-04 LAB — VITAMIN D 25 HYDROXY (VIT D DEFICIENCY, FRACTURES): Vit D, 25-Hydroxy: 24.5 ng/mL — ABNORMAL LOW (ref 30.0–100.0)

## 2020-03-04 LAB — TSH: TSH: 1.24 u[IU]/mL (ref 0.450–4.500)

## 2020-03-04 LAB — HEMOGLOBIN A1C
Est. average glucose Bld gHb Est-mCnc: 94 mg/dL
Hgb A1c MFr Bld: 4.9 % (ref 4.8–5.6)

## 2020-03-04 NOTE — Telephone Encounter (Signed)
Husband made aware that patient is scheduled for mammogram at [facility] on 9/7 at 11:20.  No lotion, powders or deo prior to appt. Stated he would give patient information.

## 2020-03-05 ENCOUNTER — Other Ambulatory Visit (HOSPITAL_COMMUNITY): Payer: Self-pay | Admitting: Women's Health

## 2020-03-05 DIAGNOSIS — N644 Mastodynia: Secondary | ICD-10-CM

## 2020-03-10 ENCOUNTER — Other Ambulatory Visit: Payer: Self-pay | Admitting: Women's Health

## 2020-03-10 DIAGNOSIS — R102 Pelvic and perineal pain: Secondary | ICD-10-CM

## 2020-03-11 ENCOUNTER — Other Ambulatory Visit: Payer: BC Managed Care – PPO

## 2020-03-18 ENCOUNTER — Ambulatory Visit (HOSPITAL_COMMUNITY): Payer: BC Managed Care – PPO

## 2020-03-18 ENCOUNTER — Other Ambulatory Visit: Payer: BC Managed Care – PPO

## 2020-03-18 ENCOUNTER — Encounter (HOSPITAL_COMMUNITY): Payer: BC Managed Care – PPO

## 2020-03-19 ENCOUNTER — Ambulatory Visit (INDEPENDENT_AMBULATORY_CARE_PROVIDER_SITE_OTHER): Payer: BC Managed Care – PPO | Admitting: *Deleted

## 2020-03-19 ENCOUNTER — Other Ambulatory Visit: Payer: Self-pay

## 2020-03-19 ENCOUNTER — Ambulatory Visit (INDEPENDENT_AMBULATORY_CARE_PROVIDER_SITE_OTHER): Payer: BC Managed Care – PPO

## 2020-03-19 VITALS — BP 134/89 | HR 68 | Ht 64.0 in | Wt 237.0 lb

## 2020-03-19 DIAGNOSIS — R102 Pelvic and perineal pain: Secondary | ICD-10-CM

## 2020-03-19 DIAGNOSIS — I10 Essential (primary) hypertension: Secondary | ICD-10-CM

## 2020-03-19 DIAGNOSIS — Z0130 Encounter for examination of blood pressure without abnormal findings: Secondary | ICD-10-CM | POA: Diagnosis not present

## 2020-03-19 NOTE — Progress Notes (Signed)
   NURSE VISIT- BLOOD PRESSURE CHECK  SUBJECTIVE:  Kim Duran is a 34 y.o. [phone] female here for BP check. She is a GYN patient    HYPERTENSION ROS:  .   GYN patient: . Taking medicines as instructed yes . Headaches  Yes . Chest pain NO . Shortness of breath No . Swelling in legs/ankles No  OBJECTIVE:  Ht 5\' 4"  (1.626 m)   LMP 03/04/2020   BMI 41.80 kg/m   Appearance alert, well appearing, and in no distress.  ASSESSMENT: GYN  blood pressure check  PLAN: Discussed with 03/06/2020, AGNP   Recommendations: continue taking BP med. Watch salt and sugar in diet. Schedule another BP check in 8 weeks with nurse.    Follow-up: 8 weeks   Cyril Mourning  03/19/2020 5:05 PM

## 2020-03-19 NOTE — Progress Notes (Signed)
Chart reviewed for nurse visit. Agree with plan of care.  Adline Potter, NP 03/19/2020 5:18 PM

## 2020-03-19 NOTE — Progress Notes (Signed)
PELVIC US TA/TV:heterogeneous anterior fundal myometrium,anteverted uterus,heterogeneous endometrium 16.6 mm (no color flow),normal right ovary,right ovary appears mobile,left oophorectomy,no free fluid,no pain during ultrasound  Chaperone Tammy

## 2020-03-24 ENCOUNTER — Encounter: Payer: Self-pay | Admitting: *Deleted

## 2020-03-24 ENCOUNTER — Telehealth: Payer: Self-pay | Admitting: Women's Health

## 2020-03-24 NOTE — Telephone Encounter (Signed)
Patient would like nurse to follow up with her regarding imaging results.

## 2020-03-31 ENCOUNTER — Other Ambulatory Visit: Payer: Self-pay

## 2020-03-31 ENCOUNTER — Encounter: Payer: BC Managed Care – PPO | Admitting: Women's Health

## 2020-04-01 ENCOUNTER — Telehealth (INDEPENDENT_AMBULATORY_CARE_PROVIDER_SITE_OTHER): Payer: BC Managed Care – PPO | Admitting: Women's Health

## 2020-04-01 ENCOUNTER — Encounter: Payer: Self-pay | Admitting: Women's Health

## 2020-04-01 VITALS — BP 122/90

## 2020-04-01 DIAGNOSIS — D649 Anemia, unspecified: Secondary | ICD-10-CM | POA: Diagnosis not present

## 2020-04-01 DIAGNOSIS — I10 Essential (primary) hypertension: Secondary | ICD-10-CM

## 2020-04-01 DIAGNOSIS — R7989 Other specified abnormal findings of blood chemistry: Secondary | ICD-10-CM

## 2020-04-01 DIAGNOSIS — F418 Other specified anxiety disorders: Secondary | ICD-10-CM

## 2020-04-01 DIAGNOSIS — R102 Pelvic and perineal pain: Secondary | ICD-10-CM | POA: Diagnosis not present

## 2020-04-01 DIAGNOSIS — N644 Mastodynia: Secondary | ICD-10-CM

## 2020-04-01 NOTE — Progress Notes (Signed)
This encounter was created in error - please disregard.

## 2020-04-01 NOTE — Progress Notes (Signed)
TELEHEALTH VIRTUAL GYN VISIT ENCOUNTER NOTE Patient name: Kim Duran MRN [id number]  Date of birth: [birth year]  I connected with patient on 04/01/20 at 10:30 AM EDT by phone (couldn't get her internet to stay connected for mychart) and verified that I am speaking with the correct person using two identifiers.  Pt is not currently in the office, she is at home.  Provider is in the office.    I discussed the limitations, risks, security and privacy concerns of performing an evaluation and management service by telephone and the availability of in person appointments. I also discussed with the patient that there may be a patient responsible charge related to this service. The patient expressed understanding and agreed to proceed.   Chief Complaint:   Follow-up  History of Present Illness:   Kim Duran is a 34 y.o. [phone] African American female being evaluated today for f/u on multiple things.  Had appt w/ me 8/23, reported pain w/ ovulation and heavy periods- got pelvic u/s which was normal, per [facility]'s note can try IUD-pt wants to think about this. She was started on celexa 20mg  for dep/anx at that visit, feeling better, has appt for therapy 9/29. PHQ was 20, now 15 today. She was dx w/ HTN at that visit and started on norvasc 5mg , bp much better now. She also reported Lt axilla/breast pain- had to wait til 6wk s/p covid vaccine, so has mammo and u/s scheduled for next week. We checked labs at that visit as well, her VitD was low 24.5- she started VitD supplementation and her hgb was 8.0. She says she was eating ice all day, now that she started Fe supplement she doesn't eat any ice.  Depression screen [facility] 2/9 04/01/2020 03/03/2020 10/27/2017  Decreased Interest 2 3 2   Down, Depressed, Hopeless 2 3 1   PHQ - 2 Score 4 6 3   Altered sleeping 2 2 1   Tired, decreased energy 2 3 2   Change in appetite 2 3 1   Feeling bad or failure about yourself  2 3 1   Trouble concentrating 3 3 0  Moving  slowly or fidgety/restless 0 0 0  Suicidal thoughts 0 0 0  PHQ-9 Score 15 20 8   Difficult doing work/chores - Very difficult -    Patient's last menstrual period was 03/04/2020. The current method of family planning is coitus interruptus.  Last pap 12/30/17. Results were:  normal Review of Systems:   Pertinent items are noted in HPI Denies fever/chills, dizziness, headaches, visual disturbances, fatigue, shortness of breath, chest pain, abdominal pain, vomiting, abnormal vaginal discharge/itching/odor/irritation, problems with periods, bowel movements, urination, or intercourse unless otherwise stated above.  Pertinent History Reviewed:  Reviewed past medical,surgical, social, obstetrical and family history.  Reviewed problem list, medications and allergies. Physical Assessment:   Vitals:   04/01/20 1058  BP: 122/90  There is no height or weight on file to calculate BMI.       Physical Examination:   General:  Alert, oriented and cooperative.   Mental Status: Normal mood and affect perceived. Normal judgment and thought content.  Physical exam deferred due to nature of the encounter  No results found for this or any previous visit (from the past 24 hour(s)).  Assessment & Plan:  1) Pain w/ ovulation/heavy periods> normal pelvic u/s, discussed IUD, will let know if she decides for this (needs to be on period or 14d from last sex)  2) Dep/anx> improving on celexa 20mg , keep appt w/ therapist for 9/29  3) CHTN> on norvasc 5mg , bp much better  4) Lt axilla/breast pain> has mammogram and u/s next week  5) Anemia> Hgb 8.0 on 8/23, taking fe bid, repeat cbc  6) Low VitD> 24.5, now on VitD supplement, repeat VitD  Meds: No orders of the defined types were placed in this encounter.   Orders Placed This Encounter  Procedures  . CBC  . VITAMIN D 25 Hydroxy (Vit-D Deficiency, Fractures)    I discussed the assessment and treatment plan with the patient. The patient was provided  an opportunity to ask questions and all were answered. The patient agreed with the plan and demonstrated an understanding of the instructions.   The patient was advised to call back or seek an in-person evaluation/go to the ED if the symptoms worsen or if the condition fails to improve as anticipated.  I provided 15 minutes of non-face-to-face time during this encounter.   Return for Friday for labs.  Friday CNM, [facility] 04/01/2020 1:06 PM

## 2020-04-07 NOTE — BH Specialist Note (Deleted)
Integrated Behavioral Health via Telemedicine Video (Caregility) Visit  04/07/2020 Shalandria Elsbernd [id number]  Number of Integrated Behavioral Health visits: 1 Session Start time: 1:15***  Session End time: 2:15*** Total time: {IBH Total Time:21014050} minutes  Referring Provider: Shawna Clamp, CNM Type of Service: Individual*** Patient/Family location: Home [facility] Provider location: [facility]  All persons participating in visit: Patient *** and [facility] Manjot Beumer ***   I connected with Herbie Drape and/or Geetika Dorman's {family members:20773} by a video enabled telemedicine application (Caregility) and verified that I am speaking with the correct person using two identifiers.   Discussed confidentiality: {YES/NO:21197}  Confirmed demographics & insurance:  {YES/NO:21197}  I discussed that engaging in this virtual visit, they consent to the provision of behavioral healthcare and the services will be billed under their insurance.   Patient and/or legal guardian expressed understanding and consented to virtual visit: {YES/NO:21197}  PRESENTING CONCERNS: Patient and/or family reports the following symptoms/concerns: *** Duration of problem: ***; Severity of problem: {Mild/Moderate/Severe:20260}  STRENGTHS (Protective Factors/Coping Skills): {CHL AMB BH PROTECTIVE FACTORS/STRENGTHS:[phone]}  ASSESSMENT: Patient currently experiencing ***.    GOALS ADDRESSED: Patient will: 1.  Reduce symptoms of: {IBH Symptoms:21014056}  2.  Increase knowledge and/or ability of: {IBH Patient Tools:21014057}  3.  Demonstrate ability to: {IBH Goals:21014053}   Progress of Goals: {CHL AMB BH PROGRESS TOWARDS YKDXI:3382505397}  INTERVENTIONS: Interventions utilized:  {IBH Interventions:21014054} Standardized Assessments completed & reviewed: {IBH Screening Tools:21014051}   OUTCOME: Patient Response:  ***   PLAN: 1. Follow up with behavioral health clinician on : *** 2. Behavioral recommendations: *** 3. Referral(s): {IBH Referrals:21014055}  I discussed the assessment and treatment plan with the patient and/or parent/guardian. They were provided an opportunity to ask questions and all were answered. They agreed with the plan and demonstrated an understanding of the instructions.   They were advised to call back or seek an in-person evaluation as appropriate.  I discussed that the purpose of this visit is to provide behavioral health care while limiting exposure to the novel coronavirus.  Discussed there is a possibility of technology failure and discussed alternative modes of communication if that failure occurs.  Valetta Close Auna Mikkelsen    Depression screen [facility] 2/9 04/01/2020 03/03/2020 10/27/2017  Decreased Interest 2 3 2   Down, Depressed, Hopeless 2 3 1   PHQ - 2 Score 4 6 3   Altered sleeping 2 2 1   Tired, decreased energy 2 3 2   Change in appetite 2 3 1   Feeling bad or failure about yourself  2 3 1   Trouble concentrating 3 3 0  Moving slowly or fidgety/restless 0 0 0  Suicidal thoughts 0 0 0  PHQ-9 Score 15 20 8   Difficult doing work/chores - Very difficult -   GAD 7 : Generalized Anxiety Score 03/03/2020  Nervous, Anxious, on Edge 3  Control/stop worrying 3  Worry too much - different things 3  Trouble relaxing 3  Restless 3  Easily annoyed or irritable 3  Afraid - awful might happen 2  Total GAD 7 Score 20  Anxiety Difficulty Somewhat difficult

## 2020-04-08 ENCOUNTER — Ambulatory Visit (HOSPITAL_COMMUNITY): Payer: BC Managed Care – PPO

## 2020-04-08 ENCOUNTER — Ambulatory Visit (HOSPITAL_COMMUNITY): Admission: RE | Admit: 2020-04-08 | Payer: BC Managed Care – PPO | Source: Ambulatory Visit

## 2020-04-08 ENCOUNTER — Inpatient Hospital Stay (HOSPITAL_COMMUNITY): Admission: RE | Admit: 2020-04-08 | Payer: BC Managed Care – PPO | Source: Ambulatory Visit

## 2020-04-15 ENCOUNTER — Encounter: Payer: BC Managed Care – PPO | Admitting: Obstetrics & Gynecology

## 2020-04-23 ENCOUNTER — Ambulatory Visit: Payer: BC Managed Care – PPO | Admitting: Advanced Practice Midwife

## 2020-11-02 DIAGNOSIS — S92352A Displaced fracture of fifth metatarsal bone, left foot, initial encounter for closed fracture: Secondary | ICD-10-CM | POA: Diagnosis not present

## 2020-11-02 DIAGNOSIS — S92354A Nondisplaced fracture of fifth metatarsal bone, right foot, initial encounter for closed fracture: Secondary | ICD-10-CM | POA: Diagnosis not present

## 2020-11-10 DIAGNOSIS — S92352A Displaced fracture of fifth metatarsal bone, left foot, initial encounter for closed fracture: Secondary | ICD-10-CM | POA: Diagnosis not present

## 2020-11-12 ENCOUNTER — Other Ambulatory Visit: Payer: Self-pay | Admitting: Women's Health

## 2020-12-11 DIAGNOSIS — S92355D Nondisplaced fracture of fifth metatarsal bone, left foot, subsequent encounter for fracture with routine healing: Secondary | ICD-10-CM | POA: Diagnosis not present

## 2020-12-25 DIAGNOSIS — S92355D Nondisplaced fracture of fifth metatarsal bone, left foot, subsequent encounter for fracture with routine healing: Secondary | ICD-10-CM | POA: Diagnosis not present

## 2021-07-30 ENCOUNTER — Other Ambulatory Visit: Payer: Self-pay | Admitting: Women's Health

## 2021-09-09 ENCOUNTER — Other Ambulatory Visit: Payer: BC Managed Care – PPO | Admitting: Women's Health

## 2021-09-22 ENCOUNTER — Encounter: Payer: Self-pay | Admitting: Women's Health

## 2021-09-22 ENCOUNTER — Other Ambulatory Visit (HOSPITAL_COMMUNITY)
Admission: RE | Admit: 2021-09-22 | Discharge: 2021-09-22 | Disposition: A | Discharge status: Home / Self Care | Payer: BC Managed Care – PPO | Source: Ambulatory Visit | Attending: Women's Health | Admitting: Women's Health

## 2021-09-22 ENCOUNTER — Other Ambulatory Visit: Payer: Self-pay

## 2021-09-22 ENCOUNTER — Ambulatory Visit (INDEPENDENT_AMBULATORY_CARE_PROVIDER_SITE_OTHER): Payer: BC Managed Care – PPO | Admitting: Women's Health

## 2021-09-22 VITALS — BP 151/95 | HR 79 | Ht 64.0 in | Wt 257.0 lb

## 2021-09-22 DIAGNOSIS — Z01419 Encounter for gynecological examination (general) (routine) without abnormal findings: Secondary | ICD-10-CM | POA: Insufficient documentation

## 2021-09-22 DIAGNOSIS — Z1329 Encounter for screening for other suspected endocrine disorder: Secondary | ICD-10-CM | POA: Diagnosis not present

## 2021-09-22 DIAGNOSIS — Z131 Encounter for screening for diabetes mellitus: Secondary | ICD-10-CM | POA: Diagnosis not present

## 2021-09-22 DIAGNOSIS — R399 Unspecified symptoms and signs involving the genitourinary system: Secondary | ICD-10-CM

## 2021-09-22 DIAGNOSIS — Z0000 Encounter for general adult medical examination without abnormal findings: Secondary | ICD-10-CM

## 2021-09-22 DIAGNOSIS — Z803 Family history of malignant neoplasm of breast: Secondary | ICD-10-CM

## 2021-09-22 DIAGNOSIS — R1031 Right lower quadrant pain: Secondary | ICD-10-CM

## 2021-09-22 DIAGNOSIS — F418 Other specified anxiety disorders: Secondary | ICD-10-CM

## 2021-09-22 DIAGNOSIS — N644 Mastodynia: Secondary | ICD-10-CM

## 2021-09-22 LAB — POCT URINALYSIS DIPSTICK OB
Blood, UA: NEGATIVE
Glucose, UA: NEGATIVE
Ketones, UA: NEGATIVE
Leukocytes, UA: NEGATIVE
Nitrite, UA: NEGATIVE
POC,PROTEIN,UA: NEGATIVE

## 2021-09-22 NOTE — Progress Notes (Signed)
? ?WELL-WOMAN EXAMINATION ?Patient name: Kim Duran MRN [id number]  Date of birth: September 06, 1985 ?Chief Complaint:   ?Gynecologic Exam (Pain in right side; pain in top of left breast that goes into left arm) ? ?History of Present Illness:   ?Kim Duran is a 36 y.o. [phone] African-American female being seen today for a routine well-woman exam.  ?Current complaints: intermittent Lt lower breast pain into axilla x years. Intermittent RLQ pain x 6-60mths, worse when sitting. 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 Hasn't taken bp meds today.  ?Dep/anx, started celexa Aug 2021, states feels it is helping, sometimes feels she needs to increase dosage d/t anxiety. Denies SI/HI. ?Wants urine dipped, no UTI sx.  ? ?PCP: none      ?does desire labs, doesn't want lipids ?Patient's last menstrual period was 09/17/2021. ?The current method of family planning is coitus interruptus.  ?Last pap 12/30/17. Results were: NILM w/ HRHPV negative. H/O abnormal pap: no ?Last mammogram: states she had one @ 36yo Results were: normal. Family h/o breast cancer: yes 2 MA's- dx/passed away in 40s-50s ?Last colonoscopy: never. Results were: N/A. Family h/o colorectal cancer: no ? ?Depression screen [facility] 2/9 09/22/2021 04/01/2020 03/03/2020 10/27/2017  ?Decreased Interest 2 2 3 2   ?Down, Depressed, Hopeless 2 2 3 1   ?PHQ - 2 Score 4 4 6 3   ?Altered sleeping 2 2 2 1   ?Tired, decreased energy 3 2 3 2   ?Change in appetite 1 2 3 1   ?Feeling bad or failure about yourself  1 2 3 1   ?Trouble concentrating 1 3 3  0  ?Moving slowly or fidgety/restless 0 0 0 0  ?Suicidal thoughts 0 0 0 0  ?PHQ-9 Score 12 15 20 8   ?Difficult doing work/chores - - Very difficult -  ? ?  ?GAD 7 : Generalized Anxiety Score 09/22/2021 03/03/2020  ?Nervous, Anxious, on Edge 3 3  ?Control/stop worrying 2 3  ?Worry too much - different things 3 3  ?Trouble relaxing 3 3  ?Restless 3 3  ?Easily annoyed or irritable 3  3  ?Afraid - awful might happen 1 2  ?Total GAD 7 Score 18 20  ?Anxiety Difficulty - Somewhat difficult  ? ? ? ?Review of Systems:   ?Pertinent items are noted in HPI ?Denies any headaches, blurred vision, fatigue, shortness of breath, chest pain, abdominal pain, abnormal vaginal discharge/itching/odor/irritation, problems with periods, bowel movements, urination, or intercourse unless otherwise stated above. ?Pertinent History Reviewed:  ?Reviewed past medical,surgical, social and family history.  ?Reviewed problem list, medications and allergies. ?Physical Assessment:  ? ?Vitals:  ? 09/22/21 1510  ?BP: (!) 151/95  ?Pulse: 79  ?Weight: 257 lb (116.6 kg)  ?Height: 5\' 4"  (1.626 m)  ?Body mass index is 44.11 kg/m?. ?  ?     Physical Examination:  ? General appearance - well appearing, and in no distress ? Mental status - alert, oriented to person, place, and time ? Psych:  She has a normal mood and affect ? Skin - warm and dry, normal color, no suspicious lesions noted ? Chest - effort normal, all lung fields clear to auscultation bilaterally ? Heart - normal rate and regular rhythm ? Neck:  midline trachea, no thyromegaly or nodules ? Breasts - breasts appear normal, no suspicious masses, no skin or nipple changes or  axillary nodes, no pain on palpation of area she reports painful ? Abdomen - soft, nontender, nondistended, no masses or organomegaly ?  Pelvic - VULVA: normal appearing vulva with no masses, tenderness or lesions  VAGINA: normal appearing vagina with normal color and discharge, no lesions  CERVIX: normal appearing cervix without discharge or lesions, no CMT ? Thin prep pap is done w/ HR HPV cotesting ? UTERUS: uterus is felt to be normal size, shape, consistency and nontender  ? ADNEXA: No adnexal masses or tenderness noted. ? Extremities:  No swelling or varicosities noted ? ?Chaperone: Lawanna Kobus Neas   ? ?Results for orders placed or performed in visit on 09/22/21 (from the past 24 hour(s))  ?POC  Urinalysis Dipstick OB  ? Collection Time: 09/22/21  4:24 PM  ?Result Value Ref Range  ? Color, UA    ? Clarity, UA    ? Glucose, UA Negative Negative  ? Bilirubin, UA    ? Ketones, UA neg   ? Spec Grav, UA    ? Blood, UA neg   ? pH, UA    ? POC,PROTEIN,UA Negative Negative, Trace, Small (1+), Moderate (2+), Large (3+), 4+  ? Urobilinogen, UA    ? Nitrite, UA neg   ? Leukocytes, UA Negative Negative  ? Appearance    ? Odor    ?  ?Assessment & Plan:  ?1) Well-Woman Exam ? ?2) CHTN> on norvasc 5mg , hasn't taken today, set alarm to help remind ? ?3) Intermittent Lt breast/axilla pain> will get mammo & u/s, orders placed and note routed to [facility] to schedule/call pt ? ?4) Intermittent RLQ pain x 6-43mths> will get pelvic u/s ? ?5) Dep/anx> can increase celexa to 40mg  prn, will f/u at next visit ? ?Labs/procedures today: as below ? ?Mammogram: diagnostic mammo and breast u/s ordered ?Colonoscopy: @ 36yo, or sooner if problems ? ?Orders Placed This Encounter  ?Procedures  ? 11m BREAST LTD UNI LEFT INC AXILLA  ? BREAST LTD UNI RIGHT INC AXILLA  ? MM DIAG BREAST TOMO BILATERAL  ? CBC  ? Comprehensive metabolic panel  ? TSH  ? Hemoglobin A1c  ? POC Urinalysis Dipstick OB  ? ? ?Meds: No orders of the defined types were placed in this encounter. ? ? ?Follow-up: Return for 1st available, US:GYN and f/u visit after. ? ?[place] CNM, WHNP-BC ?09/22/2021 ?4:57 PM  ?

## 2021-09-23 ENCOUNTER — Encounter: Payer: Self-pay | Admitting: Women's Health

## 2021-09-23 LAB — CBC
Hematocrit: 31.1 % — ABNORMAL LOW (ref 34.0–46.6)
Hemoglobin: 9.2 g/dL — ABNORMAL LOW (ref 11.1–15.9)
MCH: 19.6 pg — ABNORMAL LOW (ref 26.6–33.0)
MCHC: 29.6 g/dL — ABNORMAL LOW (ref 31.5–35.7)
MCV: 66 fL — ABNORMAL LOW (ref 79–97)
Platelets: 511 10*3/uL — ABNORMAL HIGH (ref 150–450)
RBC: 4.7 x10E6/uL (ref 3.77–5.28)
RDW: 23.5 % — ABNORMAL HIGH (ref 11.7–15.4)
WBC: 8.8 10*3/uL (ref 3.4–10.8)

## 2021-09-23 LAB — COMPREHENSIVE METABOLIC PANEL
ALT: 11 IU/L (ref 0–32)
AST: 15 IU/L (ref 0–40)
Albumin/Globulin Ratio: 1.3 (ref 1.2–2.2)
Albumin: 4.6 g/dL (ref 3.8–4.8)
Alkaline Phosphatase: 84 IU/L (ref 44–121)
BUN/Creatinine Ratio: 13 (ref 9–23)
BUN: 12 mg/dL (ref 6–20)
Bilirubin Total: <0.2 mg/dL (ref 0.0–1.2)
CO2: 23 mmol/L (ref 20–29)
Calcium: 9.6 mg/dL (ref 8.7–10.2)
Chloride: 102 mmol/L (ref 96–106)
Creatinine, Ser: 0.9 mg/dL (ref 0.57–1.00)
Globulin, Total: 3.5 g/dL (ref 1.5–4.5)
Glucose: 85 mg/dL (ref 70–99)
Potassium: 4.2 mmol/L (ref 3.5–5.2)
Sodium: 138 mmol/L (ref 134–144)
Total Protein: 8.1 g/dL (ref 6.0–8.5)
eGFR: 85 mL/min/{1.73_m2} (ref >59)

## 2021-09-23 LAB — HEMOGLOBIN A1C
Est. average glucose Bld gHb Est-mCnc: 85 mg/dL
Hgb A1c MFr Bld: 4.6 % — ABNORMAL LOW (ref 4.8–5.6)

## 2021-09-23 LAB — TSH: TSH: 1.16 u[IU]/mL (ref 0.450–4.500)

## 2021-09-25 LAB — CYTOLOGY - PAP
Comment: NEGATIVE
Diagnosis: NEGATIVE
High risk HPV: NEGATIVE

## 2021-10-20 ENCOUNTER — Ambulatory Visit (HOSPITAL_COMMUNITY): Payer: BC Managed Care – PPO

## 2021-10-20 ENCOUNTER — Encounter: Payer: Self-pay | Admitting: Women's Health

## 2021-10-20 ENCOUNTER — Ambulatory Visit (INDEPENDENT_AMBULATORY_CARE_PROVIDER_SITE_OTHER): Payer: BC Managed Care – PPO

## 2021-10-20 ENCOUNTER — Encounter (HOSPITAL_COMMUNITY): Payer: BC Managed Care – PPO

## 2021-10-20 ENCOUNTER — Ambulatory Visit: Payer: BC Managed Care – PPO | Admitting: Women's Health

## 2021-10-20 VITALS — BP 122/85 | HR 80 | Ht 64.0 in | Wt 256.5 lb

## 2021-10-20 DIAGNOSIS — D649 Anemia, unspecified: Secondary | ICD-10-CM

## 2021-10-20 DIAGNOSIS — R1031 Right lower quadrant pain: Secondary | ICD-10-CM | POA: Diagnosis not present

## 2021-10-20 DIAGNOSIS — D75839 Thrombocytosis, unspecified: Secondary | ICD-10-CM

## 2021-10-20 NOTE — Progress Notes (Signed)
PELVIC US TA/TV: homogeneous anteverted uterus,WNL,EEC 13 mm,normal right ovary,left oophorectomy,left adnexa WNL,no free fluid,right ovary appears mobile,no pain during ultrasound  ?

## 2021-10-20 NOTE — Progress Notes (Signed)
? ?GYN VISIT ?Patient name: Kim Duran MRN [id number]  Date of birth: 08/23/1985 ?Chief Complaint:   ?Follow-up (Discuss [place] results) ? ?History of Present Illness:   ?Kim Duran is a 36 y.o. [id number] African-American female being seen today for f/u after pelvic u/s done for intermittent RLQ pain x ~69mths and h/o Lot salpingo-oophorectomy d/t 10lb benign tumor. States she fell around this time, so could be r/t that. Anemic, last hgb 9.2 on 09/22/21, doesn't remember to take Fe. Reports heavy periods, one monthly, lasts 4-5d, at heaviest changes pad q1hr but not b/c saturated- just b/c she doesn't like sitting in blood. Some clots, largest about baseball size. Some cramps at beginning of period. Doesn't want birth control/hormones, may be interested in surgical management if it's an option. Plt count 511 on 09/22/21, doesn't smoke. Was 464 in Aug 2021.  ?Patient's last menstrual period was 10/15/2021. ?The current method of family planning is coitus interruptus.  ?Last pap 09/22/21. Results were: NILM w/ HRHPV negative ? ? ?  10/20/2021  ?  2:55 PM 09/22/2021  ?  3:25 PM 04/01/2020  ? 10:58 AM 03/03/2020  ?  9:49 AM 10/27/2017  ?  4:32 PM  ?Depression screen PHQ 2/9  ?Decreased Interest 1 2 2 3 2   ?Down, Depressed, Hopeless 1 2 2 3 1   ?PHQ - 2 Score 2 4 4 6 3   ?Altered sleeping 1 2 2 2 1   ?Tired, decreased energy 1 3 2 3 2   ?Change in appetite 2 1 2 3 1   ?Feeling bad or failure about yourself  1 1 2 3 1   ?Trouble concentrating 2 1 3 3  0  ?Moving slowly or fidgety/restless 2 0 0 0 0  ?Suicidal thoughts 0 0 0 0 0  ?PHQ-9 Score 11 12 15 20 8   ?Difficult doing work/chores    Very difficult   ? ?  ? ?  10/20/2021  ?  2:56 PM 09/22/2021  ?  3:26 PM 03/03/2020  ?  9:55 AM  ?GAD 7 : Generalized Anxiety Score  ?Nervous, Anxious, on Edge 2 3 3   ?Control/stop worrying 1 2 3   ?Worry too much - different things 1 3 3   ?Trouble relaxing 2 3 3   ?Restless 2 3 3   ?Easily annoyed or irritable 2 3 3   ?Afraid - awful might happen 1 1  2   ?Total GAD 7 Score 11 18 20   ?Anxiety Difficulty   Somewhat difficult  ? ? ? ?Review of Systems:   ?Pertinent items are noted in HPI ?Denies fever/chills, dizziness, headaches, visual disturbances, fatigue, shortness of breath, chest pain, abdominal pain, vomiting, abnormal vaginal discharge/itching/odor/irritation, problems with periods, bowel movements, urination, or intercourse unless otherwise stated above.  ?Pertinent History Reviewed:  ?Reviewed past medical,surgical, social, obstetrical and family history.  ?Reviewed problem list, medications and allergies. ?Physical Assessment:  ? ?Vitals:  ? 10/20/21 1452  ?BP: 122/85  ?Pulse: 80  ?Weight: 256 lb 8 oz (116.3 kg)  ?Height: 5\' 4"  (1.626 m)  ?Body mass index is 44.03 kg/m?. ? ?     Physical Examination:  ? General appearance: alert, well appearing, and in no distress ? Mental status: alert, oriented to person, place, and time ? Skin: warm & dry  ? Cardiovascular: normal heart rate noted ? Respiratory: normal respiratory effort, no distress ? Abdomen: soft, non-tender  ? Pelvic: examination not indicated ? Extremities: no edema  ? ?Chaperone: N/A   ? ?Pelvic u/s today: Kim Duran is a 36 y.o. Patient's  last menstrual period was 10/15/2021.She is here for a pelvic sonogram for right lower quadrant pain. ?  ?Uterus                      6.4 x 5.4 x 6.7 cm, Total uterine volume 121 cc, homogeneous anteverted uterus,WNL ?  ?Endometrium          13 mm, symmetrical, wnl ?  ?Right ovary             3.5 x 2.8 x 3 cm, normal ?  ?Left ovary                Left oophorectomy,left adnexa WNL ?  ?No free fluid  ?  ?Technician Comments: ?  ?PELVIC US TA/TV: homogeneous anteverted uterus,WNL,EEC 13 mm,normal right ovary,left oophorectomy,left adnexa WNL,no free fluid,right ovary appears mobile,no pain during ultrasound  ?  ?Chaperone: Kim Duran ?Kim Duran ?10/20/2021 ?2:59 PM ?  ? ?No results found for this or any previous visit (from the past 24 hour(s)).   ?Assessment & Plan:  ?1) Intermittent RLQ pain x ~59mths> normal pelvic u/s, pt reports she fell around that time, can discuss w/ PCP and see if PT is an option ? ?2) Anemia w/ somewhat heavy periods> forgets to take Fe, doesn't want birth control/hormones, discussed other options, may be interested in endometrial ablation- gave pamphlet to take home and review. State she wants to discuss w/ husband, if decides she wants to try something, will call back and make appt w/ MD ? ?3) Thrombocytosis> since Aug 2021, not a smoker, will refer to hematology- order placed ? ?Meds: No orders of the defined types were placed in this encounter. ? ? ?Orders Placed This Encounter  ?Procedures  ? Ambulatory referral to Hematology / Oncology  ? ? ?Return in about 1 year (around 10/21/2022) for Physical. ? ?Kim Duran CNM, WHNP-BC ?10/20/2021 ?4:50 PM  ?

## 2021-10-29 ENCOUNTER — Encounter: Payer: Self-pay | Admitting: Oncology

## 2021-10-29 ENCOUNTER — Inpatient Hospital Stay: Payer: BC Managed Care – PPO

## 2021-10-29 ENCOUNTER — Inpatient Hospital Stay: Payer: BC Managed Care – PPO | Attending: Oncology | Admitting: Oncology

## 2021-10-29 VITALS — BP 136/87 | HR 82 | Temp 97.1°F | Wt 255.0 lb

## 2021-10-29 DIAGNOSIS — Z5941 Food insecurity: Secondary | ICD-10-CM | POA: Insufficient documentation

## 2021-10-29 DIAGNOSIS — Z8249 Family history of ischemic heart disease and other diseases of the circulatory system: Secondary | ICD-10-CM | POA: Diagnosis not present

## 2021-10-29 DIAGNOSIS — D500 Iron deficiency anemia secondary to blood loss (chronic): Secondary | ICD-10-CM | POA: Diagnosis not present

## 2021-10-29 DIAGNOSIS — Z596 Low income: Secondary | ICD-10-CM | POA: Diagnosis not present

## 2021-10-29 DIAGNOSIS — Z90721 Acquired absence of ovaries, unilateral: Secondary | ICD-10-CM | POA: Insufficient documentation

## 2021-10-29 DIAGNOSIS — Z8041 Family history of malignant neoplasm of ovary: Secondary | ICD-10-CM | POA: Insufficient documentation

## 2021-10-29 DIAGNOSIS — D509 Iron deficiency anemia, unspecified: Secondary | ICD-10-CM | POA: Insufficient documentation

## 2021-10-29 DIAGNOSIS — Z8042 Family history of malignant neoplasm of prostate: Secondary | ICD-10-CM | POA: Insufficient documentation

## 2021-10-29 DIAGNOSIS — Z79899 Other long term (current) drug therapy: Secondary | ICD-10-CM | POA: Diagnosis not present

## 2021-10-29 DIAGNOSIS — N920 Excessive and frequent menstruation with regular cycle: Secondary | ICD-10-CM | POA: Diagnosis not present

## 2021-10-29 DIAGNOSIS — Z803 Family history of malignant neoplasm of breast: Secondary | ICD-10-CM | POA: Insufficient documentation

## 2021-10-29 DIAGNOSIS — Z833 Family history of diabetes mellitus: Secondary | ICD-10-CM | POA: Insufficient documentation

## 2021-10-29 DIAGNOSIS — R5383 Other fatigue: Secondary | ICD-10-CM | POA: Diagnosis not present

## 2021-10-29 DIAGNOSIS — D75839 Thrombocytosis, unspecified: Secondary | ICD-10-CM

## 2021-10-29 DIAGNOSIS — Z800 Family history of malignant neoplasm of digestive organs: Secondary | ICD-10-CM | POA: Insufficient documentation

## 2021-10-29 LAB — IRON AND TIBC
Iron: 368 ug/dL — ABNORMAL HIGH (ref 28–170)
Saturation Ratios: 73 % — ABNORMAL HIGH (ref 10.4–31.8)
TIBC: 504 ug/dL — ABNORMAL HIGH (ref 250–450)
UIBC: 136 ug/dL

## 2021-10-29 LAB — COMPREHENSIVE METABOLIC PANEL
ALT: 16 U/L (ref 0–44)
AST: 18 U/L (ref 15–41)
Albumin: 4 g/dL (ref 3.5–5.0)
Alkaline Phosphatase: 63 U/L (ref 38–126)
Anion gap: 5 (ref 5–15)
BUN: 11 mg/dL (ref 6–20)
CO2: 26 mmol/L (ref 22–32)
Calcium: 9.1 mg/dL (ref 8.9–10.3)
Chloride: 105 mmol/L (ref 98–111)
Creatinine, Ser: 0.87 mg/dL (ref 0.44–1.00)
GFR, Estimated: >60 mL/min (ref >60)
Glucose, Bld: 109 mg/dL — ABNORMAL HIGH (ref 70–99)
Potassium: 4 mmol/L (ref 3.5–5.1)
Sodium: 136 mmol/L (ref 135–145)
Total Bilirubin: 0.3 mg/dL (ref 0.3–1.2)
Total Protein: 8.4 g/dL — ABNORMAL HIGH (ref 6.5–8.1)

## 2021-10-29 LAB — RETIC PANEL
Immature Retic Fract: 30.6 % — ABNORMAL HIGH (ref 2.3–15.9)
RBC.: 4.73 MIL/uL (ref 3.87–5.11)
Retic Count, Absolute: 102.6 10*3/uL (ref 19.0–186.0)
Retic Ct Pct: 2.2 % (ref 0.4–3.1)
Reticulocyte Hemoglobin: 22.9 pg — ABNORMAL LOW (ref >27.9)

## 2021-10-29 LAB — FERRITIN: Ferritin: 5 ng/mL — ABNORMAL LOW (ref 11–307)

## 2021-10-29 NOTE — Progress Notes (Signed)
?Hematology/Oncology Consult note ?[phone] [phone] ?  ? ?   ? ? ?Patient Care Team: ?[facility] as PCP - General ? ?REFERRING PROVIDER: ?Cheral Marker, CNM  ?CHIEF COMPLAINTS/REASON FOR VISIT:  ?Evaluation of anemia and thrombocytosis ? ?HISTORY OF PRESENTING ILLNESS:  ? ?Kim Duran is a  36 y.o.  female with PMH listed below was seen in consultation at the request of  Cheral Marker, CNM  for evaluation of anemia and thrombocytosis ?09/22/2021, patient had a CBC done which showed a hemoglobin of 9.2, MCV 66, platelets 511.  I reviewed patient's previous lab records. ?Patient has chronic anemia, MCV is chronically decreased. ?Patient reports heavy menstrual period.  She is not interested in any further intervention.  Denies blood in the stool. ?Patient is married for 5 children.  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  She feels very fatigued,  ?She craves ice chips. ? ?Review of Systems  ?Constitutional:  Positive for fatigue. Negative for appetite change, chills and fever.  ?HENT:   Negative for hearing loss and voice change.   ?Eyes:  Negative for eye problems.  ?Respiratory:  Negative for chest tightness and cough.   ?Cardiovascular:  Negative for chest pain.  ?Gastrointestinal:  Negative for abdominal distention, abdominal pain and blood in stool.  ?Endocrine: Negative for hot flashes.  ?Genitourinary:  Negative for difficulty urinating and frequency.   ?Musculoskeletal:  Negative for arthralgias.  ?Skin:  Negative for itching and rash.  ?Neurological:  Negative for extremity weakness.  ?Hematological:  Negative for adenopathy.  ?Psychiatric/Behavioral:  Negative for confusion.   ? ?MEDICAL HISTORY:  ?Past Medical History:  ?Diagnosis Date  ? Medical history non-contributory   ? ? ?SURGICAL HISTORY: ?Past Surgical History:  ?Procedure Laterality Date  ? CESAREAN SECTION    ? CESAREAN SECTION N/A 01/23/2018  ? Procedure: CESAREAN SECTION;  Surgeon:  Conan Bowens, MD;  Location: [facility] BIR[facility] SUITES;  Service: Obstetrics;  Laterality: N/A;  ? left ovary and tube removed  08/16/2009  ? 10lb tumor "wrapped around ovary"   ? ? ?SOCIAL HISTORY: ?Social History  ? ?Socioeconomic History  ? Marital status: Married  ?  Spouse name: Not on file  ? Number of children: Not on file  ? Years of education: Not on file  ? Highest education level: Not on file  ?Occupational History  ? Not on file  ?Tobacco Use  ? Smoking status: Never  ? Smokeless tobacco: Never  ?Vaping Use  ? Vaping Use: Never used  ?Substance and Sexual Activity  ? Alcohol use: No  ? Drug use: No  ? Sexual activity: Yes  ?  Birth control/protection: None  ?Other Topics Concern  ? Not on file  ?Social History Narrative  ? Not on file  ? ?Social Determinants of Health  ? ?Financial Resource Strain: Medium Risk  ? Difficulty of Paying Living Expenses: Somewhat hard  ?Food Insecurity: Food Insecurity Present  ? Worried About Programme researcher, broadcasting/film/video in the Last Year: Sometimes true  ? Ran Out of Food in the Last Year: Sometimes true  ?Transportation Needs: No Transportation Needs  ? Lack of Transportation (Medical): No  ? Lack of Transportation (Non-Medical): No  ?Physical Activity: Insufficiently Active  ? Days of Exercise per Week: 2 days  ? Minutes of Exercise per Session: 30 min  ?Stress: Stress Concern Present  ? Feeling of Stress : Very much  ?Social Connections: Socially Integrated  ? Frequency of Communication with Friends and  Family: More than three times a week  ? Frequency of Social Gatherings with Friends and Family: Never  ? Attends Religious Services: More than 4 times per year  ? Active Member of Clubs or Organizations: Yes  ? Attends Banker Meetings: Never  ? Marital Status: Married  ?Intimate Partner Violence: Not At Risk  ? Fear of Current or Ex-Partner: No  ? Emotionally Abused: No  ? Physically Abused: No  ? Sexually Abused: No  ? ? ?FAMILY HISTORY: ?Family History  ?Problem  Relation Age of Onset  ? Hypertension Mother   ? Diabetes Mother   ?     pre-diabetes  ? Hypertension Father   ? Cancer Paternal Aunt   ?     breast cancer  ? Cancer Paternal Aunt   ?     breast  ? Cancer Maternal Grandmother   ?     liver  ? Cancer Paternal Grandfather   ? Cancer Maternal Grandfather   ?     prostate  ? Premature birth Daughter   ?     born at 54 weeks  ? Diabetes Paternal Aunt   ? Cancer Other   ?     breast and ovarian ca. relation unspecified  ? ? ?ALLERGIES:  has No Known Allergies. ? ?MEDICATIONS:  ?Current Outpatient Medications  ?Medication Sig Dispense Refill  ? amLODipine (NORVASC) 5 MG tablet TAKE 1 TABLET BY MOUTH EVERY DAY 30 tablet 6  ? citalopram (CELEXA) 20 MG tablet TAKE 1 TABLET BY MOUTH EVERY DAY 90 tablet 0  ? Cyanocobalamin (VITAMIN B-12 PO) Take by mouth.    ? Ferrous Sulfate (IRON PO) Take by mouth. Takes 2 tabs daily    ? Multiple Vitamin (MULTIVITAMIN) tablet Take 1 tablet by mouth daily.    ? Pyridoxine HCl (VITAMIN B-6 PO) Take by mouth.    ? VITAMIN D PO Take by mouth.    ? ?No current facility-administered medications for this visit.  ? ? ? ?PHYSICAL EXAMINATION: ?ECOG PERFORMANCE STATUS:. ?Vitals:  ? 10/29/21 1530  ?BP: 136/87  ?Pulse: 82  ?Temp: (!) 97.1 ?F (36.2 ?C)  ? ?Filed Weights  ? 10/29/21 1530  ?Weight: 255 lb (115.7 kg)  ? ? ?Physical Exam ?Constitutional:   ?   General: She is not in acute distress. ?   Appearance: She is obese.  ?HENT:  ?   Head: Normocephalic and atraumatic.  ?Eyes:  ?   General: No scleral icterus. ?Cardiovascular:  ?   Rate and Rhythm: Normal rate and regular rhythm.  ?   Heart sounds: Normal heart sounds.  ?Pulmonary:  ?   Effort: Pulmonary effort is normal. No respiratory distress.  ?   Breath sounds: No wheezing.  ?Abdominal:  ?   General: Bowel sounds are normal. There is no distension.  ?   Palpations: Abdomen is soft.  ?Musculoskeletal:     ?   General: No deformity. Normal range of motion.  ?   Cervical back: Normal range of motion  and neck supple.  ?Skin: ?   General: Skin is warm and dry.  ?   Findings: No erythema or rash.  ?Neurological:  ?   Mental Status: She is alert and oriented to person, place, and time. Mental status is at baseline.  ?   Cranial Nerves: No cranial nerve deficit.  ?   Coordination: Coordination normal.  ?Psychiatric:     ?   Mood and Affect: Mood normal.  ? ? ?  LABORATORY DATA:  ?I have reviewed the data as listed ?Lab Results  ?Component Value Date  ? WBC 8.8 09/22/2021  ? HGB 9.2 (L) 09/22/2021  ? HCT 31.1 (L) 09/22/2021  ? MCV 66 (L) 09/22/2021  ? PLT 511 (H) 09/22/2021  ? ?Recent Labs  ?  09/22/21 ?1614  ?NA 138  ?K 4.2  ?CL 102  ?CO2 23  ?GLUCOSE 85  ?BUN 12  ?CREATININE 0.90  ?CALCIUM 9.6  ?PROT 8.1  ?ALBUMIN 4.6  ?AST 15  ?ALT 11  ?ALKPHOS 84  ?BILITOT <0.2  ? ?Iron/TIBC/Ferritin/ %Sat ?No results found for: IRON, TIBC, FERRITIN, IRONPCTSAT  ? ? ?RADIOGRAPHIC STUDIES: ?I have personally reviewed the radiological images as listed and agreed with the findings in the report. ?US PELVIS TRANSVAGINAL NON-OB (TV ONLY) ? ?Result Date: 10/20/2021 ?Images from the original result were not included.  ..an Financial traderAmerican Institute of Ultrasound Medicine Technical sales engineer(AIUM) accredited practice Center for [facility] [address] [place] [id number] Ordering Provider: Cheral MarkerBooker, Kimberly R, CNM                                                                                                                      GYNECOLOGIC SONOGRAM Herbie DrapeBelinda Bonser is a 36 y.o. [id number] Patient's last menstrual period was 10/15/2021.She is here for a pelvic sonogram for right lower quadrant pain. Uterus                      6.4 x 5.4 x 6.7 cm, Total uterine volume 121 cc, homogeneous anteverted uterus,WNL Endometrium          13 mm, symmetrical, wnl Right ovary             3.5 x 2.8 x 3 cm, normal Left ovary                Left oophorectomy,left adnexa WNL No free fluid Technician Comments: PELVIC US TA/TV: homogeneous anteverted  uterus,WNL,EEC 13 mm,normal right ovary,left oophorectomy,left adnexa WNL,no free fluid,right ovary appears mobile,no pain during ultrasound Chaperone: Murtis Sinkeggy Amber J Carl 10/20/2021 2:59 PM Clinical Impression and

## 2021-10-30 ENCOUNTER — Encounter: Payer: Self-pay | Admitting: Oncology

## 2021-10-30 ENCOUNTER — Telehealth: Payer: Self-pay

## 2021-10-30 DIAGNOSIS — D509 Iron deficiency anemia, unspecified: Secondary | ICD-10-CM

## 2021-10-30 DIAGNOSIS — D500 Iron deficiency anemia secondary to blood loss (chronic): Secondary | ICD-10-CM

## 2021-10-30 LAB — CBC WITH DIFFERENTIAL/PLATELET
Abs Immature Granulocytes: 0.16 10*3/uL — ABNORMAL HIGH (ref 0.00–0.07)
Band Neutrophils: 0 %
Basophils Absolute: 0 10*3/uL (ref 0.0–0.1)
Basophils Relative: 0 %
Blasts: 0 %
Eosinophils Absolute: 0.1 10*3/uL (ref 0.0–0.5)
Eosinophils Relative: 1 %
HCT: 30.7 % — ABNORMAL LOW (ref 36.0–46.0)
Hemoglobin: 9.2 g/dL — ABNORMAL LOW (ref 12.0–15.0)
Lymphocytes Relative: 17 %
Lymphs Abs: 1.4 10*3/uL (ref 0.7–4.0)
MCH: 19.5 pg — ABNORMAL LOW (ref 26.0–34.0)
MCHC: 30 g/dL (ref 30.0–36.0)
MCV: 64.9 fL — ABNORMAL LOW (ref 80.0–100.0)
Metamyelocytes Relative: 2 %
Monocytes Absolute: 0.7 10*3/uL (ref 0.1–1.0)
Monocytes Relative: 9 %
Myelocytes: 0 %
Neutro Abs: 5.6 10*3/uL (ref 1.7–7.7)
Neutrophils Relative %: 71 %
Other: 0 %
Platelets: 461 10*3/uL — ABNORMAL HIGH (ref 150–400)
Promyelocytes Relative: 0 %
RBC: 4.73 MIL/uL (ref 3.87–5.11)
RDW: 23.8 % — ABNORMAL HIGH (ref 11.5–15.5)
Smear Review: INCREASED
WBC: 8 10*3/uL (ref 4.0–10.5)
nRBC: 0 % (ref 0.0–0.2)
nRBC: 0 /100 WBC

## 2021-10-30 MED ORDER — FERROUS SULFATE 325 (65 FE) MG PO TBEC: 325.0000 mg | DELAYED_RELEASE_TABLET | Freq: Two times a day (BID) | ORAL | 1 refills | Status: DC

## 2021-10-30 NOTE — Telephone Encounter (Signed)
-----   Message from Rickard Patience, MD sent at 10/29/2021  9:21 PM EDT ----- ?Please let patient know that her iron level is low. ?Recommend IV Venofer weekly x4.  If she is okay with the plan, please arrange urine pregnancy testing prior to each Venofer treatments.  Follow-up in 3 months, labs prior to MD +/- Venofer.  ? If she prefers to have oral iron supplementation, please send a prescription of ferrous sulfate 325 mg twice daily or OTC if she prefers.  In her follow-up will be 2 months, iron labs prior to MD +/- Venofer.  Thank you ? ?

## 2021-10-30 NOTE — Telephone Encounter (Signed)
Patient informed of results and prefers to try oral iron first. Follow up plan discussed with patient and she verbalized understanding. Ferrous sulfate rx sent to [facility]. ? ?Please schedule patient for labs in 2 months, then MD/ venofer *new* 1-2 days after labs. Please inform pt of appt details.  ?

## 2021-10-30 NOTE — Telephone Encounter (Signed)
Pt also asked why ferritin level was low and Iron levels were high.  ?

## 2021-10-30 NOTE — Telephone Encounter (Signed)
Please advise.  ? ?Just FYI, pt elected to try oral iron first and follow up in 2 month.  ?

## 2021-11-02 ENCOUNTER — Encounter: Payer: Self-pay | Admitting: Oncology

## 2021-11-09 ENCOUNTER — Encounter: Payer: Self-pay | Admitting: Women's Health

## 2021-11-10 ENCOUNTER — Ambulatory Visit (HOSPITAL_COMMUNITY)
Admission: RE | Admit: 2021-11-10 | Discharge: 2021-11-10 | Disposition: A | Discharge status: Home / Self Care | Payer: BC Managed Care – PPO | Source: Ambulatory Visit | Attending: Women's Health | Admitting: Women's Health

## 2021-11-10 DIAGNOSIS — Z803 Family history of malignant neoplasm of breast: Secondary | ICD-10-CM | POA: Insufficient documentation

## 2021-11-10 DIAGNOSIS — N644 Mastodynia: Secondary | ICD-10-CM

## 2021-11-10 DIAGNOSIS — R928 Other abnormal and inconclusive findings on diagnostic imaging of breast: Secondary | ICD-10-CM | POA: Diagnosis not present

## 2021-11-11 ENCOUNTER — Other Ambulatory Visit (HOSPITAL_COMMUNITY): Payer: Self-pay | Admitting: Women's Health

## 2021-11-11 ENCOUNTER — Inpatient Hospital Stay
Admission: RE | Admit: 2021-11-11 | Discharge: 2021-11-11 | Disposition: A | Discharge status: Home / Self Care | Payer: Self-pay | Source: Ambulatory Visit | Attending: Women's Health | Admitting: Women's Health

## 2021-11-11 DIAGNOSIS — Z1231 Encounter for screening mammogram for malignant neoplasm of breast: Secondary | ICD-10-CM

## 2021-11-21 ENCOUNTER — Other Ambulatory Visit: Payer: Self-pay | Admitting: Oncology

## 2021-11-23 ENCOUNTER — Encounter: Payer: Self-pay | Admitting: Oncology

## 2021-11-23 NOTE — Telephone Encounter (Signed)
Iron and TIBC ?Order: [id number] ?Status: Final result    ?Visible to patient: Yes (seen)    ?Next appt: 12/28/2021 at 03:00 PM in Oncology ([facility])    ?Dx: Microcytic anemia; Thrombocytosis    ?2 Result Notes   ?1 Follow-up Encounter ?    ?Component Ref Range & Units 3 wk ago  ?Iron 28 - 170 ug/dL 409 High    ?TIBC 250 - 450 ug/dL 811 High    ?Saturation Ratios 10.4 - 31.8 % 73 High    ?UIBC ug/dL 914   ?Comment: Performed at [facility], [address], [place], [place] [address]  ?Resulting Agency  [facility]  ?  ? ?  ?  ?Specimen Collected: 10/29/21 16:12 Last Resulted: 10/29/21 17:18  ?  ?  ?  ? ?

## 2021-11-28 ENCOUNTER — Other Ambulatory Visit: Payer: Self-pay | Admitting: Women's Health

## 2021-11-30 ENCOUNTER — Other Ambulatory Visit: Payer: Self-pay | Admitting: Women's Health

## 2021-12-28 ENCOUNTER — Inpatient Hospital Stay: Payer: BC Managed Care – PPO

## 2021-12-29 ENCOUNTER — Inpatient Hospital Stay: Payer: BC Managed Care – PPO

## 2021-12-29 ENCOUNTER — Telehealth: Payer: Self-pay | Admitting: *Deleted

## 2021-12-29 NOTE — Telephone Encounter (Signed)
Patient called to say that she needed to reschedule her appointments fo this week because she hasn't been taking her Iron pils.

## 2021-12-29 NOTE — Telephone Encounter (Signed)
Please contact pt to r/s all appts, per her availability (non urgent)   Labs, MD/ venofer 1-2 days after labs.

## 2021-12-30 ENCOUNTER — Inpatient Hospital Stay: Payer: BC Managed Care – PPO | Admitting: Oncology

## 2021-12-30 ENCOUNTER — Inpatient Hospital Stay: Payer: BC Managed Care – PPO

## 2022-01-04 ENCOUNTER — Inpatient Hospital Stay: Payer: BC Managed Care – PPO

## 2022-01-06 ENCOUNTER — Ambulatory Visit: Payer: BC Managed Care – PPO

## 2022-01-06 ENCOUNTER — Ambulatory Visit: Payer: BC Managed Care – PPO | Admitting: Oncology

## 2022-01-29 ENCOUNTER — Inpatient Hospital Stay: Payer: BC Managed Care – PPO

## 2022-01-29 ENCOUNTER — Telehealth: Payer: Self-pay | Admitting: Oncology

## 2022-01-29 MED FILL — Iron Sucrose Inj 20 MG/ML (Fe Equiv): INTRAVENOUS | Qty: 10 | Status: AC

## 2022-01-29 NOTE — Telephone Encounter (Signed)
pt called in to have appts moved, double scheduling error

## 2022-02-01 ENCOUNTER — Inpatient Hospital Stay: Payer: BC Managed Care – PPO | Admitting: Oncology

## 2022-02-01 ENCOUNTER — Inpatient Hospital Stay: Payer: BC Managed Care – PPO

## 2022-02-05 ENCOUNTER — Inpatient Hospital Stay: Payer: BC Managed Care – PPO | Attending: Oncology

## 2022-02-05 DIAGNOSIS — Z79899 Other long term (current) drug therapy: Secondary | ICD-10-CM | POA: Diagnosis not present

## 2022-02-05 DIAGNOSIS — N920 Excessive and frequent menstruation with regular cycle: Secondary | ICD-10-CM | POA: Insufficient documentation

## 2022-02-05 DIAGNOSIS — D75838 Other thrombocytosis: Secondary | ICD-10-CM | POA: Diagnosis not present

## 2022-02-05 DIAGNOSIS — D500 Iron deficiency anemia secondary to blood loss (chronic): Secondary | ICD-10-CM | POA: Diagnosis not present

## 2022-02-05 LAB — CBC WITH DIFFERENTIAL/PLATELET
Abs Immature Granulocytes: 0.03 10*3/uL (ref 0.00–0.07)
Basophils Absolute: 0 10*3/uL (ref 0.0–0.1)
Basophils Relative: 1 %
Eosinophils Absolute: 0.2 10*3/uL (ref 0.0–0.5)
Eosinophils Relative: 3 %
HCT: 30.5 % — ABNORMAL LOW (ref 36.0–46.0)
Hemoglobin: 9.7 g/dL — ABNORMAL LOW (ref 12.0–15.0)
Immature Granulocytes: 0 %
Lymphocytes Relative: 21 %
Lymphs Abs: 1.8 10*3/uL (ref 0.7–4.0)
MCH: 20.3 pg — ABNORMAL LOW (ref 26.0–34.0)
MCHC: 31.8 g/dL (ref 30.0–36.0)
MCV: 63.8 fL — ABNORMAL LOW (ref 80.0–100.0)
Monocytes Absolute: 0.8 10*3/uL (ref 0.1–1.0)
Monocytes Relative: 9 %
Neutro Abs: 5.8 10*3/uL (ref 1.7–7.7)
Neutrophils Relative %: 66 %
Platelets: 404 10*3/uL — ABNORMAL HIGH (ref 150–400)
RBC: 4.78 MIL/uL (ref 3.87–5.11)
RDW: 22 % — ABNORMAL HIGH (ref 11.5–15.5)
Smear Review: NORMAL
WBC: 8.7 10*3/uL (ref 4.0–10.5)
nRBC: 0 % (ref 0.0–0.2)

## 2022-02-05 LAB — IRON AND TIBC
Iron: 31 ug/dL (ref 28–170)
Saturation Ratios: 7 % — ABNORMAL LOW (ref 10.4–31.8)
TIBC: 472 ug/dL — ABNORMAL HIGH (ref 250–450)
UIBC: 441 ug/dL

## 2022-02-05 LAB — FERRITIN: Ferritin: 5 ng/mL — ABNORMAL LOW (ref 11–307)

## 2022-02-11 MED FILL — Iron Sucrose Inj 20 MG/ML (Fe Equiv): INTRAVENOUS | Qty: 10 | Status: AC

## 2022-02-12 ENCOUNTER — Other Ambulatory Visit: Payer: Self-pay

## 2022-02-12 ENCOUNTER — Telehealth: Payer: Self-pay

## 2022-02-12 ENCOUNTER — Inpatient Hospital Stay: Payer: BC Managed Care – PPO

## 2022-02-12 ENCOUNTER — Inpatient Hospital Stay: Payer: BC Managed Care – PPO | Attending: Oncology | Admitting: Oncology

## 2022-02-12 ENCOUNTER — Encounter: Payer: Self-pay | Admitting: Oncology

## 2022-02-12 VITALS — BP 130/102 | HR 85 | Temp 96.8°F | Resp 16 | Wt 253.0 lb

## 2022-02-12 DIAGNOSIS — Z5941 Food insecurity: Secondary | ICD-10-CM | POA: Diagnosis not present

## 2022-02-12 DIAGNOSIS — Z8041 Family history of malignant neoplasm of ovary: Secondary | ICD-10-CM | POA: Insufficient documentation

## 2022-02-12 DIAGNOSIS — Z90721 Acquired absence of ovaries, unilateral: Secondary | ICD-10-CM | POA: Insufficient documentation

## 2022-02-12 DIAGNOSIS — Z803 Family history of malignant neoplasm of breast: Secondary | ICD-10-CM | POA: Insufficient documentation

## 2022-02-12 DIAGNOSIS — Z5986 Financial insecurity: Secondary | ICD-10-CM | POA: Diagnosis not present

## 2022-02-12 DIAGNOSIS — D649 Anemia, unspecified: Secondary | ICD-10-CM

## 2022-02-12 DIAGNOSIS — Z8042 Family history of malignant neoplasm of prostate: Secondary | ICD-10-CM | POA: Insufficient documentation

## 2022-02-12 DIAGNOSIS — R5383 Other fatigue: Secondary | ICD-10-CM | POA: Diagnosis not present

## 2022-02-12 DIAGNOSIS — Z833 Family history of diabetes mellitus: Secondary | ICD-10-CM | POA: Insufficient documentation

## 2022-02-12 DIAGNOSIS — Z8249 Family history of ischemic heart disease and other diseases of the circulatory system: Secondary | ICD-10-CM | POA: Diagnosis not present

## 2022-02-12 DIAGNOSIS — D500 Iron deficiency anemia secondary to blood loss (chronic): Secondary | ICD-10-CM | POA: Diagnosis not present

## 2022-02-12 DIAGNOSIS — N920 Excessive and frequent menstruation with regular cycle: Secondary | ICD-10-CM | POA: Insufficient documentation

## 2022-02-12 DIAGNOSIS — Z800 Family history of malignant neoplasm of digestive organs: Secondary | ICD-10-CM | POA: Insufficient documentation

## 2022-02-12 DIAGNOSIS — D75839 Thrombocytosis, unspecified: Secondary | ICD-10-CM | POA: Diagnosis not present

## 2022-02-12 DIAGNOSIS — Z79899 Other long term (current) drug therapy: Secondary | ICD-10-CM | POA: Insufficient documentation

## 2022-02-12 LAB — PREGNANCY, URINE: Preg Test, Ur: NEGATIVE

## 2022-02-12 NOTE — Addendum Note (Signed)
Addended by: Rickard Patience on: 02/12/2022 09:38 PM   Modules accepted: Orders

## 2022-02-12 NOTE — Progress Notes (Signed)
Hematology/Oncology Progress note [phone] [phone]            Patient Care Team: [facility] as PCP - General  CHIEF COMPLAINTS/REASON FOR VISIT:  Follow up for anemia and thrombocytosis  HISTORY OF PRESENTING ILLNESS:   Kim Duran is a  36 y.o.  female presents for follow up of iron deficiency anemia and thrombocytosis 09/22/2021, patient had a CBC done which showed a hemoglobin of 9.2, MCV 66, platelets 511.  I reviewed patient's previous lab records. Patient has chronic anemia, MCV is chronically decreased. Patient reports heavy menstrual period.  She is not interested in any further intervention.  Denies blood in the stool. Patient is married for 5 children.  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  She feels very fatigued,  She craves ice chips.  INTERVAL HISTORY Kim Duran is a 36 y.o. female who has above history reviewed by me today presents for follow up visit for iron deficincy anemia and thrombocytosis She takes oral iron supplementation.  Still feel fatigued.  + heavy menses.    Review of Systems  Constitutional:  Positive for fatigue. Negative for appetite change, chills and fever.  HENT:   Negative for hearing loss and voice change.   Eyes:  Negative for eye problems.  Respiratory:  Negative for chest tightness and cough.   Cardiovascular:  Negative for chest pain.  Gastrointestinal:  Negative for abdominal distention, abdominal pain and blood in stool.  Endocrine: Negative for hot flashes.  Genitourinary:  Negative for difficulty urinating and frequency.   Musculoskeletal:  Negative for arthralgias.  Skin:  Negative for itching and rash.  Neurological:  Negative for extremity weakness.  Hematological:  Negative for adenopathy.  Psychiatric/Behavioral:  Negative for confusion.     MEDICAL HISTORY:  Past Medical History:  Diagnosis Date   Medical history non-contributory     SURGICAL  HISTORY: Past Surgical History:  Procedure Laterality Date   CESAREAN SECTION     CESAREAN SECTION N/A 01/23/2018   Procedure: CESAREAN SECTION;  Surgeon: Sloan Leiter, MD;  Location: [facility];  Service: Obstetrics;  Laterality: N/A;   left ovary and tube removed  08/16/2009   10lb tumor "wrapped around ovary"     SOCIAL HISTORY: Social History   Socioeconomic History   Marital status: Married    Spouse name: Not on file   Number of children: Not on file   Years of education: Not on file   Highest education level: Not on file  Occupational History   Not on file  Tobacco Use   Smoking status: Never   Smokeless tobacco: Never  Vaping Use   Vaping Use: Never used  Substance and Sexual Activity   Alcohol use: No   Drug use: No   Sexual activity: Yes    Birth control/protection: None  Other Topics Concern   Not on file  Social History Narrative   Not on file   Social Determinants of Health   Financial Resource Strain: Medium Risk (09/22/2021)   Overall Financial Resource Strain (CARDIA)    Difficulty of Paying Living Expenses: Somewhat hard  Food Insecurity: Food Insecurity Present (09/22/2021)   Hunger Vital Sign    Worried About [place] in the Last Year: Sometimes true    Ran Out of Food in the Last Year: Sometimes true  Transportation Needs: No Transportation Needs (09/22/2021)   PRAPARE - Transportation    Lack of Transportation (Medical): No    Lack  of Transportation (Non-Medical): No  Physical Activity: Insufficiently Active (09/22/2021)   Exercise Vital Sign    Days of Exercise per Week: 2 days    Minutes of Exercise per Session: 30 min  Stress: Stress Concern Present (09/22/2021)   [place]    Feeling of Stress : Very much  Social Connections: Socially Integrated (09/22/2021)   Social Connection and Isolation Panel [NHANES]    Frequency of Communication with Friends and  Family: More than three times a week    Frequency of Social Gatherings with Friends and Family: Never    Attends Religious Services: More than 4 times per year    Active Member of [employer] or Organizations: Yes    Attends Archivist Meetings: Never    Marital Status: Married  Human resources officer Violence: Not At Risk (09/22/2021)   Humiliation, Afraid, Rape, and Kick questionnaire    Fear of Current or Ex-Partner: No    Emotionally Abused: No    Physically Abused: No    Sexually Abused: No    FAMILY HISTORY: Family History  Problem Relation Age of Onset   Hypertension Mother    Diabetes Mother        pre-diabetes   Hypertension Father    Cancer Paternal Aunt        breast cancer   Cancer Paternal Aunt        breast   Cancer Maternal Grandmother        liver   Cancer Paternal Grandfather    Cancer Maternal Grandfather        prostate   Premature birth Daughter        born at 57 weeks   Diabetes Paternal Aunt    Cancer Other        breast and ovarian ca. relation unspecified    ALLERGIES:  has No Known Allergies.  MEDICATIONS:  Current Outpatient Medications  Medication Sig Dispense Refill   amLODipine (NORVASC) 5 MG tablet TAKE 1 TABLET BY MOUTH EVERY DAY 90 tablet 3   citalopram (CELEXA) 20 MG tablet TAKE 1 TABLET BY MOUTH EVERY DAY 90 tablet 3   Cyanocobalamin (VITAMIN B-12 PO) Take by mouth.     ferrous sulfate 325 (65 FE) MG EC tablet TAKE 1 TABLET BY MOUTH 2 TIMES DAILY WITH A MEAL. 60 tablet 3   Multiple Vitamin (MULTIVITAMIN) tablet Take 1 tablet by mouth daily.     Pyridoxine HCl (VITAMIN B-6 PO) Take by mouth.     VITAMIN D PO Take by mouth.     No current facility-administered medications for this visit.     PHYSICAL EXAMINATION:  Vitals:   02/12/22 1227  BP: (!) 130/102  Pulse: 85  Resp: 16  Temp: (!) 96.8 F (36 C)  SpO2: 100%   Filed Weights   02/12/22 1227  Weight: 253 lb (114.8 kg)    Physical Exam Constitutional:      General:  She is not in acute distress.    Appearance: She is obese.  HENT:     Head: Normocephalic and atraumatic.  Eyes:     General: No scleral icterus. Cardiovascular:     Rate and Rhythm: Normal rate and regular rhythm.     Heart sounds: Normal heart sounds.  Pulmonary:     Effort: Pulmonary effort is normal. No respiratory distress.     Breath sounds: No wheezing.  Abdominal:     General: Bowel sounds are normal. There is  no distension.     Palpations: Abdomen is soft.  Musculoskeletal:        General: No deformity. Normal range of motion.     Cervical back: Normal range of motion and neck supple.  Skin:    General: Skin is warm and dry.     Findings: No erythema or rash.  Neurological:     Mental Status: She is alert and oriented to person, place, and time. Mental status is at baseline.     Cranial Nerves: No cranial nerve deficit.     Coordination: Coordination normal.  Psychiatric:        Mood and Affect: Mood normal.     LABORATORY DATA:  I have reviewed the data as listed    Latest Ref Rng & Units 02/05/2022    3:18 PM 10/29/2021    4:12 PM 09/22/2021    4:14 PM  CBC  WBC 4.0 - 10.5 K/uL 8.7  8.0  8.8   Hemoglobin 12.0 - 15.0 g/dL 9.7  9.2  9.2   Hematocrit 36.0 - 46.0 % 30.5  30.7  31.1   Platelets 150 - 400 K/uL 404  461  511       Latest Ref Rng & Units 10/29/2021    4:12 PM 09/22/2021    4:14 PM 03/03/2020   10:15 AM  CMP  Glucose 70 - 99 mg/dL 700  85  174   BUN 6 - 20 mg/dL 11  12  11    Creatinine 0.44 - 1.00 mg/dL  9.44  9.67   Sodium 135 - 145 mmol/L 136  138  137   Potassium 3.5 - 5.1 mmol/L 4.0  4.2  4.5   Chloride 98 - 111 mmol/L 105  102  104   CO2 22 - 32 mmol/L 26  23  19    Calcium 8.9 - 10.3 mg/dL 9.1  9.6  9.8   Total Protein 6.5 - 8.1 g/dL 8.4  8.1  8.0   Total Bilirubin 0.3 - 1.2 mg/dL 0.3  5.91  0.3   Alkaline Phos 38 - 126 U/L 63  84  69   AST 15 - 41 U/L 18  15  13    ALT 0 - 44 U/L 16  11  11      Iron/TIBC/Ferritin/ %Sat     Component Value Date/Time   IRON 31 02/05/2022 1518   TIBC 472 (H) 02/05/2022 1518   FERRITIN 5 (L) 02/05/2022 1518   IRONPCTSAT 7 (L) 02/05/2022 1518      RADIOGRAPHIC STUDIES: I have personally reviewed the radiological images as listed and agreed with the findings in the report. No results found.    ASSESSMENT & PLAN:  1. Iron deficiency anemia due to chronic blood loss   2. Thrombocytosis    #iron deficiency anemia.  I discussed about the potential risks including but not limited to allergic reactions/infusion reactions including anaphylactic reactions, phlebitis, high blood pressure, wheezing, SOB, skin rash, weight gain, leg swelling, headache, nausea and fatigue, etc. Patient  desires to achieved higher level of iron faster for adequate hematopoesis. Plan IV venofer weekly x 4 Patient would like to schedule one Venofer next week and decide if she would like to have additional IV venofer treatment.  #Thrombocytosis, likely secondary to iron deficiency. #Heavy menstrual bleed.  Recommend patient to discuss with gynecology for management plan.  Follow-up in 4 months for evaluation of treatment response.  Orders Placed This Encounter  Procedures   CBC with Differential/Platelet  Standing Status:   Future    Standing Expiration Date:   02/13/2023   Ferritin    Standing Status:   Future    Standing Expiration Date:   02/13/2023   Iron and TIBC    Standing Status:   Future    Standing Expiration Date:   02/13/2023   Pregnancy, urine    Standing Status:   Future    Standing Expiration Date:   02/13/2023    All questions were answered. The patient knows to call the clinic with any problems questions or concerns.  cc [facility]   Rickard Patience, MD, PhD [facility] Hematology Oncology 02/12/2022

## 2022-02-12 NOTE — Telephone Encounter (Signed)
Patient will come on for Venofer next week. If infusion goes well she will proceed with scheduling additional venofer weekly x3.

## 2022-02-17 ENCOUNTER — Inpatient Hospital Stay: Payer: BC Managed Care – PPO

## 2022-02-17 VITALS — BP 137/90 | HR 89 | Temp 96.2°F | Resp 18

## 2022-02-17 DIAGNOSIS — R5383 Other fatigue: Secondary | ICD-10-CM | POA: Diagnosis not present

## 2022-02-17 DIAGNOSIS — N920 Excessive and frequent menstruation with regular cycle: Secondary | ICD-10-CM | POA: Diagnosis not present

## 2022-02-17 DIAGNOSIS — Z8041 Family history of malignant neoplasm of ovary: Secondary | ICD-10-CM | POA: Diagnosis not present

## 2022-02-17 DIAGNOSIS — Z833 Family history of diabetes mellitus: Secondary | ICD-10-CM | POA: Diagnosis not present

## 2022-02-17 DIAGNOSIS — Z8249 Family history of ischemic heart disease and other diseases of the circulatory system: Secondary | ICD-10-CM | POA: Diagnosis not present

## 2022-02-17 DIAGNOSIS — Z800 Family history of malignant neoplasm of digestive organs: Secondary | ICD-10-CM | POA: Diagnosis not present

## 2022-02-17 DIAGNOSIS — D75839 Thrombocytosis, unspecified: Secondary | ICD-10-CM | POA: Diagnosis not present

## 2022-02-17 DIAGNOSIS — Z90721 Acquired absence of ovaries, unilateral: Secondary | ICD-10-CM | POA: Diagnosis not present

## 2022-02-17 DIAGNOSIS — Z8042 Family history of malignant neoplasm of prostate: Secondary | ICD-10-CM | POA: Diagnosis not present

## 2022-02-17 DIAGNOSIS — D500 Iron deficiency anemia secondary to blood loss (chronic): Secondary | ICD-10-CM | POA: Diagnosis not present

## 2022-02-17 DIAGNOSIS — Z5986 Financial insecurity: Secondary | ICD-10-CM | POA: Diagnosis not present

## 2022-02-17 DIAGNOSIS — Z803 Family history of malignant neoplasm of breast: Secondary | ICD-10-CM | POA: Diagnosis not present

## 2022-02-17 DIAGNOSIS — Z5941 Food insecurity: Secondary | ICD-10-CM | POA: Diagnosis not present

## 2022-02-17 DIAGNOSIS — Z79899 Other long term (current) drug therapy: Secondary | ICD-10-CM | POA: Diagnosis not present

## 2022-02-17 MED ORDER — SODIUM CHLORIDE 0.9 % IV SOLN
Freq: Once | INTRAVENOUS | Status: AC
  Filled 2022-02-17: qty 250

## 2022-02-17 MED ORDER — SODIUM CHLORIDE 0.9 % IV SOLN
200.0000 mg | Freq: Once | INTRAVENOUS | Status: AC
  Administered 2022-02-17: 200 mg via INTRAVENOUS
  Filled 2022-02-17: qty 200

## 2022-02-17 NOTE — Patient Instructions (Signed)
[facility]  Discharge Instructions: Thank you for choosing [facility] to provide your oncology and hematology care.  If you have a lab appointment with the Cancer Center, please go directly to the Cancer Center and check in at the registration area.  Wear comfortable clothing and clothing appropriate for easy access to any Portacath or PICC line.   We strive to give you quality time with your provider. You may need to reschedule your appointment if you arrive late (15 or more minutes).  Arriving late affects you and other patients whose appointments are after yours.  Also, if you miss three or more appointments without notifying the office, you may be dismissed from the clinic at the provider's discretion.      For prescription refill requests, have your pharmacy contact our office and allow 72 hours for refills to be completed.    Today you received the following chemotherapy and/or immunotherapy agents VENOFER      To help prevent nausea and vomiting after your treatment, we encourage you to take your nausea medication as directed.  BELOW ARE SYMPTOMS THAT SHOULD BE REPORTED IMMEDIATELY: *FEVER GREATER THAN 100.4 F (38 C) OR HIGHER *CHILLS OR SWEATING *NAUSEA AND VOMITING THAT IS NOT CONTROLLED WITH YOUR NAUSEA MEDICATION *UNUSUAL SHORTNESS OF BREATH *UNUSUAL BRUISING OR BLEEDING *URINARY PROBLEMS (pain or burning when urinating, or frequent urination) *BOWEL PROBLEMS (unusual diarrhea, constipation, pain near the anus) TENDERNESS IN MOUTH AND THROAT WITH OR WITHOUT PRESENCE OF ULCERS (sore throat, sores in mouth, or a toothache) UNUSUAL RASH, SWELLING OR PAIN  UNUSUAL VAGINAL DISCHARGE OR ITCHING   Items with * indicate a potential emergency and should be followed up as soon as possible or go to the Emergency Department if any problems should occur.  Please show the CHEMOTHERAPY ALERT CARD or IMMUNOTHERAPY ALERT CARD at check-in to the  Emergency Department and triage nurse.  Should you have questions after your visit or need to cancel or reschedule your appointment, please contact [facility]  [phone] and follow the prompts.  Office hours are 8:00 a.m. to 4:30 p.m. Monday - Friday. Please note that voicemails left after 4:00 p.m. may not be returned until the following business day.  We are closed weekends and major holidays. You have access to a nurse at all times for urgent questions. Please call the main number to the clinic [phone] and follow the prompts.  For any non-urgent questions, you may also contact your provider using MyChart. We now offer e-Visits for anyone 18 and older to request care online for non-urgent symptoms. For details visit [email or web address].   Also download the MyChart app! Go to the app store, search "MyChart", open the app, select [facility], and log in with your MyChart username and password.  Masks are optional in the cancer centers. If you would like for your care team to wear a mask while they are taking care of you, please let them know. For doctor visits, patients may have with them one support person who is at least 36 years old. At this time, visitors are not allowed in the infusion area.  Iron Sucrose Injection What is this medication? IRON SUCROSE (EYE ern SOO krose) treats low levels of iron (iron deficiency anemia) in people with kidney disease. Iron is a mineral that plays an important role in making red blood cells, which carry oxygen from your lungs to the rest of your body. This medicine may be   used for other purposes; ask your health care provider or pharmacist if you have questions. COMMON BRAND NAME(S): Venofer What should I tell my care team before I take this medication? They need to know if you have any of these conditions: Anemia not caused by low iron levels Heart disease High levels of iron in the blood Kidney disease Liver  disease An unusual or allergic reaction to iron, other medications, foods, dyes, or preservatives Pregnant or trying to get pregnant Breast-feeding How should I use this medication? This medication is for infusion into a vein. It is given in a hospital or clinic setting. Talk to your care team about the use of this medication in children. While this medication may be prescribed for children as young as 2 years for selected conditions, precautions do apply. Overdosage: If you think you have taken too much of this medicine contact a poison control center or emergency room at once. NOTE: This medicine is only for you. Do not share this medicine with others. What if I miss a dose? It is important not to miss your dose. Call your care team if you are unable to keep an appointment. What may interact with this medication? Do not take this medication with any of the following: Deferoxamine Dimercaprol Other iron products This medication may also interact with the following: Chloramphenicol Deferasirox This list may not describe all possible interactions. Give your health care provider a list of all the medicines, herbs, non-prescription drugs, or dietary supplements you use. Also tell them if you smoke, drink alcohol, or use illegal drugs. Some items may interact with your medicine. What should I watch for while using this medication? Visit your care team regularly. Tell your care team if your symptoms do not start to get better or if they get worse. You may need blood work done while you are taking this medication. You may need to follow a special diet. Talk to your care team. Foods that contain iron include: whole grains/cereals, dried fruits, beans, or peas, leafy green vegetables, and organ meats (liver, kidney). What side effects may I notice from receiving this medication? Side effects that you should report to your care team as soon as possible: Allergic reactions--skin rash, itching, hives,  swelling of the face, lips, tongue, or throat Low blood pressure--dizziness, feeling faint or lightheaded, blurry vision Shortness of breath Side effects that usually do not require medical attention (report to your care team if they continue or are bothersome): Flushing Headache Joint pain Muscle pain Nausea Pain, redness, or irritation at injection site This list may not describe all possible side effects. Call your doctor for medical advice about side effects. You may report side effects to FDA at 1-800-FDA-1088. Where should I keep my medication? This medication is given in a hospital or clinic and will not be stored at home. NOTE: This sheet is a summary. It may not cover all possible information. If you have questions about this medicine, talk to your doctor, pharmacist, or health care provider.  2023 Elsevier/Gold Standard (2007-08-19 00:00:00)   

## 2022-02-18 ENCOUNTER — Encounter: Payer: Self-pay | Admitting: Oncology

## 2022-02-18 NOTE — Telephone Encounter (Signed)
Per Mychart message form patient, infusion went well and would like to schedule future infusions. Per patient request would like to have next one scheduled for Monday and then Friday. Per Dr. Cathie Hoops ok to schedule as long as they are 3 days apart.    Roderic Palau, please schedule patient for: Iron infusion on Monday and then one on Friday. Please notify patient of appt. Thanks

## 2022-02-22 ENCOUNTER — Encounter: Payer: Self-pay | Admitting: Oncology

## 2022-02-22 ENCOUNTER — Inpatient Hospital Stay: Payer: BC Managed Care – PPO

## 2022-02-22 VITALS — BP 111/85 | HR 82 | Temp 97.8°F | Resp 18

## 2022-02-22 DIAGNOSIS — Z8042 Family history of malignant neoplasm of prostate: Secondary | ICD-10-CM | POA: Diagnosis not present

## 2022-02-22 DIAGNOSIS — Z5941 Food insecurity: Secondary | ICD-10-CM | POA: Diagnosis not present

## 2022-02-22 DIAGNOSIS — D500 Iron deficiency anemia secondary to blood loss (chronic): Secondary | ICD-10-CM

## 2022-02-22 DIAGNOSIS — N920 Excessive and frequent menstruation with regular cycle: Secondary | ICD-10-CM | POA: Diagnosis not present

## 2022-02-22 DIAGNOSIS — Z5986 Financial insecurity: Secondary | ICD-10-CM | POA: Diagnosis not present

## 2022-02-22 DIAGNOSIS — Z803 Family history of malignant neoplasm of breast: Secondary | ICD-10-CM | POA: Diagnosis not present

## 2022-02-22 DIAGNOSIS — Z90721 Acquired absence of ovaries, unilateral: Secondary | ICD-10-CM | POA: Diagnosis not present

## 2022-02-22 DIAGNOSIS — R5383 Other fatigue: Secondary | ICD-10-CM | POA: Diagnosis not present

## 2022-02-22 DIAGNOSIS — Z79899 Other long term (current) drug therapy: Secondary | ICD-10-CM | POA: Diagnosis not present

## 2022-02-22 DIAGNOSIS — D75839 Thrombocytosis, unspecified: Secondary | ICD-10-CM | POA: Diagnosis not present

## 2022-02-22 DIAGNOSIS — Z8249 Family history of ischemic heart disease and other diseases of the circulatory system: Secondary | ICD-10-CM | POA: Diagnosis not present

## 2022-02-22 DIAGNOSIS — Z8041 Family history of malignant neoplasm of ovary: Secondary | ICD-10-CM | POA: Diagnosis not present

## 2022-02-22 DIAGNOSIS — Z800 Family history of malignant neoplasm of digestive organs: Secondary | ICD-10-CM | POA: Diagnosis not present

## 2022-02-22 DIAGNOSIS — Z833 Family history of diabetes mellitus: Secondary | ICD-10-CM | POA: Diagnosis not present

## 2022-02-22 MED ORDER — SODIUM CHLORIDE 0.9 % IV SOLN
Freq: Once | INTRAVENOUS | Status: AC
  Filled 2022-02-22: qty 250

## 2022-02-22 MED ORDER — SODIUM CHLORIDE 0.9 % IV SOLN
200.0000 mg | Freq: Once | INTRAVENOUS | Status: AC
  Administered 2022-02-22: 200 mg via INTRAVENOUS
  Filled 2022-02-22: qty 200

## 2022-02-26 ENCOUNTER — Inpatient Hospital Stay: Payer: BC Managed Care – PPO

## 2022-02-26 VITALS — BP 148/89 | HR 82 | Temp 98.0°F | Resp 18

## 2022-02-26 DIAGNOSIS — Z800 Family history of malignant neoplasm of digestive organs: Secondary | ICD-10-CM | POA: Diagnosis not present

## 2022-02-26 DIAGNOSIS — D500 Iron deficiency anemia secondary to blood loss (chronic): Secondary | ICD-10-CM

## 2022-02-26 DIAGNOSIS — Z8249 Family history of ischemic heart disease and other diseases of the circulatory system: Secondary | ICD-10-CM | POA: Diagnosis not present

## 2022-02-26 DIAGNOSIS — Z803 Family history of malignant neoplasm of breast: Secondary | ICD-10-CM | POA: Diagnosis not present

## 2022-02-26 DIAGNOSIS — Z833 Family history of diabetes mellitus: Secondary | ICD-10-CM | POA: Diagnosis not present

## 2022-02-26 DIAGNOSIS — Z8041 Family history of malignant neoplasm of ovary: Secondary | ICD-10-CM | POA: Diagnosis not present

## 2022-02-26 DIAGNOSIS — Z5986 Financial insecurity: Secondary | ICD-10-CM | POA: Diagnosis not present

## 2022-02-26 DIAGNOSIS — N920 Excessive and frequent menstruation with regular cycle: Secondary | ICD-10-CM | POA: Diagnosis not present

## 2022-02-26 DIAGNOSIS — Z5941 Food insecurity: Secondary | ICD-10-CM | POA: Diagnosis not present

## 2022-02-26 DIAGNOSIS — R5383 Other fatigue: Secondary | ICD-10-CM | POA: Diagnosis not present

## 2022-02-26 DIAGNOSIS — Z90721 Acquired absence of ovaries, unilateral: Secondary | ICD-10-CM | POA: Diagnosis not present

## 2022-02-26 DIAGNOSIS — D75839 Thrombocytosis, unspecified: Secondary | ICD-10-CM | POA: Diagnosis not present

## 2022-02-26 DIAGNOSIS — Z79899 Other long term (current) drug therapy: Secondary | ICD-10-CM | POA: Diagnosis not present

## 2022-02-26 DIAGNOSIS — Z8042 Family history of malignant neoplasm of prostate: Secondary | ICD-10-CM | POA: Diagnosis not present

## 2022-02-26 MED ORDER — SODIUM CHLORIDE 0.9 % IV SOLN
Freq: Once | INTRAVENOUS | Status: AC
  Filled 2022-02-26: qty 250

## 2022-02-26 MED ORDER — SODIUM CHLORIDE 0.9 % IV SOLN
200.0000 mg | Freq: Once | INTRAVENOUS | Status: AC
  Administered 2022-02-26: 200 mg via INTRAVENOUS
  Filled 2022-02-26: qty 200

## 2022-02-26 NOTE — Patient Instructions (Signed)
[facility]  Discharge Instructions: Thank you for choosing [facility] to provide your oncology and hematology care.  If you have a lab appointment with the Cancer Center, please go directly to the Cancer Center and check in at the registration area.  Wear comfortable clothing and clothing appropriate for easy access to any Portacath or PICC line.   We strive to give you quality time with your provider. You may need to reschedule your appointment if you arrive late (15 or more minutes).  Arriving late affects you and other patients whose appointments are after yours.  Also, if you miss three or more appointments without notifying the office, you may be dismissed from the clinic at the provider's discretion.      For prescription refill requests, have your pharmacy contact our office and allow 72 hours for refills to be completed.    Today you received the following chemotherapy and/or immunotherapy agents VENOFER      To help prevent nausea and vomiting after your treatment, we encourage you to take your nausea medication as directed.  BELOW ARE SYMPTOMS THAT SHOULD BE REPORTED IMMEDIATELY: *FEVER GREATER THAN 100.4 F (38 C) OR HIGHER *CHILLS OR SWEATING *NAUSEA AND VOMITING THAT IS NOT CONTROLLED WITH YOUR NAUSEA MEDICATION *UNUSUAL SHORTNESS OF BREATH *UNUSUAL BRUISING OR BLEEDING *URINARY PROBLEMS (pain or burning when urinating, or frequent urination) *BOWEL PROBLEMS (unusual diarrhea, constipation, pain near the anus) TENDERNESS IN MOUTH AND THROAT WITH OR WITHOUT PRESENCE OF ULCERS (sore throat, sores in mouth, or a toothache) UNUSUAL RASH, SWELLING OR PAIN  UNUSUAL VAGINAL DISCHARGE OR ITCHING   Items with * indicate a potential emergency and should be followed up as soon as possible or go to the Emergency Department if any problems should occur.  Please show the CHEMOTHERAPY ALERT CARD or IMMUNOTHERAPY ALERT CARD at check-in to the  Emergency Department and triage nurse.  Should you have questions after your visit or need to cancel or reschedule your appointment, please contact [facility]  [phone] and follow the prompts.  Office hours are 8:00 a.m. to 4:30 p.m. Monday - Friday. Please note that voicemails left after 4:00 p.m. may not be returned until the following business day.  We are closed weekends and major holidays. You have access to a nurse at all times for urgent questions. Please call the main number to the clinic [phone] and follow the prompts.  For any non-urgent questions, you may also contact your provider using MyChart. We now offer e-Visits for anyone 18 and older to request care online for non-urgent symptoms. For details visit [email or web address].   Also download the MyChart app! Go to the app store, search "MyChart", open the app, select [facility], and log in with your MyChart username and password.  Masks are optional in the cancer centers. If you would like for your care team to wear a mask while they are taking care of you, please let them know. For doctor visits, patients may have with them one support person who is at least 36 years old. At this time, visitors are not allowed in the infusion area.  Iron Sucrose Injection What is this medication? IRON SUCROSE (EYE ern SOO krose) treats low levels of iron (iron deficiency anemia) in people with kidney disease. Iron is a mineral that plays an important role in making red blood cells, which carry oxygen from your lungs to the rest of your body. This medicine may be   used for other purposes; ask your health care provider or pharmacist if you have questions. COMMON BRAND NAME(S): Venofer What should I tell my care team before I take this medication? They need to know if you have any of these conditions: Anemia not caused by low iron levels Heart disease High levels of iron in the blood Kidney disease Liver  disease An unusual or allergic reaction to iron, other medications, foods, dyes, or preservatives Pregnant or trying to get pregnant Breast-feeding How should I use this medication? This medication is for infusion into a vein. It is given in a hospital or clinic setting. Talk to your care team about the use of this medication in children. While this medication may be prescribed for children as young as 2 years for selected conditions, precautions do apply. Overdosage: If you think you have taken too much of this medicine contact a poison control center or emergency room at once. NOTE: This medicine is only for you. Do not share this medicine with others. What if I miss a dose? It is important not to miss your dose. Call your care team if you are unable to keep an appointment. What may interact with this medication? Do not take this medication with any of the following: Deferoxamine Dimercaprol Other iron products This medication may also interact with the following: Chloramphenicol Deferasirox This list may not describe all possible interactions. Give your health care provider a list of all the medicines, herbs, non-prescription drugs, or dietary supplements you use. Also tell them if you smoke, drink alcohol, or use illegal drugs. Some items may interact with your medicine. What should I watch for while using this medication? Visit your care team regularly. Tell your care team if your symptoms do not start to get better or if they get worse. You may need blood work done while you are taking this medication. You may need to follow a special diet. Talk to your care team. Foods that contain iron include: whole grains/cereals, dried fruits, beans, or peas, leafy green vegetables, and organ meats (liver, kidney). What side effects may I notice from receiving this medication? Side effects that you should report to your care team as soon as possible: Allergic reactions--skin rash, itching, hives,  swelling of the face, lips, tongue, or throat Low blood pressure--dizziness, feeling faint or lightheaded, blurry vision Shortness of breath Side effects that usually do not require medical attention (report to your care team if they continue or are bothersome): Flushing Headache Joint pain Muscle pain Nausea Pain, redness, or irritation at injection site This list may not describe all possible side effects. Call your doctor for medical advice about side effects. You may report side effects to FDA at 1-800-FDA-1088. Where should I keep my medication? This medication is given in a hospital or clinic and will not be stored at home. NOTE: This sheet is a summary. It may not cover all possible information. If you have questions about this medicine, talk to your doctor, pharmacist, or health care provider.  2023 Elsevier/Gold Standard (2007-08-19 00:00:00)   

## 2022-06-07 ENCOUNTER — Encounter: Payer: Self-pay | Admitting: Oncology

## 2022-06-11 ENCOUNTER — Encounter: Payer: Self-pay | Admitting: Oncology

## 2022-06-14 ENCOUNTER — Inpatient Hospital Stay: Payer: Medicaid Other | Attending: Oncology

## 2022-06-14 DIAGNOSIS — Z5941 Food insecurity: Secondary | ICD-10-CM | POA: Insufficient documentation

## 2022-06-14 DIAGNOSIS — D509 Iron deficiency anemia, unspecified: Secondary | ICD-10-CM | POA: Insufficient documentation

## 2022-06-14 DIAGNOSIS — Z8041 Family history of malignant neoplasm of ovary: Secondary | ICD-10-CM | POA: Insufficient documentation

## 2022-06-14 DIAGNOSIS — Z8249 Family history of ischemic heart disease and other diseases of the circulatory system: Secondary | ICD-10-CM | POA: Insufficient documentation

## 2022-06-14 DIAGNOSIS — Z79899 Other long term (current) drug therapy: Secondary | ICD-10-CM | POA: Insufficient documentation

## 2022-06-14 DIAGNOSIS — Z833 Family history of diabetes mellitus: Secondary | ICD-10-CM | POA: Insufficient documentation

## 2022-06-14 DIAGNOSIS — Z90721 Acquired absence of ovaries, unilateral: Secondary | ICD-10-CM | POA: Insufficient documentation

## 2022-06-14 DIAGNOSIS — R5383 Other fatigue: Secondary | ICD-10-CM | POA: Insufficient documentation

## 2022-06-14 DIAGNOSIS — D75839 Thrombocytosis, unspecified: Secondary | ICD-10-CM | POA: Insufficient documentation

## 2022-06-14 DIAGNOSIS — N920 Excessive and frequent menstruation with regular cycle: Secondary | ICD-10-CM | POA: Insufficient documentation

## 2022-06-14 DIAGNOSIS — Z808 Family history of malignant neoplasm of other organs or systems: Secondary | ICD-10-CM | POA: Insufficient documentation

## 2022-06-14 DIAGNOSIS — Z803 Family history of malignant neoplasm of breast: Secondary | ICD-10-CM | POA: Insufficient documentation

## 2022-06-14 DIAGNOSIS — Z5986 Financial insecurity: Secondary | ICD-10-CM | POA: Insufficient documentation

## 2022-06-14 DIAGNOSIS — Z8042 Family history of malignant neoplasm of prostate: Secondary | ICD-10-CM | POA: Insufficient documentation

## 2022-06-14 MED FILL — Iron Sucrose Inj 20 MG/ML (Fe Equiv): INTRAVENOUS | Qty: 10 | Status: AC

## 2022-06-15 ENCOUNTER — Inpatient Hospital Stay: Payer: Medicaid Other

## 2022-06-15 ENCOUNTER — Inpatient Hospital Stay: Payer: Medicaid Other | Admitting: Oncology

## 2022-06-15 ENCOUNTER — Encounter: Payer: Self-pay | Admitting: Oncology

## 2022-06-25 ENCOUNTER — Inpatient Hospital Stay: Payer: Medicaid Other

## 2022-06-25 DIAGNOSIS — D500 Iron deficiency anemia secondary to blood loss (chronic): Secondary | ICD-10-CM

## 2022-06-25 DIAGNOSIS — D509 Iron deficiency anemia, unspecified: Secondary | ICD-10-CM | POA: Diagnosis present

## 2022-06-25 DIAGNOSIS — Z5986 Financial insecurity: Secondary | ICD-10-CM | POA: Diagnosis not present

## 2022-06-25 DIAGNOSIS — R5383 Other fatigue: Secondary | ICD-10-CM | POA: Diagnosis not present

## 2022-06-25 DIAGNOSIS — Z8041 Family history of malignant neoplasm of ovary: Secondary | ICD-10-CM | POA: Diagnosis not present

## 2022-06-25 DIAGNOSIS — Z833 Family history of diabetes mellitus: Secondary | ICD-10-CM | POA: Diagnosis not present

## 2022-06-25 DIAGNOSIS — Z5941 Food insecurity: Secondary | ICD-10-CM | POA: Diagnosis not present

## 2022-06-25 DIAGNOSIS — Z8249 Family history of ischemic heart disease and other diseases of the circulatory system: Secondary | ICD-10-CM | POA: Diagnosis not present

## 2022-06-25 DIAGNOSIS — Z8042 Family history of malignant neoplasm of prostate: Secondary | ICD-10-CM | POA: Diagnosis not present

## 2022-06-25 DIAGNOSIS — D75839 Thrombocytosis, unspecified: Secondary | ICD-10-CM | POA: Diagnosis not present

## 2022-06-25 DIAGNOSIS — Z90721 Acquired absence of ovaries, unilateral: Secondary | ICD-10-CM | POA: Diagnosis not present

## 2022-06-25 DIAGNOSIS — Z79899 Other long term (current) drug therapy: Secondary | ICD-10-CM | POA: Diagnosis not present

## 2022-06-25 DIAGNOSIS — Z803 Family history of malignant neoplasm of breast: Secondary | ICD-10-CM | POA: Diagnosis not present

## 2022-06-25 DIAGNOSIS — N920 Excessive and frequent menstruation with regular cycle: Secondary | ICD-10-CM | POA: Diagnosis not present

## 2022-06-25 DIAGNOSIS — Z808 Family history of malignant neoplasm of other organs or systems: Secondary | ICD-10-CM | POA: Diagnosis not present

## 2022-06-25 LAB — CBC WITH DIFFERENTIAL/PLATELET
Abs Immature Granulocytes: 0.02 10*3/uL (ref 0.00–0.07)
Basophils Absolute: 0 10*3/uL (ref 0.0–0.1)
Basophils Relative: 1 %
Eosinophils Absolute: 0.2 10*3/uL (ref 0.0–0.5)
Eosinophils Relative: 3 %
HCT: 33.1 % — ABNORMAL LOW (ref 36.0–46.0)
Hemoglobin: 10.7 g/dL — ABNORMAL LOW (ref 12.0–15.0)
Immature Granulocytes: 0 %
Lymphocytes Relative: 27 %
Lymphs Abs: 1.5 10*3/uL (ref 0.7–4.0)
MCH: 22.5 pg — ABNORMAL LOW (ref 26.0–34.0)
MCHC: 32.3 g/dL (ref 30.0–36.0)
MCV: 69.7 fL — ABNORMAL LOW (ref 80.0–100.0)
Monocytes Absolute: 0.6 10*3/uL (ref 0.1–1.0)
Monocytes Relative: 10 %
Neutro Abs: 3.3 10*3/uL (ref 1.7–7.7)
Neutrophils Relative %: 59 %
Platelets: 421 10*3/uL — ABNORMAL HIGH (ref 150–400)
RBC: 4.75 MIL/uL (ref 3.87–5.11)
RDW: 18.1 % — ABNORMAL HIGH (ref 11.5–15.5)
WBC: 5.6 10*3/uL (ref 4.0–10.5)
nRBC: 0 % (ref 0.0–0.2)

## 2022-06-25 LAB — IRON AND TIBC
Iron: 29 ug/dL (ref 28–170)
Saturation Ratios: 7 % — ABNORMAL LOW (ref 10.4–31.8)
TIBC: 449 ug/dL (ref 250–450)
UIBC: 420 ug/dL

## 2022-06-25 LAB — FERRITIN: Ferritin: 5 ng/mL — ABNORMAL LOW (ref 11–307)

## 2022-06-30 ENCOUNTER — Encounter: Payer: Self-pay | Admitting: Oncology

## 2022-06-30 ENCOUNTER — Inpatient Hospital Stay: Payer: Medicaid Other

## 2022-06-30 ENCOUNTER — Inpatient Hospital Stay (HOSPITAL_BASED_OUTPATIENT_CLINIC_OR_DEPARTMENT_OTHER): Payer: Medicaid Other | Admitting: Oncology

## 2022-06-30 VITALS — BP 150/89 | HR 77 | Resp 16

## 2022-06-30 VITALS — BP 137/97 | HR 83 | Temp 96.2°F | Wt 273.4 lb

## 2022-06-30 DIAGNOSIS — D75839 Thrombocytosis, unspecified: Secondary | ICD-10-CM

## 2022-06-30 DIAGNOSIS — N920 Excessive and frequent menstruation with regular cycle: Secondary | ICD-10-CM | POA: Diagnosis not present

## 2022-06-30 DIAGNOSIS — Z809 Family history of malignant neoplasm, unspecified: Secondary | ICD-10-CM | POA: Diagnosis not present

## 2022-06-30 DIAGNOSIS — D509 Iron deficiency anemia, unspecified: Secondary | ICD-10-CM | POA: Diagnosis not present

## 2022-06-30 DIAGNOSIS — D500 Iron deficiency anemia secondary to blood loss (chronic): Secondary | ICD-10-CM

## 2022-06-30 MED ORDER — SODIUM CHLORIDE 0.9 % IV SOLN
200.0000 mg | Freq: Once | INTRAVENOUS | Status: AC
  Administered 2022-06-30: 200 mg via INTRAVENOUS
  Filled 2022-06-30: qty 200

## 2022-06-30 MED ORDER — SODIUM CHLORIDE 0.9 % IV SOLN
Freq: Once | INTRAVENOUS | Status: AC
  Filled 2022-06-30: qty 250

## 2022-06-30 MED ORDER — POLYSACCHARIDE IRON COMPLEX 150 MG PO CAPS: 150.0000 mg | ORAL_CAPSULE | Freq: Every day | ORAL | 3 refills | Status: DC

## 2022-07-02 ENCOUNTER — Encounter: Payer: Self-pay | Admitting: Oncology

## 2022-07-02 DIAGNOSIS — D75839 Thrombocytosis, unspecified: Secondary | ICD-10-CM | POA: Insufficient documentation

## 2022-07-02 DIAGNOSIS — Z809 Family history of malignant neoplasm, unspecified: Secondary | ICD-10-CM | POA: Insufficient documentation

## 2022-07-02 DIAGNOSIS — N920 Excessive and frequent menstruation with regular cycle: Secondary | ICD-10-CM | POA: Insufficient documentation

## 2022-07-02 NOTE — Progress Notes (Signed)
Hematology/Oncology Progress note [phone]) [phone] Fax:([phone]            Patient Care Team: [facility] as PCP - General  CHIEF COMPLAINTS/REASON FOR VISIT:  Follow up for anemia and thrombocytosis   ASSESSMENT & PLAN:   IDA (iron deficiency anemia) Labs reviewed and discussed with patient. Hemoglobin and iron panel have improved.  Still iron deficiency. Recommend additional IV Venofer weekly x 2.  Patient declined pregnancy testing.  Family history of cancer Refer to genetic counselor.  Thrombocytosis Likely reactive due to iron deficiency.  Continue monitor.  Menorrhagia Recommend patient to discuss with gynecology for evaluation and management.  Orders Placed This Encounter  Procedures   CBC with Differential/Platelet    Standing Status:   Future    Standing Expiration Date:   06/30/2023   Iron and TIBC([facility])    Standing Status:   Future    Standing Expiration Date:   07/01/2023   Ferritin    Standing Status:   Future    Standing Expiration Date:   07/01/2023   Retic Panel    Standing Status:   Future    Standing Expiration Date:   07/01/2023   Ambulatory referral to Genetics    Referral Priority:   Routine    Referral Type:   Consultation    Referral Reason:   Specialty Services Required    Referred to Provider:   Lacy Duverney T    Number of Visits Requested:   1   Follow-up in 4 months. All questions were answered. The patient knows to call the clinic with any problems, questions or concerns.  Rickard Patience, MD, PhD [facility] Hematology Oncology 06/30/2022   HISTORY OF PRESENTING ILLNESS:   Kim Duran is a  36 y.o.  female presents for follow up of iron deficiency anemia and thrombocytosis 09/22/2021, patient had a CBC done which showed a hemoglobin of 9.2, MCV 66, platelets 511.  I reviewed patient's previous lab records. Patient has chronic anemia, MCV is chronically  decreased. Patient reports heavy menstrual period.  She is not interested in any further intervention.  Denies blood in the stool. Patient is married for 5 children.  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  She feels very fatigued,  She craves ice chips.  INTERVAL HISTORY Kim Duran is a 36 y.o. female who has above history reviewed by me today presents for follow up visit for iron deficincy anemia and thrombocytosis Patient tolerates IV Venofer treatments. Heavy menstrual bleeding.   Review of Systems  Constitutional:  Positive for fatigue. Negative for appetite change, chills and fever.  HENT:   Negative for hearing loss and voice change.   Eyes:  Negative for eye problems.  Respiratory:  Negative for chest tightness and cough.   Cardiovascular:  Negative for chest pain.  Gastrointestinal:  Negative for abdominal distention, abdominal pain and blood in stool.  Endocrine: Negative for hot flashes.  Genitourinary:  Negative for difficulty urinating and frequency.   Musculoskeletal:  Negative for arthralgias.  Skin:  Negative for itching and rash.  Neurological:  Negative for extremity weakness.  Hematological:  Negative for adenopathy.  Psychiatric/Behavioral:  Negative for confusion.     MEDICAL HISTORY:  Past Medical History:  Diagnosis Date   Medical history non-contributory     SURGICAL HISTORY: Past Surgical History:  Procedure Laterality Date   CESAREAN SECTION     CESAREAN SECTION N/A 01/23/2018   Procedure: CESAREAN SECTION;  Surgeon: Conan Bowens, MD;  Location: [facility];  Service: Obstetrics;  Laterality: N/A;   left ovary and tube removed  08/16/2009   10lb tumor "wrapped around ovary"     SOCIAL HISTORY: Social History   Socioeconomic History   Marital status: Married    Spouse name: Not on file   Number of children: Not on file   Years of education: Not on file   Highest education level: Not on file  Occupational History   Not on file   Tobacco Use   Smoking status: Never   Smokeless tobacco: Never  Vaping Use   Vaping Use: Never used  Substance and Sexual Activity   Alcohol use: No   Drug use: No   Sexual activity: Yes    Birth control/protection: None  Other Topics Concern   Not on file  Social History Narrative   Not on file   Social Determinants of Health   Financial Resource Strain: Medium Risk (09/22/2021)   Overall Financial Resource Strain (CARDIA)    Difficulty of Paying Living Expenses: Somewhat hard  Food Insecurity: Food Insecurity Present (09/22/2021)   Hunger Vital Sign    Worried About Running Out of Food in the Last Year: Sometimes true    Ran Out of Food in the Last Year: Sometimes true  Transportation Needs: No Transportation Needs (09/22/2021)   PRAPARE - Administrator, Civil Service (Medical): No    Lack of Transportation (Non-Medical): No  Physical Activity: Insufficiently Active (09/22/2021)   Exercise Vital Sign    Days of Exercise per Week: 2 days    Minutes of Exercise per Session: 30 min  Stress: Stress Concern Present (09/22/2021)   [employer] of Occupational Health - Occupational Stress Questionnaire    Feeling of Stress : Very much  Social Connections: Socially Integrated (09/22/2021)   Social Connection and Isolation Panel [NHANES]    Frequency of Communication with Friends and Family: More than three times a week    Frequency of Social Gatherings with Friends and Family: Never    Attends Religious Services: More than 4 times per year    Active Member of [employer] or Organizations: Yes    Attends Banker Meetings: Never    Marital Status: Married  Catering manager Violence: Not At Risk (09/22/2021)   Humiliation, Afraid, Rape, and Kick questionnaire    Fear of Current or Ex-Partner: No    Emotionally Abused: No    Physically Abused: No    Sexually Abused: No    FAMILY HISTORY: Family History  Problem Relation Age of Onset   Hypertension Mother     Diabetes Mother        pre-diabetes   Hypertension Father    Cancer Paternal Aunt        breast cancer   Cancer Paternal Aunt        breast   Cancer Maternal Grandmother        liver   Cancer Paternal Grandfather    Cancer Maternal Grandfather        prostate   Premature birth Daughter        born at 24 weeks   Diabetes Paternal Aunt    Cancer Other        breast and ovarian ca. relation unspecified    ALLERGIES:  has No Known Allergies.  MEDICATIONS:  Current Outpatient Medications  Medication Sig Dispense Refill   amLODipine (NORVASC) 5 MG tablet TAKE 1 TABLET BY MOUTH EVERY DAY 90 tablet 3  citalopram (CELEXA) 20 MG tablet TAKE 1 TABLET BY MOUTH EVERY DAY 90 tablet 3   Cyanocobalamin (VITAMIN B-12 PO) Take by mouth.     iron polysaccharides (NIFEREX) 150 MG capsule Take 1 capsule (150 mg total) by mouth daily. 30 capsule 3   Multiple Vitamin (MULTIVITAMIN) tablet Take 1 tablet by mouth daily.     Pyridoxine HCl (VITAMIN B-6 PO) Take by mouth.     VITAMIN D PO Take by mouth.     No current facility-administered medications for this visit.     PHYSICAL EXAMINATION:  Vitals:   06/30/22 1514  BP: (!) 137/97  Pulse: 83  Temp: (!) 96.2 F (35.7 C)  SpO2: 100%   Filed Weights   06/30/22 1514  Weight: 273 lb 6.4 oz (124 kg)    Physical Exam Constitutional:      General: She is not in acute distress.    Appearance: She is obese.  HENT:     Head: Normocephalic and atraumatic.  Eyes:     General: No scleral icterus. Cardiovascular:     Rate and Rhythm: Normal rate and regular rhythm.     Heart sounds: Normal heart sounds.  Pulmonary:     Effort: Pulmonary effort is normal. No respiratory distress.     Breath sounds: No wheezing.  Abdominal:     General: Bowel sounds are normal. There is no distension.     Palpations: Abdomen is soft.  Musculoskeletal:        General: No deformity. Normal range of motion.     Cervical back: Normal range of motion and  neck supple.  Skin:    General: Skin is warm and dry.     Findings: No erythema or rash.  Neurological:     Mental Status: She is alert and oriented to person, place, and time. Mental status is at baseline.     Cranial Nerves: No cranial nerve deficit.     Coordination: Coordination normal.  Psychiatric:        Mood and Affect: Mood normal.     LABORATORY DATA:  I have reviewed the data as listed    Latest Ref Rng & Units 06/25/2022    1:07 PM 02/05/2022    3:18 PM 10/29/2021    4:12 PM  CBC  WBC 4.0 - 10.5 K/uL 5.6  8.7  8.0   Hemoglobin 12.0 - 15.0 g/dL 16.110.7  9.7  9.2   Hematocrit 36.0 - 46.0 % 33.1  30.5  30.7   Platelets 150 - 400 K/uL 421  404  461       Latest Ref Rng & Units 10/29/2021    4:12 PM 09/22/2021    4:14 PM 03/03/2020   10:15 AM  CMP  Glucose 70 - 99 mg/dL 096109  85  045102   BUN 6 - 20 mg/dL 11  12  11    Creatinine 0.44 - 1.00 mg/dL 4.090.87  8.110.90  9.140.80   Sodium 135 - 145 mmol/L 136  138  137   Potassium 3.5 - 5.1 mmol/L 4.0  4.2  4.5   Chloride 98 - 111 mmol/L 105  102  104   CO2 22 - 32 mmol/L 26  23  19    Calcium 8.9 - 10.3 mg/dL 9.1  9.6  9.8   Total Protein 6.5 - 8.1 g/dL 8.4  8.1  8.0   Total Bilirubin 0.3 - 1.2 mg/dL 0.3  <7.8<0.2  0.3   Alkaline Phos 38 - 126 U/L  63  84  69   AST 15 - 41 U/L 18  15  13    ALT 0 - 44 U/L 16  11  11      Iron/TIBC/Ferritin/ %Sat    Component Value Date/Time   IRON 29 06/25/2022 1307   TIBC 449 06/25/2022 1307   FERRITIN 5 (L) 06/25/2022 1307   IRONPCTSAT 7 (L) 06/25/2022 1307      RADIOGRAPHIC STUDIES: I have personally reviewed the radiological images as listed and agreed with the findings in the report. No results found.

## 2022-07-02 NOTE — Assessment & Plan Note (Signed)
Recommend patient to discuss with gynecology for evaluation and management.

## 2022-07-02 NOTE — Assessment & Plan Note (Signed)
Likely reactive due to iron deficiency.  Continue monitor.

## 2022-07-02 NOTE — Assessment & Plan Note (Signed)
Labs reviewed and discussed with patient. Hemoglobin and iron panel have improved.  Still iron deficiency. Recommend additional IV Venofer weekly x 2.  Patient declined pregnancy testing.

## 2022-07-02 NOTE — Assessment & Plan Note (Signed)
Refer to genetic counselor.  

## 2022-07-07 ENCOUNTER — Inpatient Hospital Stay: Payer: Medicaid Other

## 2022-07-14 ENCOUNTER — Inpatient Hospital Stay: Payer: Medicaid Other | Attending: Oncology

## 2022-07-14 VITALS — BP 143/89 | HR 85 | Temp 97.6°F | Resp 17

## 2022-07-14 DIAGNOSIS — Z79899 Other long term (current) drug therapy: Secondary | ICD-10-CM | POA: Insufficient documentation

## 2022-07-14 DIAGNOSIS — D75839 Thrombocytosis, unspecified: Secondary | ICD-10-CM | POA: Insufficient documentation

## 2022-07-14 DIAGNOSIS — D500 Iron deficiency anemia secondary to blood loss (chronic): Secondary | ICD-10-CM | POA: Insufficient documentation

## 2022-07-14 DIAGNOSIS — N920 Excessive and frequent menstruation with regular cycle: Secondary | ICD-10-CM | POA: Diagnosis not present

## 2022-07-14 MED ORDER — SODIUM CHLORIDE 0.9 % IV SOLN
Freq: Once | INTRAVENOUS | Status: AC
  Filled 2022-07-14: qty 250

## 2022-07-14 MED ORDER — SODIUM CHLORIDE 0.9 % IV SOLN
200.0000 mg | Freq: Once | INTRAVENOUS | Status: AC
  Administered 2022-07-14: 200 mg via INTRAVENOUS
  Filled 2022-07-14: qty 200

## 2022-08-10 ENCOUNTER — Inpatient Hospital Stay: Payer: Medicaid Other

## 2022-08-10 ENCOUNTER — Inpatient Hospital Stay: Payer: Medicaid Other | Admitting: Licensed Clinical Social Worker

## 2022-08-31 ENCOUNTER — Emergency Department (HOSPITAL_COMMUNITY)
Admission: EM | Admit: 2022-08-31 | Discharge: 2022-08-31 | Disposition: A | Discharge status: Home / Self Care | Payer: BC Managed Care – PPO | Attending: Emergency Medicine | Admitting: Emergency Medicine

## 2022-08-31 ENCOUNTER — Encounter (HOSPITAL_COMMUNITY): Payer: Self-pay | Admitting: Pharmacy Technician

## 2022-08-31 ENCOUNTER — Encounter: Payer: Self-pay | Admitting: Oncology

## 2022-08-31 ENCOUNTER — Other Ambulatory Visit: Payer: Self-pay

## 2022-08-31 DIAGNOSIS — S161XXA Strain of muscle, fascia and tendon at neck level, initial encounter: Secondary | ICD-10-CM | POA: Diagnosis not present

## 2022-08-31 DIAGNOSIS — M542 Cervicalgia: Secondary | ICD-10-CM | POA: Diagnosis present

## 2022-08-31 DIAGNOSIS — X58XXXA Exposure to other specified factors, initial encounter: Secondary | ICD-10-CM | POA: Insufficient documentation

## 2022-08-31 MED ORDER — METHOCARBAMOL 500 MG PO TABS: 500.0000 mg | ORAL_TABLET | Freq: Two times a day (BID) | ORAL | 0 refills | Status: AC | PRN

## 2022-08-31 MED ORDER — IBUPROFEN 800 MG PO TABS: 800.0000 mg | ORAL_TABLET | Freq: Three times a day (TID) | ORAL | 0 refills | Status: AC | PRN

## 2022-08-31 NOTE — ED Triage Notes (Signed)
Pt here with reports of L sided neck pain onset when she awoke this morning. Denies injury.

## 2022-08-31 NOTE — ED Notes (Signed)
Patient Alert and oriented to baseline. Stable and ambulatory to baseline. Patient verbalized understanding of the discharge instructions.  Patient belongings were taken by the patient.   

## 2022-08-31 NOTE — ED Provider Notes (Signed)
[facility] Provider Note   CSN: [id number] Arrival date & time: 08/31/22  0701     History  Chief Complaint  Patient presents with   Neck Pain    Kim Duran is a 37 y.o. female.  She is here with a complaint of left-sided neck pain since she woke up this morning.  It is worse with turning her head and taking a deep breath.  Sharp and stabbing.  No trauma.  No fevers or chills.  No difficulty swallowing or speaking.  She has tried nothing for it.  The history is provided by the patient.  Neck Pain Pain location:  L side Quality:  Stabbing Pain radiates to:  L shoulder Pain severity:  Severe Pain is:  Same all the time Timing:  Intermittent Progression:  Unchanged Chronicity:  New Relieved by:  None tried Worsened by:  Position and bending Ineffective treatments:  None tried Associated symptoms: no chest pain, no fever, no headaches, no numbness, no visual change and no weakness        Home Medications Prior to Admission medications   Medication Sig Start Date End Date Taking? Authorizing Provider  amLODipine (NORVASC) 5 MG tablet TAKE 1 TABLET BY MOUTH EVERY DAY 11/30/21   Roma Schanz, CNM  citalopram (CELEXA) 20 MG tablet TAKE 1 TABLET BY MOUTH EVERY DAY 11/30/21   Roma Schanz, CNM  Cyanocobalamin (VITAMIN B-12 PO) Take by mouth.    [provider]  iron polysaccharides (NIFEREX) 150 MG capsule Take 1 capsule (150 mg total) by mouth daily. 06/30/22   Earlie Server, MD  Multiple Vitamin (MULTIVITAMIN) tablet Take 1 tablet by mouth daily.    [provider]  Pyridoxine HCl (VITAMIN B-6 PO) Take by mouth.    [provider]  VITAMIN D PO Take by mouth.    [provider]      Allergies    Patient has no known allergies.    Review of Systems   Review of Systems  Constitutional:  Negative for fever.  HENT:  Negative for trouble swallowing.   Cardiovascular:  Negative for  chest pain.  Musculoskeletal:  Positive for neck pain.  Neurological:  Negative for weakness, numbness and headaches.    Physical Exam Updated Vital Signs BP (!) 141/101   Pulse 91   Temp 99 F (37.2 C)   Resp 18   SpO2 99%  Physical Exam Constitutional:      Appearance: Normal appearance. She is well-developed.  HENT:     Head: Normocephalic and atraumatic.  Eyes:     Conjunctiva/sclera: Conjunctivae normal.  Neck:     Comments: She has no midline posterior neck tenderness.  There is no masses appreciated.  She is tender on her left sternocleidomastoid.  There is no adenopathy.  Trachea is midline no stridor.  No overlying skin changes. Musculoskeletal:     Cervical back: Tenderness present.  Skin:    General: Skin is warm and dry.  Neurological:     General: No focal deficit present.     Mental Status: She is alert.     GCS: GCS eye subscore is 4. GCS verbal subscore is 5. GCS motor subscore is 6.     Cranial Nerves: No cranial nerve deficit.     Sensory: No sensory deficit.     Motor: No weakness.     ED Results / Procedures / Treatments   Labs (all labs ordered are listed, but  only abnormal results are displayed) Labs Reviewed - No data to display  EKG None  Radiology No results found.  Procedures Procedures    Medications Ordered in ED Medications - No data to display  ED Course/ Medical Decision Making/ A&P                             Medical Decision Making Risk Prescription drug management.   Differential diagnosis includes muscular pain, mass, adenopathy, less likely fracture.  Patient experienced no trauma is neuro intact afebrile.  She has reproducible muscular tenderness on exam.  Will trial her on NSAIDs muscle relaxant recommended close follow-up with PCP and return instructions discussed        Final Clinical Impression(s) / ED Diagnoses Final diagnoses:  Acute strain of neck muscle, initial encounter    Rx / DC Orders ED  Discharge Orders          Ordered    ibuprofen (ADVIL) 800 MG tablet  Every 8 hours PRN        08/31/22 0747    methocarbamol (ROBAXIN) 500 MG tablet  2 times daily PRN        08/31/22 0747              Hayden Rasmussen, MD 08/31/22 1827

## 2022-10-29 ENCOUNTER — Inpatient Hospital Stay: Payer: BC Managed Care – PPO

## 2022-11-01 ENCOUNTER — Inpatient Hospital Stay: Payer: BC Managed Care – PPO | Attending: Oncology

## 2022-11-01 ENCOUNTER — Other Ambulatory Visit: Payer: Self-pay

## 2022-11-01 DIAGNOSIS — D500 Iron deficiency anemia secondary to blood loss (chronic): Secondary | ICD-10-CM

## 2022-11-01 DIAGNOSIS — D509 Iron deficiency anemia, unspecified: Secondary | ICD-10-CM | POA: Diagnosis present

## 2022-11-01 DIAGNOSIS — D75839 Thrombocytosis, unspecified: Secondary | ICD-10-CM | POA: Insufficient documentation

## 2022-11-01 DIAGNOSIS — Z79899 Other long term (current) drug therapy: Secondary | ICD-10-CM | POA: Insufficient documentation

## 2022-11-01 LAB — RETIC PANEL
Immature Retic Fract: 21.2 % — ABNORMAL HIGH (ref 2.3–15.9)
RBC.: 5.02 MIL/uL (ref 3.87–5.11)
Retic Count, Absolute: 82.3 10*3/uL (ref 19.0–186.0)
Retic Ct Pct: 1.6 % (ref 0.4–3.1)
Reticulocyte Hemoglobin: 26.2 pg — ABNORMAL LOW (ref >27.9)

## 2022-11-01 LAB — CBC WITH DIFFERENTIAL/PLATELET
Abs Immature Granulocytes: 0.08 10*3/uL — ABNORMAL HIGH (ref 0.00–0.07)
Basophils Absolute: 0.1 10*3/uL (ref 0.0–0.1)
Basophils Relative: 1 %
Eosinophils Absolute: 0.2 10*3/uL (ref 0.0–0.5)
Eosinophils Relative: 3 %
HCT: 37.2 % (ref 36.0–46.0)
Hemoglobin: 11.9 g/dL — ABNORMAL LOW (ref 12.0–15.0)
Immature Granulocytes: 1 %
Lymphocytes Relative: 20 %
Lymphs Abs: 1.6 10*3/uL (ref 0.7–4.0)
MCH: 23.7 pg — ABNORMAL LOW (ref 26.0–34.0)
MCHC: 32 g/dL (ref 30.0–36.0)
MCV: 74 fL — ABNORMAL LOW (ref 80.0–100.0)
Monocytes Absolute: 0.6 10*3/uL (ref 0.1–1.0)
Monocytes Relative: 8 %
Neutro Abs: 5.2 10*3/uL (ref 1.7–7.7)
Neutrophils Relative %: 67 %
Platelets: 402 10*3/uL — ABNORMAL HIGH (ref 150–400)
RBC: 5.03 MIL/uL (ref 3.87–5.11)
RDW: 18.6 % — ABNORMAL HIGH (ref 11.5–15.5)
WBC: 7.7 10*3/uL (ref 4.0–10.5)
nRBC: 0 % (ref 0.0–0.2)

## 2022-11-01 LAB — FERRITIN: Ferritin: 12 ng/mL (ref 11–307)

## 2022-11-01 LAB — IRON AND TIBC
Iron: 59 ug/dL (ref 28–170)
Saturation Ratios: 14 % (ref 10.4–31.8)
TIBC: 431 ug/dL (ref 250–450)
UIBC: 372 ug/dL

## 2022-11-01 LAB — PREGNANCY, URINE: Preg Test, Ur: NEGATIVE

## 2022-11-01 MED FILL — Iron Sucrose Inj 20 MG/ML (Fe Equiv): INTRAVENOUS | Qty: 10 | Status: AC

## 2022-11-02 ENCOUNTER — Inpatient Hospital Stay: Payer: BC Managed Care – PPO | Admitting: Oncology

## 2022-11-02 ENCOUNTER — Inpatient Hospital Stay: Payer: BC Managed Care – PPO

## 2022-11-09 ENCOUNTER — Inpatient Hospital Stay: Payer: BC Managed Care – PPO

## 2022-11-09 ENCOUNTER — Inpatient Hospital Stay: Payer: BC Managed Care – PPO | Admitting: Oncology

## 2022-11-15 MED FILL — Iron Sucrose Inj 20 MG/ML (Fe Equiv): INTRAVENOUS | Qty: 10 | Status: AC

## 2022-11-16 ENCOUNTER — Inpatient Hospital Stay: Payer: BC Managed Care – PPO

## 2022-11-16 ENCOUNTER — Encounter: Payer: Self-pay | Admitting: Oncology

## 2022-11-16 ENCOUNTER — Inpatient Hospital Stay: Payer: BC Managed Care – PPO | Attending: Oncology | Admitting: Oncology

## 2022-11-16 VITALS — BP 131/93 | HR 92 | Temp 96.6°F | Resp 18 | Wt 265.5 lb

## 2022-11-16 DIAGNOSIS — Z8041 Family history of malignant neoplasm of ovary: Secondary | ICD-10-CM | POA: Insufficient documentation

## 2022-11-16 DIAGNOSIS — D500 Iron deficiency anemia secondary to blood loss (chronic): Secondary | ICD-10-CM | POA: Diagnosis not present

## 2022-11-16 DIAGNOSIS — N920 Excessive and frequent menstruation with regular cycle: Secondary | ICD-10-CM | POA: Insufficient documentation

## 2022-11-16 DIAGNOSIS — Z803 Family history of malignant neoplasm of breast: Secondary | ICD-10-CM | POA: Insufficient documentation

## 2022-11-16 DIAGNOSIS — Z5986 Financial insecurity: Secondary | ICD-10-CM | POA: Insufficient documentation

## 2022-11-16 DIAGNOSIS — Z79899 Other long term (current) drug therapy: Secondary | ICD-10-CM | POA: Insufficient documentation

## 2022-11-16 DIAGNOSIS — D509 Iron deficiency anemia, unspecified: Secondary | ICD-10-CM | POA: Insufficient documentation

## 2022-11-16 DIAGNOSIS — Z833 Family history of diabetes mellitus: Secondary | ICD-10-CM | POA: Diagnosis not present

## 2022-11-16 DIAGNOSIS — Z8249 Family history of ischemic heart disease and other diseases of the circulatory system: Secondary | ICD-10-CM | POA: Insufficient documentation

## 2022-11-16 DIAGNOSIS — D75839 Thrombocytosis, unspecified: Secondary | ICD-10-CM | POA: Diagnosis not present

## 2022-11-16 DIAGNOSIS — E669 Obesity, unspecified: Secondary | ICD-10-CM | POA: Diagnosis not present

## 2022-11-16 DIAGNOSIS — R5383 Other fatigue: Secondary | ICD-10-CM | POA: Insufficient documentation

## 2022-11-16 DIAGNOSIS — Z8042 Family history of malignant neoplasm of prostate: Secondary | ICD-10-CM | POA: Diagnosis not present

## 2022-11-16 DIAGNOSIS — Z809 Family history of malignant neoplasm, unspecified: Secondary | ICD-10-CM | POA: Insufficient documentation

## 2022-11-16 DIAGNOSIS — Z90721 Acquired absence of ovaries, unilateral: Secondary | ICD-10-CM | POA: Diagnosis not present

## 2022-11-16 NOTE — Assessment & Plan Note (Signed)
Refer to genetic counselor.  

## 2022-11-16 NOTE — Progress Notes (Signed)
Hematology/Oncology Progress note Telephone:(336) [phone] Fax:(336) [phone]            Patient Care Team: [facility] as PCP - General Rickard Patience, MD as Consulting Physician (Oncology)  CHIEF COMPLAINTS/REASON FOR VISIT:  Follow up for anemia and thrombocytosis   ASSESSMENT & PLAN:   IDA (iron deficiency anemia) Labs reviewed and discussed with patient. Hemoglobin and iron panel have improved.   Lab Results  Component Value Date   IRON 59 11/01/2022   TIBC 431 11/01/2022   IRONPCTSAT 14 11/01/2022   FERRITIN 12 11/01/2022    Hold off IV Venofer.  Recommend patient to take Niferrex 150mg  daily. She may also take her multivitamin daily.    Family history of cancer Refer to genetic counselor.  Thrombocytosis Likely reactive due to iron deficiency.  Continue monitor.  Orders Placed This Encounter  Procedures   CBC with Differential (Cancer Center Only)    Standing Status:   Future    Standing Expiration Date:   11/16/2023   Ferritin    Standing Status:   Future    Standing Expiration Date:   11/16/2023   Iron and TIBC    Standing Status:   Future    Standing Expiration Date:   11/16/2023   Follow-up in 4 months. All questions were answered. The patient knows to call the clinic with any problems, questions or concerns.  Rickard Patience, MD, PhD [facility] Hematology Oncology 11/16/2022   HISTORY OF PRESENTING ILLNESS:   Kim Duran is a  37 y.o.  female presents for follow up of iron deficiency anemia and thrombocytosis 09/22/2021, patient had a CBC done which showed a hemoglobin of 9.2, MCV 66, platelets 511.  I reviewed patient's previous lab records. Patient has chronic anemia, MCV is chronically decreased. Patient reports heavy menstrual period.  She is not interested in any further intervention.  Denies blood in the stool. Patient is married for 5 children.  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   INTERVAL HISTORY Kim Duran is a 37 y.o. female who has above history reviewed by me today presents for follow up visit for iron deficincy anemia and thrombocytosis Patient tolerates IV Venofer treatments, although she does not feel significant improvement after iron infusions.  Heavy menstrual bleeding. She has not taken Niferrex yet and plans to get from pharmacy    Review of Systems  Constitutional:  Positive for fatigue. Negative for appetite change, chills and fever.  HENT:   Negative for hearing loss and voice change.   Eyes:  Negative for eye problems.  Respiratory:  Negative for chest tightness and cough.   Cardiovascular:  Negative for chest pain.  Gastrointestinal:  Negative for abdominal distention, abdominal pain and blood in stool.  Endocrine: Negative for hot flashes.  Genitourinary:  Negative for difficulty urinating and frequency.   Musculoskeletal:  Negative for arthralgias.  Skin:  Negative for itching and rash.  Neurological:  Negative for extremity weakness.  Hematological:  Negative for adenopathy.  Psychiatric/Behavioral:  Negative for confusion.     MEDICAL HISTORY:  Past Medical History:  Diagnosis Date   Medical history non-contributory     SURGICAL HISTORY: Past Surgical History:  Procedure Laterality Date   CESAREAN SECTION     CESAREAN SECTION N/A 01/23/2018   Procedure: CESAREAN SECTION;  Surgeon: Conan Bowens, MD;  Location: [facility];  Service: Obstetrics;  Laterality: N/A;   left ovary and tube removed  08/16/2009   10lb tumor "wrapped  around ovary"     SOCIAL HISTORY: Social History   Socioeconomic History   Marital status: Married    Spouse name: Not on file   Number of children: Not on file   Years of education: Not on file   Highest education level: Not on file  Occupational History   Not on file  Tobacco Use   Smoking status: Never   Smokeless tobacco: Never  Vaping Use   Vaping Use: Never used  Substance and Sexual Activity    Alcohol use: No   Drug use: No   Sexual activity: Yes    Birth control/protection: None  Other Topics Concern   Not on file  Social History Narrative   Not on file   Social Determinants of Health   Financial Resource Strain: Medium Risk (09/22/2021)   Overall Financial Resource Strain (CARDIA)    Difficulty of Paying Living Expenses: Somewhat hard  Food Insecurity: Food Insecurity Present (09/22/2021)   Hunger Vital Sign    Worried About Running Out of Food in the Last Year: Sometimes true    Ran Out of Food in the Last Year: Sometimes true  Transportation Needs: No Transportation Needs (09/22/2021)   PRAPARE - Administrator, Civil Service (Medical): No    Lack of Transportation (Non-Medical): No  Physical Activity: Insufficiently Active (09/22/2021)   Exercise Vital Sign    Days of Exercise per Week: 2 days    Minutes of Exercise per Session: 30 min  Stress: Stress Concern Present (09/22/2021)   [employer] of Occupational Health - Occupational Stress Questionnaire    Feeling of Stress : Very much  Social Connections: Socially Integrated (09/22/2021)   Social Connection and Isolation Panel [NHANES]    Frequency of Communication with Friends and Family: More than three times a week    Frequency of Social Gatherings with Friends and Family: Never    Attends Religious Services: More than 4 times per year    Active Member of [employer] or Organizations: Yes    Attends Banker Meetings: Never    Marital Status: Married  Catering manager Violence: Not At Risk (09/22/2021)   Humiliation, Afraid, Rape, and Kick questionnaire    Fear of Current or Ex-Partner: No    Emotionally Abused: No    Physically Abused: No    Sexually Abused: No    FAMILY HISTORY: Family History  Problem Relation Age of Onset   Hypertension Mother    Diabetes Mother        pre-diabetes   Hypertension Father    Cancer Paternal Aunt        breast cancer   Cancer Paternal Aunt         breast   Cancer Maternal Grandmother        liver   Cancer Paternal Grandfather    Cancer Maternal Grandfather        prostate   Premature birth Daughter        born at 73 weeks   Diabetes Paternal Aunt    Cancer Other        breast and ovarian ca. relation unspecified    ALLERGIES:  has No Known Allergies.  MEDICATIONS:  Current Outpatient Medications  Medication Sig Dispense Refill   amLODipine (NORVASC) 5 MG tablet TAKE 1 TABLET BY MOUTH EVERY DAY 90 tablet 3   citalopram (CELEXA) 20 MG tablet TAKE 1 TABLET BY MOUTH EVERY DAY 90 tablet 3   Cyanocobalamin (VITAMIN B-12 PO) Take by  mouth.     ibuprofen (ADVIL) 800 MG tablet Take 1 tablet (800 mg total) by mouth every 8 (eight) hours as needed. 21 tablet 0   methocarbamol (ROBAXIN) 500 MG tablet Take 1 tablet (500 mg total) by mouth 2 (two) times daily as needed for muscle spasms. 20 tablet 0   Multiple Vitamin (MULTIVITAMIN) tablet Take 1 tablet by mouth daily.     Pyridoxine HCl (VITAMIN B-6 PO) Take by mouth.     iron polysaccharides (NIFEREX) 150 MG capsule Take 1 capsule (150 mg total) by mouth daily. (Patient not taking: Reported on 11/16/2022) 30 capsule 3   VITAMIN D PO Take by mouth. (Patient not taking: Reported on 11/16/2022)     No current facility-administered medications for this visit.     PHYSICAL EXAMINATION:  Vitals:   11/16/22 1316  BP: (!) 131/93  Pulse: 92  Resp: 18  Temp: (!) 96.6 F (35.9 C)  SpO2: 100%   Filed Weights   11/16/22 1316  Weight: 265 lb 8 oz (120.4 kg)    Physical Exam Constitutional:      General: She is not in acute distress.    Appearance: She is obese.  HENT:     Head: Normocephalic and atraumatic.  Eyes:     General: No scleral icterus. Cardiovascular:     Rate and Rhythm: Normal rate and regular rhythm.     Heart sounds: Normal heart sounds.  Pulmonary:     Effort: Pulmonary effort is normal. No respiratory distress.     Breath sounds: No wheezing.  Abdominal:      General: Bowel sounds are normal. There is no distension.     Palpations: Abdomen is soft.  Musculoskeletal:        General: No deformity. Normal range of motion.     Cervical back: Normal range of motion and neck supple.  Skin:    General: Skin is warm and dry.     Findings: No erythema or rash.  Neurological:     Mental Status: She is alert and oriented to person, place, and time. Mental status is at baseline.     Cranial Nerves: No cranial nerve deficit.     Coordination: Coordination normal.  Psychiatric:        Mood and Affect: Mood normal.     LABORATORY DATA:  I have reviewed the data as listed    Latest Ref Rng & Units 11/01/2022    1:22 PM 06/25/2022    1:07 PM 02/05/2022    3:18 PM  CBC  WBC 4.0 - 10.5 K/uL 7.7  5.6  8.7   Hemoglobin 12.0 - 15.0 g/dL 16.1  09.6  9.7   Hematocrit 36.0 - 46.0 % 37.2  33.1  30.5   Platelets 150 - 400 K/uL 402  421  404       Latest Ref Rng & Units 10/29/2021    4:12 PM 09/22/2021    4:14 PM 03/03/2020   10:15 AM  CMP  Glucose 70 - 99 mg/dL 045  85  409   BUN 6 - 20 mg/dL 11  12  11    Creatinine 0.44 - 1.00 mg/dL 8.11  9.14  7.82   Sodium 135 - 145 mmol/L 136  138  137   Potassium 3.5 - 5.1 mmol/L 4.0  4.2  4.5   Chloride 98 - 111 mmol/L 105  102  104   CO2 22 - 32 mmol/L 26  23  19    Calcium  8.9 - 10.3 mg/dL 9.1  9.6  9.8   Total Protein 6.5 - 8.1 g/dL 8.4  8.1  8.0   Total Bilirubin 0.3 - 1.2 mg/dL 0.3  <0.9  0.3   Alkaline Phos 38 - 126 U/L 63  84  69   AST 15 - 41 U/L 18  15  13    ALT 0 - 44 U/L 16  11  11      Iron/TIBC/Ferritin/ %Sat    Component Value Date/Time   IRON 59 11/01/2022 1322   TIBC 431 11/01/2022 1322   FERRITIN 12 11/01/2022 1322   IRONPCTSAT 14 11/01/2022 1322      RADIOGRAPHIC STUDIES: I have personally reviewed the radiological images as listed and agreed with the findings in the report. No results found.

## 2022-11-16 NOTE — Assessment & Plan Note (Signed)
Likely reactive due to iron deficiency.  Continue monitor. 

## 2022-11-16 NOTE — Assessment & Plan Note (Addendum)
Labs reviewed and discussed with patient. Hemoglobin and iron panel have improved.   Lab Results  Component Value Date   IRON 59 11/01/2022   TIBC 431 11/01/2022   IRONPCTSAT 14 11/01/2022   FERRITIN 12 11/01/2022    Hold off IV Venofer.  Recommend patient to take Niferrex 150mg  daily. She may also take her multivitamin daily.

## 2022-12-02 ENCOUNTER — Ambulatory Visit: Payer: BC Managed Care – PPO

## 2022-12-02 ENCOUNTER — Ambulatory Visit: Payer: BC Managed Care – PPO | Admitting: Oncology

## 2022-12-02 IMAGING — MG DIGITAL DIAGNOSTIC BILAT W/ TOMO W/ CAD
8 series · 8 of 24 positions shown · non-contrast
Comparison: None Available.
COMPARISON: None Available.

Addendum:
CLINICAL DATA: 35-year-old female with nonfocal left breast pain.

EXAM:
DIGITAL DIAGNOSTIC BILATERAL MAMMOGRAM WITH TOMOSYNTHESIS AND CAD
TECHNIQUE: Bilateral digital diagnostic mammography and breast tomosynthesis
was performed. The images were evaluated with computer-aided
detection.

[L MLO synth-2D]
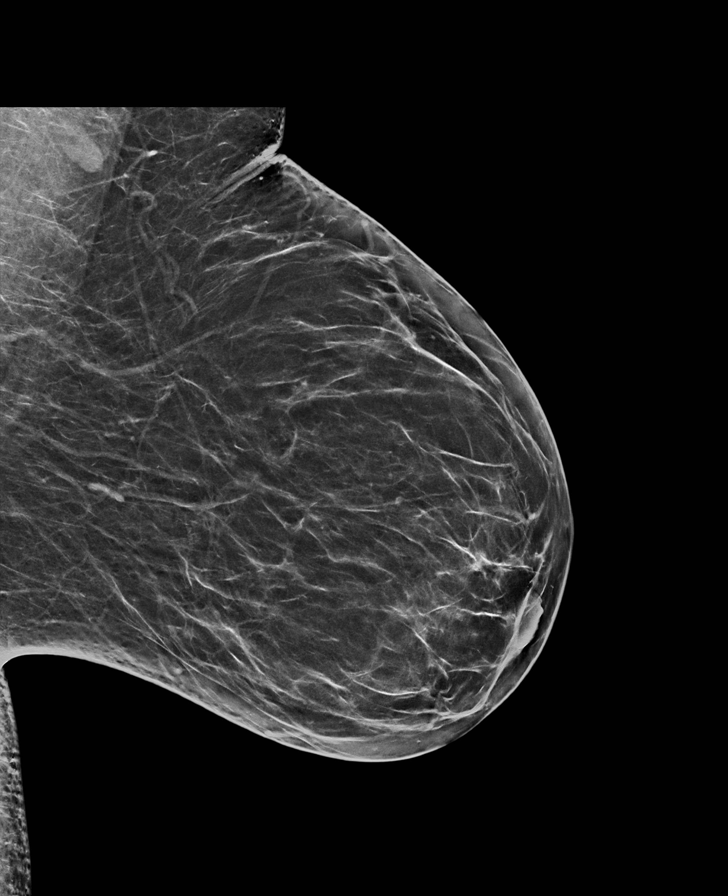

[R MLO synth-2D]
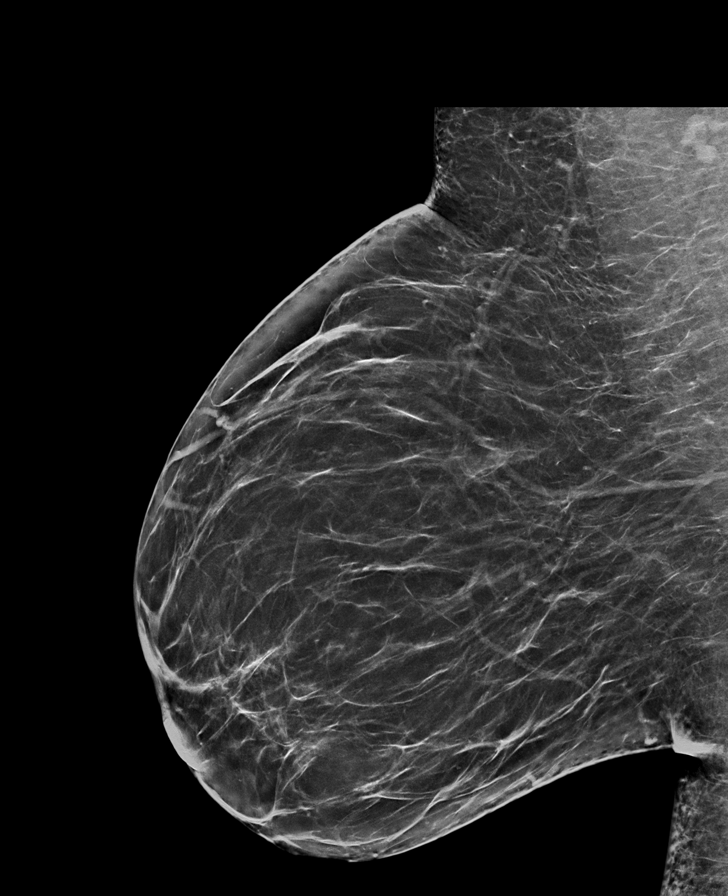

[L CC synth-2D]
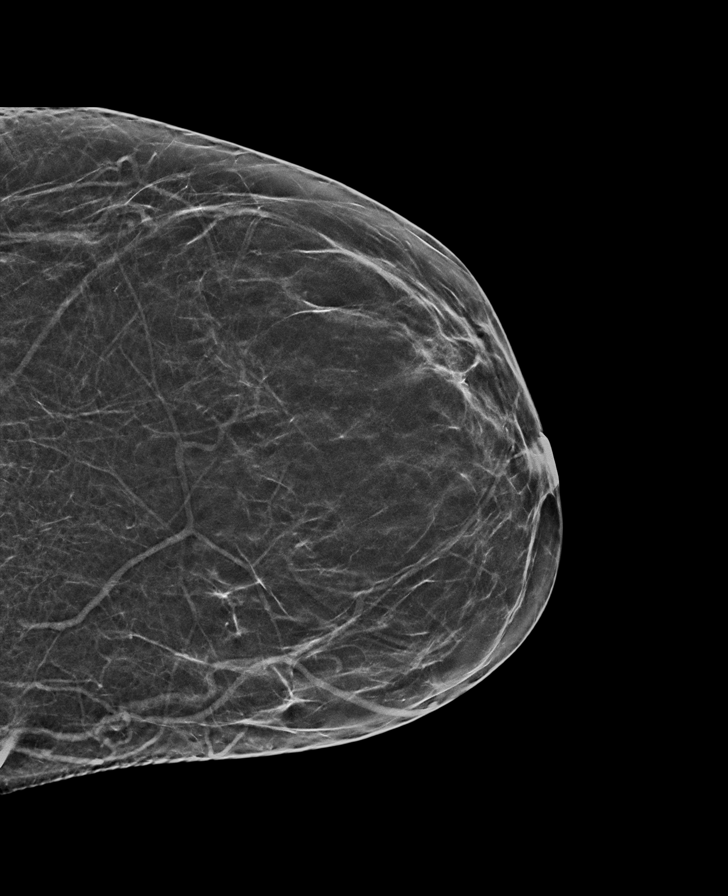

[R CC synth-2D]
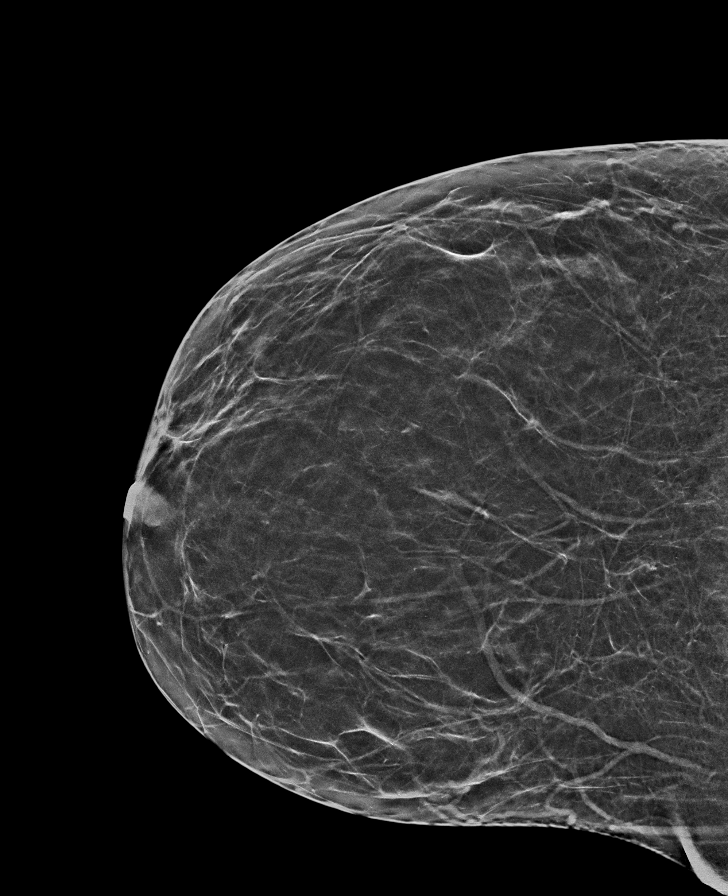

[R CC tomo · tomo slice 29/57.0]
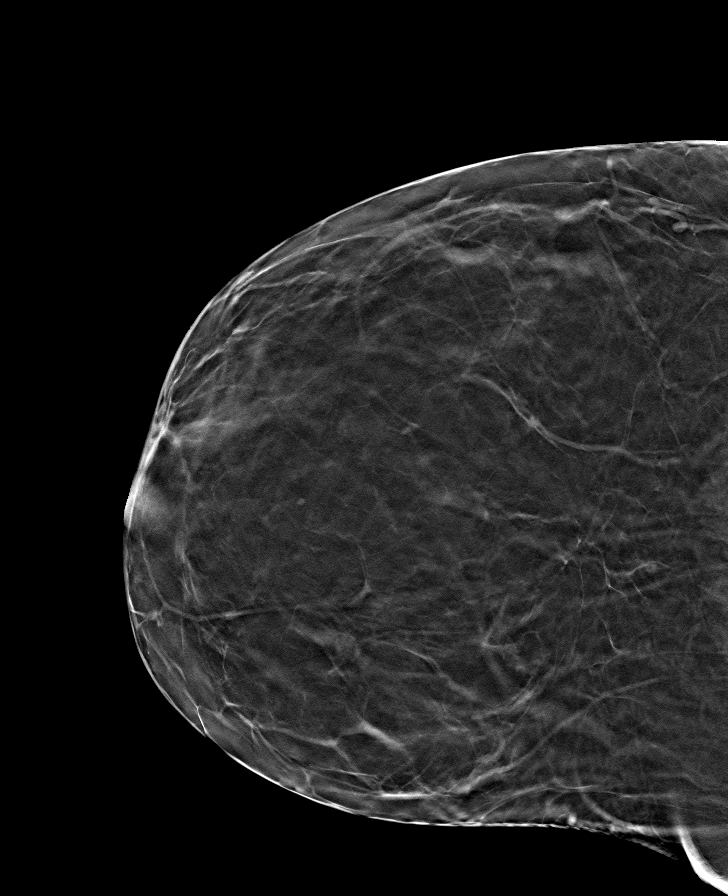

[L CC tomo · tomo slice 29/57.0]
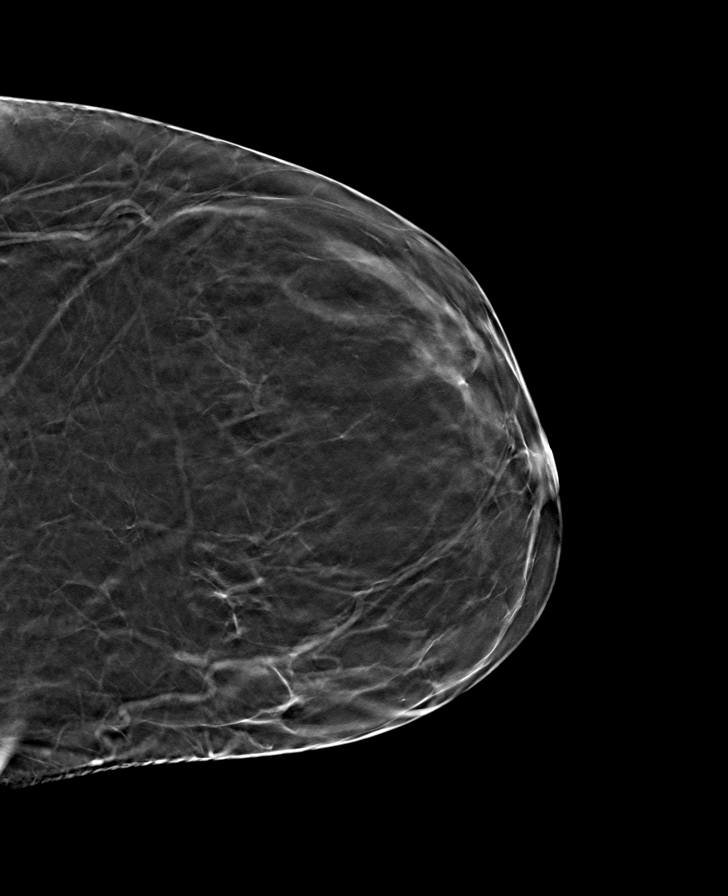

[L MLO tomo · tomo slice 35/68.0]
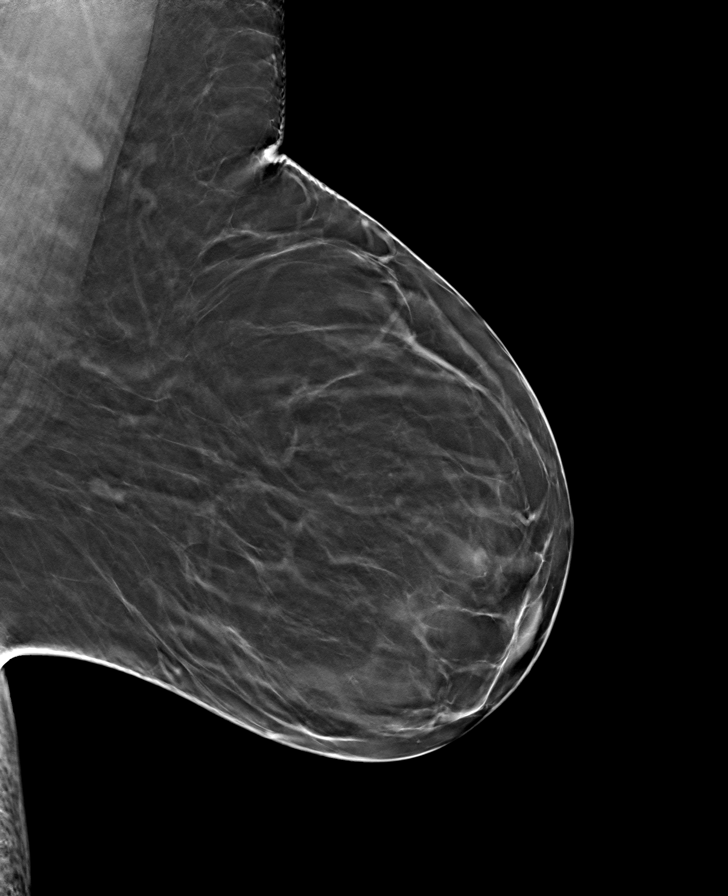

[R MLO tomo · tomo slice 37/74.0]
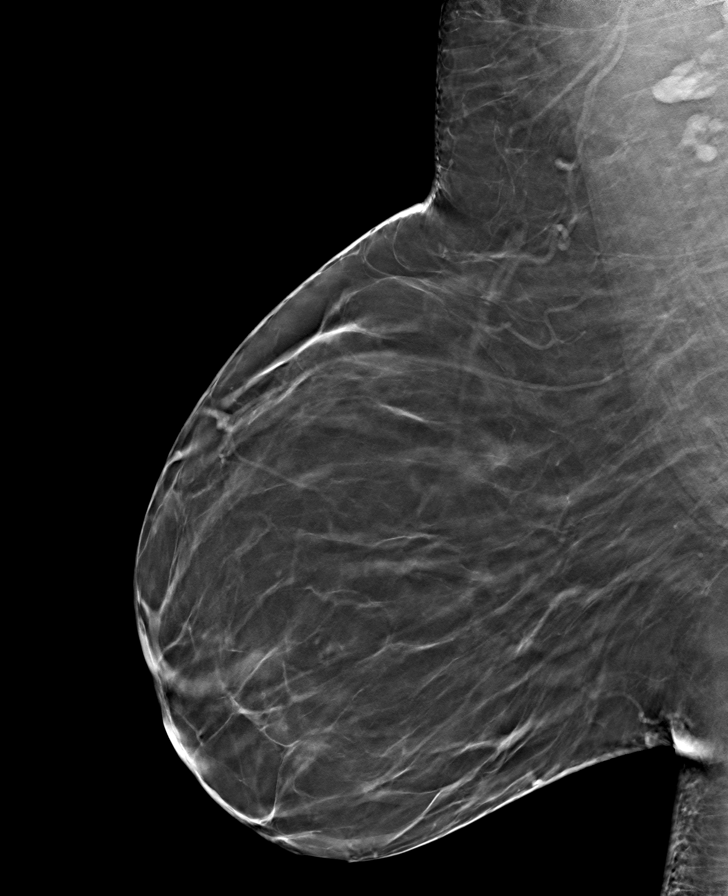

[8 of 24 positions shown; findings below may reference images not displayed]

ACR Breast Density Category b: There are scattered areas of
fibroglandular density.
FINDINGS: No suspicious masses or calcifications are seen in either breast.
There is no mammographic evidence of malignancy in either breast.
IMPRESSION: No mammographic evidence of malignancy in either breast.

RECOMMENDATION:
1. Recommend further management of nonfocal left breast pain be
based on clinical assessment.

2. Screening mammogram at age 40 unless there are persistent or
intervening clinical concerns. (Code:PT-J-E44)

3. The patient reportedly has had outside mammograms approximately 5
years prior. If they become available for comparison an addendum
will be made to this report.

I have discussed the findings and recommendations with the patient.
If applicable, a reminder letter will be sent to the patient
regarding the next appointment.

BI-RADS CATEGORY  1: Negative.

ADDENDUM:
The patient's outside images from [REDACTED] dated 09/27/2016
have become available for comparison. When compared to the prior
exams, there are no new masses, calcifications or any other
abnormalities seen in either breast. Breast density has
significantly decreased when compared to the prior exams.

The findings, impression and recommendations from the original
report remain.

BI-RADS category: 1: Negative.

*** End of Addendum ***
ACR Breast Density Category b: There are scattered areas of
fibroglandular density.
FINDINGS: No suspicious masses or calcifications are seen in either breast.
There is no mammographic evidence of malignancy in either breast.
IMPRESSION: No mammographic evidence of malignancy in either breast.

RECOMMENDATION:
1. Recommend further management of nonfocal left breast pain be
based on clinical assessment.

2. Screening mammogram at age 40 unless there are persistent or
intervening clinical concerns. (Code:PT-J-E44)

3. The patient reportedly has had outside mammograms approximately 5
years prior. If they become available for comparison an addendum
will be made to this report.

I have discussed the findings and recommendations with the patient.
If applicable, a reminder letter will be sent to the patient
regarding the next appointment.

BI-RADS CATEGORY  1: Negative.

## 2022-12-14 ENCOUNTER — Other Ambulatory Visit: Payer: BC Managed Care – PPO

## 2022-12-14 ENCOUNTER — Inpatient Hospital Stay: Payer: BC Managed Care – PPO | Admitting: Licensed Clinical Social Worker

## 2023-01-11 ENCOUNTER — Inpatient Hospital Stay: Payer: BC Managed Care – PPO | Attending: Oncology | Admitting: Licensed Clinical Social Worker

## 2023-01-11 ENCOUNTER — Inpatient Hospital Stay: Payer: BC Managed Care – PPO

## 2023-01-11 DIAGNOSIS — Z803 Family history of malignant neoplasm of breast: Secondary | ICD-10-CM

## 2023-01-11 DIAGNOSIS — Z800 Family history of malignant neoplasm of digestive organs: Secondary | ICD-10-CM

## 2023-01-11 DIAGNOSIS — Z8481 Family history of carrier of genetic disease: Secondary | ICD-10-CM | POA: Diagnosis not present

## 2023-01-11 DIAGNOSIS — Z8042 Family history of malignant neoplasm of prostate: Secondary | ICD-10-CM

## 2023-01-12 ENCOUNTER — Encounter: Payer: Self-pay | Admitting: Licensed Clinical Social Worker

## 2023-01-12 ENCOUNTER — Encounter: Payer: Self-pay | Admitting: Oncology

## 2023-01-12 NOTE — Progress Notes (Signed)
REFERRING PROVIDER: Rickard Patience, MD [address] [place],  [place] [address]  PRIMARY PROVIDER:  [facility]  PRIMARY REASON FOR VISIT:  1. Family history of breast cancer gene mutation in first degree relative   2. Family history of breast cancer   3. Family history of prostate cancer   4. Family history of pancreatic cancer    I connected with Ms. Clinton Sawyer on 01/11/2023 at 3:00 PM EDT by MyChart video conference and verified that I am speaking with the correct person using two identifiers.    Patient location: home Provider location: [facility]  HISTORY OF PRESENT ILLNESS:   Ms. Mcmichen, a 37 y.o. female, was seen for a [facility] cancer genetics consultation at the request of Dr. Cathie Hoops due to a family history of cancer.  Ms. Mccallen presents to clinic today to discuss the possibility of a hereditary predisposition to cancer, genetic testing, and to further clarify her future cancer risks, as well as potential cancer risks for family members.   CANCER HISTORY:   Ms. Maller is a 37 y.o. female with no personal history of cancer.    RISK FACTORS:  Menarche was at age 17-11.  First live birth at age 60.  Ovaries intact: has 1 ovary.  Hysterectomy: no.  Menopausal status: premenopausal.  HRT use: 0 years. Colonoscopy: no; not examined. Mammogram within the last year: yes. Number of breast biopsies: 0. Up to date with pelvic exams: yes - due for one soon.  Past Medical History:  Diagnosis Date   Medical history non-contributory     Past Surgical History:  Procedure Laterality Date   CESAREAN SECTION     CESAREAN SECTION N/A 01/23/2018   Procedure: CESAREAN SECTION;  Surgeon: Conan Bowens, MD;  Location: [facility] BIRTHING SUITES;  Service: Obstetrics;  Laterality: N/A;   left ovary and tube removed  08/16/2009   10lb tumor "wrapped around ovary"     FAMILY HISTORY:  We obtained a detailed, 4-generation family history.  Significant  diagnoses are listed below: Family History  Problem Relation Age of Onset   Hypertension Mother    Diabetes Mother        pre-diabetes   Hypertension Father    BRCA 1/2 Sister    Diabetes Paternal Aunt    Pancreatic cancer Maternal Grandmother 71   Prostate cancer Maternal Grandfather        dx 31s   Prostate cancer Paternal Grandfather        metastatic   Premature birth Daughter        born at 23 weeks   Breast cancer Maternal Aunt 68   Breast cancer Maternal Aunt 75   Prostate cancer Paternal Uncle        all 6 uncles with prostate cancer   Ms. Heibel has 2 daughters and 3 sons, no cancers. She has two sisters. One sister reportedly tested positive for a BRCA mutation either this year or last year, but patient does not have copy of report currently.   Ms. Kraner mother is living at 18. Two maternal aunts died of breast cancer, one diagnosed at 72, the other diagnosed at 88. Maternal grandmother died of pancreatic cancer at 42. Maternal grandfather had prostate cancer diagnosed in his 43s, possibly another cancer later in life, and died at 27.   Ms. Sharar father was diagnosed with prostate cancer last year at 66 and is doing well. All 6 of his brothers have also had prostate  cancer, one passed from it. Patient's paternal grandfather passed of metastatic prostate cancer in his 87s.   Ms. Corriveau is aware of previous family history of genetic testing for hereditary cancer risks. There is no reported Ashkenazi Jewish ancestry. There is no known consanguinity.    GENETIC COUNSELING ASSESSMENT: Ms. Rassel is a 37 y.o. female with a family history of breast and prostate cancer, and sister who reportedly tested positive for a BRCA mutation, which is somewhat suggestive of a hereditary cancer syndrome and predisposition to cancer. We, therefore, discussed and recommended the following at today's visit.   DISCUSSION: We discussed that approximately 10% of cancer is  hereditary. Most cases of hereditary breast/pancreatic/prostate cancer are associated with BRCA1/BRCA2 genes, although there are other genes associated with hereditary cancer as well. Cancers and risks are gene specific. We discussed that testing is beneficial for several reasons including knowing about cancer risks, identifying potential screening and risk-reduction options that may be appropriate, and to understand if other family members could be at risk for cancer and allow them to undergo genetic testing.   We reviewed the characteristics, features and inheritance patterns of hereditary cancer syndromes. We also discussed genetic testing, including the appropriate family members to test, the process of testing, insurance coverage and turn-around-time for results. We discussed the implications of a negative, positive and/or variant of uncertain significant result. We recommended Ms. Winegarner pursue genetic testing for the [facility] Multi-Cancer+RNA gene panel.   Based on Ms. Byrum's family history of cancer on both sides of her family and her sister's BRCA mutation, she meets medical criteria for genetic testing. Despite that she meets criteria, she may still have an out of pocket cost.  PLAN: After considering the risks, benefits, and limitations, Ms. Ismail provided informed consent to pursue genetic testing and the blood sample was sent to [facility] for analysis of the Multi-Cancer+RNA panel. Results should be available within approximately 2-3 weeks' time, at which point they will be disclosed by telephone to Ms. Felch, as will any additional recommendations warranted by these results. Ms. Gauntt will receive a summary of her genetic counseling visit and a copy of her results once available. This information will also be available in Epic.   Ms. Noguez questions were answered to her satisfaction today. Our contact information was provided should additional questions  or concerns arise. Thank you for the referral and allowing [place] to share in the care of your patient.   Lacy Duverney, MS, [facility] Genetic Counselor [email or web address] Phone: [phone]  The patient was seen for a total of 35 minutes in virtual genetic counseling.  Dr. Orlie Dakin was available for discussion regarding this case.   _______________________________________________________________________ For Office Staff:  Number of people involved in session: 2 Was an Intern/ student involved with case: yes; [employer] intern Maddy Maisie Fus was present for this case.

## 2023-01-14 ENCOUNTER — Inpatient Hospital Stay: Payer: BC Managed Care – PPO

## 2023-01-21 ENCOUNTER — Inpatient Hospital Stay: Payer: BC Managed Care – PPO

## 2023-02-02 ENCOUNTER — Ambulatory Visit: Payer: Self-pay | Admitting: Licensed Clinical Social Worker

## 2023-02-02 ENCOUNTER — Encounter: Payer: Self-pay | Admitting: Licensed Clinical Social Worker

## 2023-02-02 ENCOUNTER — Telehealth: Payer: Self-pay | Admitting: Licensed Clinical Social Worker

## 2023-02-02 DIAGNOSIS — Z1379 Encounter for other screening for genetic and chromosomal anomalies: Secondary | ICD-10-CM

## 2023-02-02 NOTE — Progress Notes (Signed)
HPI:   Kim Duran was previously seen in the [facility] clinic due to a family history of cancer and concerns regarding a hereditary predisposition to cancer. Please refer to our prior cancer genetics clinic note for more information regarding our discussion, assessment and recommendations, at the time. Kim Duran recent genetic test results were disclosed to her, as were recommendations warranted by these results. These results and recommendations are discussed in more detail below.  CANCER HISTORY:  Oncology History   No history exists.    FAMILY HISTORY:  We obtained a detailed, 4-generation family history.  Significant diagnoses are listed below: Family History  Problem Relation Age of Onset   Hypertension Mother    Diabetes Mother        pre-diabetes   Hypertension Father    BRCA 1/2 Sister    Diabetes Paternal Aunt    Pancreatic cancer Maternal Grandmother 67   Prostate cancer Maternal Grandfather        dx 78s   Prostate cancer Paternal Grandfather        metastatic   Premature birth Daughter        born at 48 weeks   Breast cancer Maternal Aunt 53   Breast cancer Maternal Aunt 41   Prostate cancer Paternal Uncle        all 6 uncles with prostate cancer    Kim Duran has 2 daughters and 3 sons, no cancers. She has two sisters. One sister reportedly tested positive for a BRCA mutation either this year or last year, but patient does not have copy of report currently.    Kim Duran mother is living at 49. Two maternal aunts died of breast cancer, one diagnosed at 3, the other diagnosed at 30. Maternal grandmother died of pancreatic cancer at 56. Maternal grandfather had prostate cancer diagnosed in his 83s, possibly another cancer later in life, and died at 41.    Kim Duran father was diagnosed with prostate cancer last year at 20 and is doing well. All 6 of his brothers have also had prostate cancer, one passed from it. Patient's  paternal grandfather passed of metastatic prostate cancer in his 39s.    Kim Duran is aware of previous family history of genetic testing for hereditary cancer risks. There is no reported Ashkenazi Jewish ancestry. There is no known consanguinity.     GENETIC TEST RESULTS:  The [facility] Multi-Cancer+RNA Panel found no pathogenic mutations.   The Multi-Cancer + RNA Panel offered by [facility] includes sequencing and/or deletion/duplication analysis of the following 70 genes:  AIP*, ALK, APC*, ATM*, AXIN2*, BAP1*, BARD1*, BLM*, BMPR1A*, BRCA1*, BRCA2*, BRIP1*, CDC73*, CDH1*, CDK4, CDKN1B*, CDKN2A, CHEK2*, CTNNA1*, DICER1*, EPCAM, EGFR, FH*, FLCN*, GREM1, HOXB13, KIT, LZTR1, MAX*, MBD4, MEN1*, MET, MITF, MLH1*, MSH2*, MSH3*, MSH6*, MUTYH*, NF1*, NF2*, NTHL1*, PALB2*, PDGFRA, PMS2*, POLD1*, POLE*, POT1*, PRKAR1A*, PTCH1*, PTEN*, RAD51C*, RAD51D*, RB1*, RET, SDHA*, SDHAF2*, SDHB*, SDHC*, SDHD*, SMAD4*, SMARCA4*, SMARCB1*, SMARCE1*, STK11*, SUFU*, TMEM127*, TP53*, TSC1*, TSC2*, VHL*. RNA analysis is performed for * genes.   The test report has been scanned into EPIC and is located under the Molecular Pathology section of the Results Review tab.  A portion of the result report is included below for reference. Genetic testing reported out on 02/02/2023.      Genetic testing identified a variant of uncertain significance (VUS) in the BRCA2 gene.  At this time, it is unknown if this variant is associated with an increased risk for cancer or if it is benign,  but most uncertain variants are reclassified to benign. It should not be used to make medical management decisions. With time, we suspect the laboratory will determine the significance of this variant, if any. If the laboratory reclassifies this variant, we will attempt to contact Kim Duran to discuss it further.   [place]uran though a pathogenic variant was not identified, possible explanations for the cancer in the family may include: There may be no  hereditary risk for cancer in the family. The cancers in Kim Duran and/or her family may be sporadic/familial or due to other genetic and environmental factors. There may be a gene mutation in one of these genes that current testing methods cannot detect but that chance is small. There could be another gene that has not yet been discovered, or that we have not yet tested, that is responsible for the cancer diagnoses in the family.  It is also possible there is a hereditary cause for the cancer in the family that Kim Duran did not inherit.  Therefore, it is important to remain in touch with cancer genetics in the future so that we can continue to offer Kim Duran the most up to date genetic testing.   ADDITIONAL GENETIC TESTING:  We discussed with Kim Duran that her genetic testing was fairly extensive.  If there are additional relevant genes identified to increase cancer risk that can be analyzed in the future, we would be happy to discuss and coordinate this testing at that time.    CANCER SCREENING RECOMMENDATIONS:  Kim Duran test result is considered negative (normal).  This means that we have not identified a hereditary cause for her family history of cancer at this time.   An individual's cancer risk and medical management are not determined by genetic test results alone. Overall cancer risk assessment incorporates additional factors, including personal medical history, family history, and any available genetic information that may result in a personalized plan for cancer prevention and surveillance. Therefore, it is recommended she continue to follow the cancer management and screening guidelines provided by her primary healthcare provider.  Breast Cancer Screening:  Based on Kim Duran's personal and family history of cancer as well as her genetic test results, risk model Rockne Menghini was used to estimate her risk of developing breast cancer. This estimates her  lifetime risk of developing breast cancer to be approximately 14%.  The patient's lifetime breast cancer risk is a preliminary estimate based on available information using one of several models endorsed by the [employer] (NCCN). The NCCN recommends consideration of breast MRI screening as an adjunct to mammography for patients at high risk (defined as 20% or greater lifetime risk).  This risk estimate can change over time and may be repeated to reflect new information in her personal or family history in the future.    RECOMMENDATIONS FOR FAMILY MEMBERS:   Since she did not inherit a identifiable mutation in a cancer predisposition gene included on this panel, her children could not have inherited a known mutation from her in one of these genes. Individuals in this family might be at some increased risk of developing cancer, over the general population risk, due to the family history of cancer.  Individuals in the family should notify their providers of the family history of cancer. We recommend women in this family have a yearly mammogram beginning at age 32, or 48 years younger than the earliest onset of cancer, an annual clinical breast exam, and perform monthly breast self-exams.  Family members should have colonoscopies by at age 28, or earlier, as recommended by their providers. Other members of the family may still carry a pathogenic variant in one of these genes that Kim Duran did not inherit. Based on the family history, we recommend her maternal relatives have genetic testing. Kim Duran will let us know if we can be of any assistance in coordinating genetic counseling and/or testing for this family member.   We do not recommend familial testing for the BRCA2 variant of uncertain significance (VUS).  FOLLOW-UP:  Lastly, we discussed with Kim Duran that cancer genetics is a rapidly advancing field and it is possible that new genetic tests will be  appropriate for her and/or her family members in the future. We encouraged her to remain in contact with cancer genetics on an annual basis so we can update her personal and family histories and let her know of advances in cancer genetics that may benefit this family.   Our contact number was provided. Kim Duran questions were answered to her satisfaction, and she knows she is welcome to call us at anytime with additional questions or concerns.    Lacy Duverney, MS, [facility] Genetic Counselor [email or web address] Phone: [phone]

## 2023-02-02 NOTE — Telephone Encounter (Signed)
I contacted Ms. Fogg to discuss her genetic testing results. No pathogenic variants were identified in the 70 genes analyzed. Detailed clinic note to follow.   The test report has been scanned into EPIC and is located under the Molecular Pathology section of the Results Review tab.  A portion of the result report is included below for reference.      Lacy Duverney, MS, [facility] Genetic Counselor [email or web address] Phone: [phone]

## 2023-03-18 ENCOUNTER — Inpatient Hospital Stay: Payer: BC Managed Care – PPO | Attending: Oncology

## 2023-03-18 DIAGNOSIS — Z5986 Financial insecurity: Secondary | ICD-10-CM | POA: Insufficient documentation

## 2023-03-18 DIAGNOSIS — Z833 Family history of diabetes mellitus: Secondary | ICD-10-CM | POA: Insufficient documentation

## 2023-03-18 DIAGNOSIS — Z90721 Acquired absence of ovaries, unilateral: Secondary | ICD-10-CM | POA: Insufficient documentation

## 2023-03-18 DIAGNOSIS — R5383 Other fatigue: Secondary | ICD-10-CM | POA: Insufficient documentation

## 2023-03-18 DIAGNOSIS — Z79899 Other long term (current) drug therapy: Secondary | ICD-10-CM | POA: Insufficient documentation

## 2023-03-18 DIAGNOSIS — Z8042 Family history of malignant neoplasm of prostate: Secondary | ICD-10-CM | POA: Insufficient documentation

## 2023-03-18 DIAGNOSIS — Z8249 Family history of ischemic heart disease and other diseases of the circulatory system: Secondary | ICD-10-CM | POA: Insufficient documentation

## 2023-03-18 DIAGNOSIS — Z800 Family history of malignant neoplasm of digestive organs: Secondary | ICD-10-CM | POA: Insufficient documentation

## 2023-03-18 DIAGNOSIS — D509 Iron deficiency anemia, unspecified: Secondary | ICD-10-CM | POA: Insufficient documentation

## 2023-03-18 DIAGNOSIS — I10 Essential (primary) hypertension: Secondary | ICD-10-CM | POA: Insufficient documentation

## 2023-03-18 DIAGNOSIS — Z803 Family history of malignant neoplasm of breast: Secondary | ICD-10-CM | POA: Insufficient documentation

## 2023-03-21 ENCOUNTER — Other Ambulatory Visit: Payer: Self-pay

## 2023-03-21 DIAGNOSIS — D649 Anemia, unspecified: Secondary | ICD-10-CM

## 2023-03-22 ENCOUNTER — Inpatient Hospital Stay: Payer: BC Managed Care – PPO

## 2023-03-22 ENCOUNTER — Inpatient Hospital Stay: Payer: BC Managed Care – PPO | Admitting: Oncology

## 2023-03-22 ENCOUNTER — Encounter: Payer: Self-pay | Admitting: Oncology

## 2023-03-31 ENCOUNTER — Encounter: Payer: Self-pay | Admitting: Women's Health

## 2023-03-31 ENCOUNTER — Other Ambulatory Visit (HOSPITAL_COMMUNITY)
Admission: RE | Admit: 2023-03-31 | Discharge: 2023-03-31 | Disposition: A | Discharge status: Home / Self Care | Payer: BC Managed Care – PPO | Source: Ambulatory Visit | Attending: Women's Health | Admitting: Women's Health

## 2023-03-31 ENCOUNTER — Ambulatory Visit (INDEPENDENT_AMBULATORY_CARE_PROVIDER_SITE_OTHER): Payer: BC Managed Care – PPO | Admitting: Women's Health

## 2023-03-31 VITALS — BP 129/89 | HR 86 | Ht 64.0 in | Wt 261.4 lb

## 2023-03-31 DIAGNOSIS — F419 Anxiety disorder, unspecified: Secondary | ICD-10-CM

## 2023-03-31 DIAGNOSIS — I10 Essential (primary) hypertension: Secondary | ICD-10-CM

## 2023-03-31 DIAGNOSIS — F633 Trichotillomania: Secondary | ICD-10-CM | POA: Diagnosis not present

## 2023-03-31 DIAGNOSIS — N898 Other specified noninflammatory disorders of vagina: Secondary | ICD-10-CM | POA: Diagnosis present

## 2023-03-31 DIAGNOSIS — N920 Excessive and frequent menstruation with regular cycle: Secondary | ICD-10-CM

## 2023-03-31 MED ORDER — CITALOPRAM HYDROBROMIDE 20 MG PO TABS: 20.0000 mg | ORAL_TABLET | Freq: Every day | ORAL | 3 refills | Status: AC

## 2023-03-31 MED ORDER — AMLODIPINE BESYLATE 10 MG PO TABS: 10.0000 mg | ORAL_TABLET | Freq: Every day | ORAL | 3 refills | Status: DC

## 2023-03-31 NOTE — Patient Instructions (Addendum)
Make an appointment with [facility] (for anxiety/meds).   Make an appointment with a Primary Care Doctor as soon as possible Primary Care Providers Dr. Dwana Melena [place]) [phone] [facility] [phone] [facility] [phone] [facility] [place]) [phone] [facility]) [phone] [facility]) [phone] [facility] [phone] [facility] [phone]

## 2023-03-31 NOTE — Progress Notes (Signed)
GYN VISIT Patient name: Kim Duran MRN [id number]  Date of birth: Aug 11, 1985 Chief Complaint:   Medication Refill (Blood pressure med and anxiety medication refills)  History of Present Illness:   Kim Duran is a 37 y.o. [phone] African-American female being seen today to get bp and anxiety meds refilled.  Not taking norvasc 5mg  consistently, only takes when feels like bp is up and checks it, has been getting 150s/100s, sometimes will take 2 pills. Last dose last night, 10mg . On celexa 20mg , doesn't take it consistently either. Does have condition where she pulls her hair out since 2nd grade, read about a medicine that may help it and anxiety. Not currently in counseling/therapy, but is interested. Feels like vaginal pH may be off, smells odor, but not fishy. No itching/odor/irritation. Does not have PCP.  Patient's last menstrual period was 03/06/2023 (exact date). The current method of family planning is coitus interruptus and rhythm method. Has heavy periods, we have discussed options in past, still not sure what she wants, will discuss further w/ husband and let us know. Last pap 09/22/21. Results were: NILM w/ HRHPV negative     03/31/2023    2:55 PM 10/20/2021    2:55 PM 09/22/2021    3:25 PM 04/01/2020   10:58 AM 03/03/2020    9:49 AM  Depression screen PHQ 2/9  Decreased Interest 3 1 2 2 3   Down, Depressed, Hopeless 1 1 2 2 3   PHQ - 2 Score 4 2 4 4 6   Altered sleeping 1 1 2 2 2   Tired, decreased energy 2 1 3 2 3   Change in appetite 3 2 1 2 3   Feeling bad or failure about yourself  2 1 1 2 3   Trouble concentrating 2 2 1 3 3   Moving slowly or fidgety/restless 2 2 0 0 0  Suicidal thoughts 0 0 0 0 0  PHQ-9 Score 16 11 12 15 20   Difficult doing work/chores     Very difficult        03/31/2023    2:56 PM 10/20/2021    2:56 PM 09/22/2021    3:26 PM 03/03/2020    9:55 AM  GAD 7 : Generalized Anxiety Score  Nervous, Anxious, on Edge 2 2 3 3   Control/stop worrying 2 1  2 3   Worry too much - different things 3 1 3 3   Trouble relaxing 3 2 3 3   Restless 3 2 3 3   Easily annoyed or irritable 2 2 3 3   Afraid - awful might happen 1 1 1 2   Total GAD 7 Score 16 11 18 20   Anxiety Difficulty    Somewhat difficult     Review of Systems:   Pertinent items are noted in HPI Denies fever/chills, dizziness, headaches, visual disturbances, fatigue, shortness of breath, chest pain, abdominal pain, vomiting, abnormal vaginal discharge/itching/odor/irritation, problems with periods, bowel movements, urination, or intercourse unless otherwise stated above.  Pertinent History Reviewed:  Reviewed past medical,surgical, social, obstetrical and family history.  Reviewed problem list, medications and allergies. Physical Assessment:   Vitals:   03/31/23 1444 03/31/23 1448  BP: (!) 155/108 129/89  Pulse: 93 86  Weight: 261 lb 6.4 oz (118.6 kg)   Height: 5\' 4"  (1.626 m)   Body mass index is 44.87 kg/m.       Physical Examination:   General appearance: alert, well appearing, and in no distress  Mental status: alert, oriented to person, place, and time  Skin: warm & dry  Cardiovascular: normal heart rate noted  Respiratory: normal respiratory effort, no distress  Abdomen: soft, non-tender   Pelvic: examination not indicated  Extremities: no edema   Chaperone: N/A    No results found for this or any previous visit (from the past 24 hour(s)).  Assessment & Plan:  1) CHTN> currently not well controlled, increase norvasc to 10mg , take consistently every day. F/U 2wks for bp check w/ nurse. Make sure to take norvasc at least 1hr before appt. Make appt w/ PCP asap to start managing this. List of PCPs given.   2) Anxiety w/ hair pulling> currently on celexa 20mg , refilled today. Take daily. Scanned QR code for [facility] to start therapy and discuss medicine for hair pulling. Let me know if any issues w/ this.   3) Vaginal odor> self collected CV swab today  4) Menorrhagia  w/ regular cycles> discussed options, will let us know if  Meds:  Meds ordered this encounter  Medications   amLODipine (NORVASC) 10 MG tablet    Sig: Take 1 tablet (10 mg total) by mouth daily.    Dispense:  90 tablet    Refill:  3   citalopram (CELEXA) 20 MG tablet    Sig: Take 1 tablet (20 mg total) by mouth daily.    Dispense:  90 tablet    Refill:  3    No orders of the defined types were placed in this encounter.   Return in about 2 weeks (around 04/14/2023) for bp check w/ nurse (take noravsc at least 1hr before visit).  Cheral Marker CNM, [facility] 03/31/2023 3:22 PM

## 2023-04-04 LAB — CERVICOVAGINAL ANCILLARY ONLY
Bacterial Vaginitis (gardnerella): NEGATIVE
Candida Glabrata: NEGATIVE
Candida Vaginitis: NEGATIVE
Chlamydia: NEGATIVE
Comment: NEGATIVE
Comment: NEGATIVE
Comment: NEGATIVE
Comment: NEGATIVE
Comment: NEGATIVE
Comment: NORMAL
Neisseria Gonorrhea: NEGATIVE
Trichomonas: NEGATIVE

## 2023-04-11 ENCOUNTER — Inpatient Hospital Stay: Payer: BC Managed Care – PPO | Admitting: Oncology

## 2023-04-11 ENCOUNTER — Encounter: Payer: Self-pay | Admitting: Oncology

## 2023-04-11 ENCOUNTER — Inpatient Hospital Stay (HOSPITAL_BASED_OUTPATIENT_CLINIC_OR_DEPARTMENT_OTHER): Payer: BC Managed Care – PPO | Admitting: Oncology

## 2023-04-11 ENCOUNTER — Inpatient Hospital Stay: Payer: BC Managed Care – PPO

## 2023-04-11 DIAGNOSIS — Z8249 Family history of ischemic heart disease and other diseases of the circulatory system: Secondary | ICD-10-CM | POA: Diagnosis not present

## 2023-04-11 DIAGNOSIS — Z90721 Acquired absence of ovaries, unilateral: Secondary | ICD-10-CM | POA: Diagnosis not present

## 2023-04-11 DIAGNOSIS — D500 Iron deficiency anemia secondary to blood loss (chronic): Secondary | ICD-10-CM

## 2023-04-11 DIAGNOSIS — Z803 Family history of malignant neoplasm of breast: Secondary | ICD-10-CM | POA: Diagnosis not present

## 2023-04-11 DIAGNOSIS — Z79899 Other long term (current) drug therapy: Secondary | ICD-10-CM | POA: Diagnosis not present

## 2023-04-11 DIAGNOSIS — Z833 Family history of diabetes mellitus: Secondary | ICD-10-CM | POA: Diagnosis not present

## 2023-04-11 DIAGNOSIS — D509 Iron deficiency anemia, unspecified: Secondary | ICD-10-CM

## 2023-04-11 DIAGNOSIS — R5383 Other fatigue: Secondary | ICD-10-CM | POA: Diagnosis not present

## 2023-04-11 DIAGNOSIS — I10 Essential (primary) hypertension: Secondary | ICD-10-CM | POA: Diagnosis not present

## 2023-04-11 DIAGNOSIS — Z5986 Financial insecurity: Secondary | ICD-10-CM | POA: Diagnosis not present

## 2023-04-11 DIAGNOSIS — Z8042 Family history of malignant neoplasm of prostate: Secondary | ICD-10-CM | POA: Diagnosis not present

## 2023-04-11 DIAGNOSIS — D649 Anemia, unspecified: Secondary | ICD-10-CM

## 2023-04-11 DIAGNOSIS — Z800 Family history of malignant neoplasm of digestive organs: Secondary | ICD-10-CM | POA: Diagnosis not present

## 2023-04-11 LAB — CBC WITH DIFFERENTIAL (CANCER CENTER ONLY)
Abs Immature Granulocytes: 0.03 10*3/uL (ref 0.00–0.07)
Basophils Absolute: 0 10*3/uL (ref 0.0–0.1)
Basophils Relative: 1 %
Eosinophils Absolute: 0.2 10*3/uL (ref 0.0–0.5)
Eosinophils Relative: 2 %
HCT: 33.2 % — ABNORMAL LOW (ref 36.0–46.0)
Hemoglobin: 10.3 g/dL — ABNORMAL LOW (ref 12.0–15.0)
Immature Granulocytes: 0 %
Lymphocytes Relative: 17 %
Lymphs Abs: 1.4 10*3/uL (ref 0.7–4.0)
MCH: 21.4 pg — ABNORMAL LOW (ref 26.0–34.0)
MCHC: 31 g/dL (ref 30.0–36.0)
MCV: 68.9 fL — ABNORMAL LOW (ref 80.0–100.0)
Monocytes Absolute: 0.7 10*3/uL (ref 0.1–1.0)
Monocytes Relative: 8 %
Neutro Abs: 5.9 10*3/uL (ref 1.7–7.7)
Neutrophils Relative %: 72 %
Platelet Count: 419 10*3/uL — ABNORMAL HIGH (ref 150–400)
RBC: 4.82 MIL/uL (ref 3.87–5.11)
RDW: 19.5 % — ABNORMAL HIGH (ref 11.5–15.5)
WBC Count: 8.1 10*3/uL (ref 4.0–10.5)
nRBC: 0 % (ref 0.0–0.2)

## 2023-04-11 LAB — IRON AND TIBC
Iron: 31 ug/dL (ref 28–170)
Saturation Ratios: 7 % — ABNORMAL LOW (ref 10.4–31.8)
TIBC: 449 ug/dL (ref 250–450)
UIBC: 418 ug/dL

## 2023-04-11 LAB — FERRITIN: Ferritin: 4 ng/mL — ABNORMAL LOW (ref 11–307)

## 2023-04-11 NOTE — Progress Notes (Signed)
HEMATOLOGY-ONCOLOGY TeleHEALTH VISIT PROGRESS NOTE  I connected with Kim Duran on 04/11/23  at  3:00 PM EDT by video enabled telemedicine visit and verified that I am speaking with the correct person using two identifiers. I discussed the limitations, risks, security and privacy concerns of performing an evaluation and management service by telemedicine and the availability of in-person appointments. The patient expressed understanding and agreed to proceed.   Other persons participating in the visit and their role in the encounter:  None  Patient's location: Patient is in her vehicle.  Not driving. Provider's location: office Chief Complaint: Iron deficiency anemia   INTERVAL HISTORY Kim Duran is a 37 y.o. female who has above history reviewed by me today presents for follow up visit for management of iron deficiency anemia Patient reports feeling more tired.  She is not currently taking oral iron supplementation.  Review of Systems  Constitutional:  Positive for fatigue. Negative for appetite change, chills and fever.  HENT:   Negative for hearing loss and voice change.   Eyes:  Negative for eye problems.  Respiratory:  Negative for chest tightness and cough.   Cardiovascular:  Negative for chest pain.  Gastrointestinal:  Negative for abdominal distention, abdominal pain and blood in stool.  Endocrine: Negative for hot flashes.  Genitourinary:  Negative for difficulty urinating and frequency.   Musculoskeletal:  Negative for arthralgias.  Skin:  Negative for itching and rash.  Neurological:  Negative for extremity weakness.  Hematological:  Negative for adenopathy.  Psychiatric/Behavioral:  Negative for confusion.     Past Medical History:  Diagnosis Date   Anemia    Hypertension    Medical history non-contributory    Past Surgical History:  Procedure Laterality Date   CESAREAN SECTION     CESAREAN SECTION N/A 01/23/2018   Procedure: CESAREAN SECTION;   Surgeon: Conan Bowens, MD;  Location: [facility]IRTH[facility] SU[facility];  Service: Obstetrics;  Laterality: N/A;   left ovary and tube removed  08/16/2009   10lb tumor "wrapped around ovary"     Family History  Problem Relation Age of Onset   Prostate cancer Paternal Grandfather        metastatic   Pancreatic cancer Maternal Grandmother 72   Prostate cancer Maternal Grandfather        dx 43s   Cancer Father    Hypertension Father    Hypertension Mother    Diabetes Mother        pre-diabetes   BRCA 1/2 Sister    Premature birth Daughter        born at 21 weeks   Breast cancer Maternal Aunt 47   Breast cancer Maternal Aunt 50   Diabetes Paternal Aunt    Prostate cancer Paternal Uncle        all 6 uncles with prostate cancer    Social History   Socioeconomic History   Marital status: Married    Spouse name: Not on file   Number of children: 5   Years of education: Not on file   Highest education level: Not on file  Occupational History   Not on file  Tobacco Use   Smoking status: Never   Smokeless tobacco: Never  Vaping Use   Vaping status: Never Used  Substance and Sexual Activity   Alcohol use: No   Drug use: No   Sexual activity: Yes    Birth control/protection: None  Other Topics Concern   Not on file  Social History Narrative   Not on  file   Social Determinants of Health   Financial Resource Strain: Medium Risk (09/22/2021)   Overall Financial Resource Strain (CARDIA)    Difficulty of Paying Living Expenses: Somewhat hard  Food Insecurity: Food Insecurity Present (09/22/2021)   Hunger Vital Sign    Worried About Running Out of Food in the Last Year: Sometimes true    Ran Out of Food in the Last Year: Sometimes true  Transportation Needs: No Transportation Needs (09/22/2021)   PRAPARE - Administrator, Civil Service (Medical): No    Lack of Transportation (Non-Medical): No  Physical Activity: Insufficiently Active (09/22/2021)   Exercise Vital Sign    Days  of Exercise per Week: 2 days    Minutes of Exercise per Session: 30 min  Stress: Stress Concern Present (09/22/2021)   [employer] of Occupational Health - Occupational Stress Questionnaire    Feeling of Stress : Very much  Social Connections: Socially Integrated (09/22/2021)   Social Connection and Isolation Panel [NHANES]    Frequency of Communication with Friends and Family: More than three times a week    Frequency of Social Gatherings with Friends and Family: Never    Attends Religious Services: More than 4 times per year    Active Member of [employer] or Organizations: Yes    Attends Banker Meetings: Never    Marital Status: Married  Catering manager Violence: Not At Risk (09/22/2021)   Humiliation, Afraid, Rape, and Kick questionnaire    Fear of Current or Ex-Partner: No    Emotionally Abused: No    Physically Abused: No    Sexually Abused: No    Current Outpatient Medications on File Prior to Visit  Medication Sig Dispense Refill   amLODipine (NORVASC) 10 MG tablet Take 1 tablet (10 mg total) by mouth daily. 90 tablet 3   citalopram (CELEXA) 20 MG tablet Take 1 tablet (20 mg total) by mouth daily. 90 tablet 3   Cyanocobalamin (VITAMIN B-12 PO) Take by mouth.     Multiple Vitamin (MULTIVITAMIN) tablet Take 1 tablet by mouth daily.     ibuprofen (ADVIL) 800 MG tablet Take 1 tablet (800 mg total) by mouth every 8 (eight) hours as needed. (Patient not taking: Reported on 04/11/2023) 21 tablet 0   methocarbamol (ROBAXIN) 500 MG tablet Take 1 tablet (500 mg total) by mouth 2 (two) times daily as needed for muscle spasms. (Patient not taking: Reported on 04/11/2023) 20 tablet 0   No current facility-administered medications on file prior to visit.    No Known Allergies     Observations/Objective: There were no vitals filed for this visit. There is no height or weight on file to calculate BMI.  Physical Exam Neurological:     Mental Status: She is alert.      Labs    Latest Ref Rng & Units 04/11/2023    9:26 AM 11/01/2022    1:22 PM 06/25/2022    1:07 PM  CBC  WBC 4.0 - 10.5 K/uL 8.1  7.7  5.6   Hemoglobin 12.0 - 15.0 g/dL 29.5  62.1  30.8   Hematocrit 36.0 - 46.0 % 33.2  37.2  33.1   Platelets 150 - 400 K/uL 419  402  421    Lab Results  Component Value Date   HGB 10.3 (L) 04/11/2023   TIBC 449 04/11/2023   IRONPCTSAT 7 (L) 04/11/2023   FERRITIN 4 (L) 04/11/2023      ASSESSMENT & PLAN:   IDA (  iron deficiency anemia) Labs reviewed and discussed with patient. Hemoglobin and iron panel have improved.   Lab Results  Component Value Date   IRON 31 04/11/2023   TIBC 449 04/11/2023   IRONPCTSAT 7 (L) 04/11/2023   FERRITIN 4 (L) 04/11/2023    Recommend patient to get IV Venofer weekly x 4 Patient declines pregnancy testing.   Orders Placed This Encounter  Procedures   CBC with Differential (Cancer Center Only)    Standing Status:   Future    Standing Expiration Date:   04/10/2024   Iron and TIBC    Standing Status:   Future    Standing Expiration Date:   04/10/2024   Ferritin    Standing Status:   Future    Standing Expiration Date:   04/10/2024   I discussed the assessment and treatment plan with the patient. The patient was provided an opportunity to ask questions and all were answered. The patient agreed with the plan and demonstrated an understanding of the instructions.  The patient was advised to call back or seek an in-person evaluation if the symptoms worsen or if the condition fails to improve as anticipated.   Follow-up in 4 months. Rickard Patience, MD 04/11/2023 8:55 PM

## 2023-04-11 NOTE — Progress Notes (Signed)
Pt contacted for Mychart visit. No new concerns voiced.  

## 2023-04-11 NOTE — Assessment & Plan Note (Signed)
Labs reviewed and discussed with patient. Hemoglobin and iron panel have improved.   Lab Results  Component Value Date   IRON 59 11/01/2022   TIBC 431 11/01/2022   IRONPCTSAT 14 11/01/2022   FERRITIN 12 11/01/2022    Hold off IV Venofer.  Recommend patient to take Niferrex 150mg  daily. She may also take her multivitamin daily.

## 2023-04-11 NOTE — Assessment & Plan Note (Signed)
Labs reviewed and discussed with patient. Hemoglobin and iron panel have improved.   Lab Results  Component Value Date   IRON 31 04/11/2023   TIBC 449 04/11/2023   IRONPCTSAT 7 (L) 04/11/2023   FERRITIN 4 (L) 04/11/2023    Recommend patient to get IV Venofer weekly x 4 Patient declines pregnancy testing.

## 2023-04-12 ENCOUNTER — Encounter: Payer: Self-pay | Admitting: Oncology

## 2023-04-12 NOTE — Progress Notes (Signed)
error 

## 2023-04-15 ENCOUNTER — Inpatient Hospital Stay: Payer: BC Managed Care – PPO

## 2023-04-21 ENCOUNTER — Ambulatory Visit: Payer: BC Managed Care – PPO

## 2023-04-22 ENCOUNTER — Inpatient Hospital Stay: Payer: BC Managed Care – PPO | Attending: Oncology

## 2023-04-22 VITALS — BP 143/88 | HR 92 | Temp 97.1°F | Resp 18

## 2023-04-22 DIAGNOSIS — D509 Iron deficiency anemia, unspecified: Secondary | ICD-10-CM | POA: Insufficient documentation

## 2023-04-22 DIAGNOSIS — D75839 Thrombocytosis, unspecified: Secondary | ICD-10-CM | POA: Insufficient documentation

## 2023-04-22 DIAGNOSIS — Z79899 Other long term (current) drug therapy: Secondary | ICD-10-CM | POA: Insufficient documentation

## 2023-04-22 DIAGNOSIS — D500 Iron deficiency anemia secondary to blood loss (chronic): Secondary | ICD-10-CM

## 2023-04-22 MED ORDER — SODIUM CHLORIDE 0.9 % IV SOLN
INTRAVENOUS | Status: DC
  Filled 2023-04-22: qty 250

## 2023-04-22 MED ORDER — SODIUM CHLORIDE 0.9 % IV SOLN
200.0000 mg | Freq: Once | INTRAVENOUS | Status: AC
  Administered 2023-04-22: 200 mg via INTRAVENOUS
  Filled 2023-04-22: qty 200
  Filled 2023-04-22: qty 10

## 2023-04-22 NOTE — Patient Instructions (Signed)
Iron Sucrose Injection What is this medication? IRON SUCROSE (EYE ern SOO krose) treats low levels of iron (iron deficiency anemia) in people with kidney disease. Iron is a mineral that plays an important role in making red blood cells, which carry oxygen from your lungs to the rest of your body. This medicine may be used for other purposes; ask your health care provider or pharmacist if you have questions. COMMON BRAND NAME(S): Venofer What should I tell my care team before I take this medication? They need to know if you have any of these conditions: Anemia not caused by low iron levels Heart disease High levels of iron in the blood Kidney disease Liver disease An unusual or allergic reaction to iron, other medications, foods, dyes, or preservatives Pregnant or trying to get pregnant Breastfeeding How should I use this medication? This medication is for infusion into a vein. It is given in a hospital or clinic setting. Talk to your care team about the use of this medication in children. While this medication may be prescribed for children as young as 2 years for selected conditions, precautions do apply. Overdosage: If you think you have taken too much of this medicine contact a poison control center or emergency room at once. NOTE: This medicine is only for you. Do not share this medicine with others. What if I miss a dose? Keep appointments for follow-up doses. It is important not to miss your dose. Call your care team if you are unable to keep an appointment. What may interact with this medication? Do not take this medication with any of the following: Deferoxamine Dimercaprol Other iron products This medication may also interact with the following: Chloramphenicol Deferasirox This list may not describe all possible interactions. Give your health care provider a list of all the medicines, herbs, non-prescription drugs, or dietary supplements you use. Also tell them if you smoke,  drink alcohol, or use illegal drugs. Some items may interact with your medicine. What should I watch for while using this medication? Visit your care team regularly. Tell your care team if your symptoms do not start to get better or if they get worse. You may need blood work done while you are taking this medication. You may need to follow a special diet. Talk to your care team. Foods that contain iron include: whole grains/cereals, dried fruits, beans, or peas, leafy green vegetables, and organ meats (liver, kidney). What side effects may I notice from receiving this medication? Side effects that you should report to your care team as soon as possible: Allergic reactions--skin rash, itching, hives, swelling of the face, lips, tongue, or throat Low blood pressure--dizziness, feeling faint or lightheaded, blurry vision Shortness of breath Side effects that usually do not require medical attention (report to your care team if they continue or are bothersome): Flushing Headache Joint pain Muscle pain Nausea Pain, redness, or irritation at injection site This list may not describe all possible side effects. Call your doctor for medical advice about side effects. You may report side effects to FDA at 1-800-FDA-1088. Where should I keep my medication? This medication is given in a hospital or clinic. It will not be stored at home. NOTE: This sheet is a summary. It may not cover all possible information. If you have questions about this medicine, talk to your doctor, pharmacist, or health care provider.  2024 Elsevier/Gold Standard (2022-12-03 00:00:00)

## 2023-04-24 ENCOUNTER — Encounter: Payer: Self-pay | Admitting: Oncology

## 2023-04-25 ENCOUNTER — Other Ambulatory Visit: Payer: Self-pay

## 2023-04-25 DIAGNOSIS — D500 Iron deficiency anemia secondary to blood loss (chronic): Secondary | ICD-10-CM

## 2023-04-25 NOTE — Telephone Encounter (Signed)
Please schedule lab on 10/18 prior to infusion. Pt aware that she needs to come in earlier.

## 2023-04-26 ENCOUNTER — Ambulatory Visit: Payer: BC Managed Care – PPO | Admitting: Oncology

## 2023-04-29 ENCOUNTER — Inpatient Hospital Stay: Payer: BC Managed Care – PPO

## 2023-04-29 ENCOUNTER — Ambulatory Visit: Payer: BC Managed Care – PPO

## 2023-05-06 ENCOUNTER — Inpatient Hospital Stay: Payer: BC Managed Care – PPO

## 2023-05-06 VITALS — BP 127/96 | HR 82 | Temp 97.8°F | Resp 18

## 2023-05-06 DIAGNOSIS — D500 Iron deficiency anemia secondary to blood loss (chronic): Secondary | ICD-10-CM

## 2023-05-06 DIAGNOSIS — D509 Iron deficiency anemia, unspecified: Secondary | ICD-10-CM | POA: Diagnosis not present

## 2023-05-06 LAB — VITAMIN B12: Vitamin B-12: 658 pg/mL (ref 180–914)

## 2023-05-06 MED ORDER — SODIUM CHLORIDE 0.9% FLUSH
3.0000 mL | Freq: Once | INTRAVENOUS | Status: DC | PRN
Start: 2023-05-06 — End: 2023-05-06
  Filled 2023-05-06: qty 3

## 2023-05-06 MED ORDER — IRON SUCROSE 20 MG/ML IV SOLN
200.0000 mg | Freq: Once | INTRAVENOUS | Status: AC
  Administered 2023-05-06: 200 mg via INTRAVENOUS
  Filled 2023-05-06: qty 10

## 2023-05-06 MED ORDER — SODIUM CHLORIDE 0.9% FLUSH
10.0000 mL | Freq: Once | INTRAVENOUS | Status: AC | PRN
  Administered 2023-05-06: 10 mL
  Filled 2023-05-06: qty 10

## 2023-05-09 ENCOUNTER — Encounter: Payer: Self-pay | Admitting: Oncology

## 2023-05-10 ENCOUNTER — Other Ambulatory Visit: Payer: Self-pay

## 2023-05-10 DIAGNOSIS — D500 Iron deficiency anemia secondary to blood loss (chronic): Secondary | ICD-10-CM

## 2023-05-13 ENCOUNTER — Inpatient Hospital Stay: Payer: BC Managed Care – PPO | Attending: Oncology

## 2023-05-20 ENCOUNTER — Inpatient Hospital Stay: Payer: BC Managed Care – PPO

## 2023-08-11 ENCOUNTER — Inpatient Hospital Stay: Payer: BC Managed Care – PPO

## 2023-08-11 ENCOUNTER — Inpatient Hospital Stay: Payer: BC Managed Care – PPO | Attending: Oncology

## 2023-08-11 DIAGNOSIS — Z79899 Other long term (current) drug therapy: Secondary | ICD-10-CM | POA: Insufficient documentation

## 2023-08-11 DIAGNOSIS — D500 Iron deficiency anemia secondary to blood loss (chronic): Secondary | ICD-10-CM

## 2023-08-11 DIAGNOSIS — D509 Iron deficiency anemia, unspecified: Secondary | ICD-10-CM | POA: Diagnosis present

## 2023-08-11 LAB — CBC WITH DIFFERENTIAL (CANCER CENTER ONLY)
Abs Immature Granulocytes: 0.03 10*3/uL (ref 0.00–0.07)
Basophils Absolute: 0 10*3/uL (ref 0.0–0.1)
Basophils Relative: 1 %
Eosinophils Absolute: 0.2 10*3/uL (ref 0.0–0.5)
Eosinophils Relative: 4 %
HCT: 31.2 % — ABNORMAL LOW (ref 36.0–46.0)
Hemoglobin: 9.9 g/dL — ABNORMAL LOW (ref 12.0–15.0)
Immature Granulocytes: 0 %
Lymphocytes Relative: 21 %
Lymphs Abs: 1.5 10*3/uL (ref 0.7–4.0)
MCH: 22 pg — ABNORMAL LOW (ref 26.0–34.0)
MCHC: 31.7 g/dL (ref 30.0–36.0)
MCV: 69.2 fL — ABNORMAL LOW (ref 80.0–100.0)
Monocytes Absolute: 0.8 10*3/uL (ref 0.1–1.0)
Monocytes Relative: 11 %
Neutro Abs: 4.4 10*3/uL (ref 1.7–7.7)
Neutrophils Relative %: 63 %
Platelet Count: 437 10*3/uL — ABNORMAL HIGH (ref 150–400)
RBC: 4.51 MIL/uL (ref 3.87–5.11)
RDW: 19.9 % — ABNORMAL HIGH (ref 11.5–15.5)
WBC Count: 6.9 10*3/uL (ref 4.0–10.5)
nRBC: 0 % (ref 0.0–0.2)

## 2023-08-11 LAB — IRON AND TIBC
Iron: 18 ug/dL — ABNORMAL LOW (ref 28–170)
Saturation Ratios: 4 % — ABNORMAL LOW (ref 10.4–31.8)
TIBC: 427 ug/dL (ref 250–450)
UIBC: 409 ug/dL

## 2023-08-11 LAB — RETIC PANEL
Immature Retic Fract: 19.6 % — ABNORMAL HIGH (ref 2.3–15.9)
RBC.: 4.54 MIL/uL (ref 3.87–5.11)
Retic Count, Absolute: 69 10*3/uL (ref 19.0–186.0)
Retic Ct Pct: 1.5 % (ref 0.4–3.1)
Reticulocyte Hemoglobin: 22 pg — ABNORMAL LOW (ref >27.9)

## 2023-08-11 LAB — FERRITIN: Ferritin: 4 ng/mL — ABNORMAL LOW (ref 11–307)

## 2023-08-15 ENCOUNTER — Inpatient Hospital Stay: Payer: BC Managed Care – PPO | Attending: Oncology | Admitting: Oncology

## 2023-08-15 ENCOUNTER — Encounter: Payer: Self-pay | Admitting: Oncology

## 2023-08-15 DIAGNOSIS — Z803 Family history of malignant neoplasm of breast: Secondary | ICD-10-CM | POA: Insufficient documentation

## 2023-08-15 DIAGNOSIS — Z79899 Other long term (current) drug therapy: Secondary | ICD-10-CM | POA: Insufficient documentation

## 2023-08-15 DIAGNOSIS — D509 Iron deficiency anemia, unspecified: Secondary | ICD-10-CM | POA: Diagnosis present

## 2023-08-15 DIAGNOSIS — D500 Iron deficiency anemia secondary to blood loss (chronic): Secondary | ICD-10-CM

## 2023-08-15 DIAGNOSIS — Z800 Family history of malignant neoplasm of digestive organs: Secondary | ICD-10-CM | POA: Insufficient documentation

## 2023-08-15 DIAGNOSIS — R5383 Other fatigue: Secondary | ICD-10-CM | POA: Diagnosis not present

## 2023-08-15 DIAGNOSIS — Z8042 Family history of malignant neoplasm of prostate: Secondary | ICD-10-CM | POA: Diagnosis not present

## 2023-08-15 DIAGNOSIS — Z5986 Financial insecurity: Secondary | ICD-10-CM | POA: Diagnosis not present

## 2023-08-15 DIAGNOSIS — Z90721 Acquired absence of ovaries, unilateral: Secondary | ICD-10-CM | POA: Insufficient documentation

## 2023-08-15 DIAGNOSIS — Z8249 Family history of ischemic heart disease and other diseases of the circulatory system: Secondary | ICD-10-CM | POA: Diagnosis not present

## 2023-08-15 DIAGNOSIS — Z833 Family history of diabetes mellitus: Secondary | ICD-10-CM | POA: Diagnosis not present

## 2023-08-15 DIAGNOSIS — Z809 Family history of malignant neoplasm, unspecified: Secondary | ICD-10-CM | POA: Diagnosis not present

## 2023-08-15 NOTE — Progress Notes (Signed)
HEMATOLOGY-ONCOLOGY TeleHEALTH VISIT PROGRESS NOTE  I connected with Kim Duran on 08/15/23  at  2:45 PM EST by video enabled telemedicine visit and verified that I am speaking with the correct person using two identifiers. I discussed the limitations, risks, security and privacy concerns of performing an evaluation and management service by telemedicine and the availability of in-person appointments. The patient expressed understanding and agreed to proceed.   Other persons participating in the visit and their role in the encounter:  None  Patient's location: Patient is in her vehicle.  Not driving. Provider's location: office Chief Complaint: Iron deficiency anemia   INTERVAL HISTORY Kim Duran is a 38 y.o. female who has above history reviewed by me today presents for follow up visit for management of iron deficiency anemia Patient reports feeling more tired.  She reports feeling very tried after receiving IV push Venofer. She did not have similar side effect in the past when Venofer was given as infusion.   Review of Systems  Constitutional:  Positive for fatigue. Negative for appetite change, chills and fever.  HENT:   Negative for hearing loss and voice change.   Eyes:  Negative for eye problems.  Respiratory:  Negative for chest tightness and cough.   Cardiovascular:  Negative for chest pain.  Gastrointestinal:  Negative for abdominal distention, abdominal pain and blood in stool.  Endocrine: Negative for hot flashes.  Genitourinary:  Negative for difficulty urinating and frequency.   Musculoskeletal:  Negative for arthralgias.  Skin:  Negative for itching and rash.  Neurological:  Negative for extremity weakness.  Hematological:  Negative for adenopathy.  Psychiatric/Behavioral:  Negative for confusion.     Past Medical History:  Diagnosis Date   Anemia    Hypertension    Medical history non-contributory    Past Surgical History:  Procedure Laterality  Date   CESAREAN SECTION     CESAREAN SECTION N/A 01/23/2018   Procedure: CESAREAN SECTION;  Surgeon: Conan Bowens, MD;  Location: [facility] SU[facility];  Service: Obstetrics;  Laterality: N/A;   left ovary and tube removed  08/16/2009   10lb tumor "wrapped around ovary"     Family History  Problem Relation Age of Onset   Prostate cancer Paternal Grandfather        metastatic   Pancreatic cancer Maternal Grandmother 51   Prostate cancer Maternal Grandfather        dx 51s   Cancer Father    Hypertension Father    Hypertension Mother    Diabetes Mother        pre-diabetes   BRCA 1/2 Sister    Premature birth Daughter        born at 58 weeks   Breast cancer Maternal Aunt 47   Breast cancer Maternal Aunt 68   Diabetes Paternal Aunt    Prostate cancer Paternal Uncle        all 6 uncles with prostate cancer    Social History   Socioeconomic History   Marital status: Married    Spouse name: Not on file   Number of children: 5   Years of education: Not on file   Highest education level: Not on file  Occupational History   Not on file  Tobacco Use   Smoking status: Never   Smokeless tobacco: Never  Vaping Use   Vaping status: Never Used  Substance and Sexual Activity   Alcohol use: No   Drug use: No   Sexual activity: Yes    Birth  control/protection: None  Other Topics Concern   Not on file  Social History Narrative   Not on file   Social Drivers of Health   Financial Resource Strain: Medium Risk (09/22/2021)   Overall Financial Resource Strain (CARDIA)    Difficulty of Paying Living Expenses: Somewhat hard  Food Insecurity: Food Insecurity Present (09/22/2021)   Hunger Vital Sign    Worried About Running Out of Food in the Last Year: Sometimes true    Ran Out of Food in the Last Year: Sometimes true  Transportation Needs: No Transportation Needs (09/22/2021)   PRAPARE - Administrator, Civil Service (Medical): No    Lack of Transportation (Non-Medical):  No  Physical Activity: Insufficiently Active (09/22/2021)   Exercise Vital Sign    Days of Exercise per Week: 2 days    Minutes of Exercise per Session: 30 min  Stress: Stress Concern Present (09/22/2021)   [employer] of Occupational Health - Occupational Stress Questionnaire    Feeling of Stress : Very much  Social Connections: Socially Integrated (09/22/2021)   Social Connection and Isolation Panel [NHANES]    Frequency of Communication with Friends and Family: More than three times a week    Frequency of Social Gatherings with Friends and Family: Never    Attends Religious Services: More than 4 times per year    Active Member of [employer] or Organizations: Yes    Attends Banker Meetings: Never    Marital Status: Married  Catering manager Violence: Not At Risk (09/22/2021)   Humiliation, Afraid, Rape, and Kick questionnaire    Fear of Current or Ex-Partner: No    Emotionally Abused: No    Physically Abused: No    Sexually Abused: No    Current Outpatient Medications on File Prior to Visit  Medication Sig Dispense Refill   amLODipine (NORVASC) 10 MG tablet Take 1 tablet (10 mg total) by mouth daily. 90 tablet 3   citalopram (CELEXA) 20 MG tablet Take 1 tablet (20 mg total) by mouth daily. 90 tablet 3   Cyanocobalamin (VITAMIN B-12 PO) Take by mouth. (Patient not taking: Reported on 08/15/2023)     ibuprofen (ADVIL) 800 MG tablet Take 1 tablet (800 mg total) by mouth every 8 (eight) hours as needed. (Patient not taking: Reported on 08/15/2023) 21 tablet 0   methocarbamol (ROBAXIN) 500 MG tablet Take 1 tablet (500 mg total) by mouth 2 (two) times daily as needed for muscle spasms. (Patient not taking: Reported on 08/15/2023) 20 tablet 0   Multiple Vitamin (MULTIVITAMIN) tablet Take 1 tablet by mouth daily. (Patient not taking: Reported on 08/15/2023)     No current facility-administered medications on file prior to visit.    No Known Allergies      Observations/Objective: There were no vitals filed for this visit. There is no height or weight on file to calculate BMI.  Physical Exam Neurological:     Mental Status: She is alert.     Labs    Latest Ref Rng & Units 08/11/2023    2:01 PM 04/11/2023    9:26 AM 11/01/2022    1:22 PM  CBC  WBC 4.0 - 10.5 K/uL 6.9  8.1  7.7   Hemoglobin 12.0 - 15.0 g/dL 9.9  95.6  21.3   Hematocrit 36.0 - 46.0 % 31.2  33.2  37.2   Platelets 150 - 400 K/uL 437  419  402    Lab Results  Component Value Date   HGB  9.9 (L) 08/11/2023   TIBC 427 08/11/2023   IRONPCTSAT 4 (L) 08/11/2023   FERRITIN 4 (L) 08/11/2023      ASSESSMENT & PLAN:   IDA (iron deficiency anemia) Labs reviewed and discussed with patient. Hemoglobin and iron panel have improved.   Lab Results  Component Value Date   HGB 9.9 (L) 08/11/2023   TIBC 427 08/11/2023   IRONPCTSAT 4 (L) 08/11/2023   FERRITIN 4 (L) 08/11/2023      Recommend patient to get IV Venofer weekly x 4 Patient declines pregnancy testing. IDA ls likely due to heavy menstrual bleeding.  She has also complained constipation and is interested in seeing GI, will refer.    Orders Placed This Encounter  Procedures   CBC with Differential (Cancer Center Only)    Standing Status:   Future    Expected Date:   10/13/2023    Expiration Date:   08/14/2024   Iron and TIBC    Standing Status:   Future    Expected Date:   10/13/2023    Expiration Date:   08/14/2024   Ferritin    Standing Status:   Future    Expected Date:   10/13/2023    Expiration Date:   08/14/2024   Ambulatory referral to Gastroenterology    Referral Priority:   Routine    Referral Type:   Consultation    Referral Reason:   Specialty Services Required    Number of Visits Requested:   1   Follow up in 3 months She prefers virtual visit.  I discussed the assessment and treatment plan with the patient. The patient was provided an opportunity to ask questions and all were answered. The patient  agreed with the plan and demonstrated an understanding of the instructions.  The patient was advised to call back or seek an in-person evaluation if the symptoms worsen or if the condition fails to improve as anticipated.   Follow-up in 4 months. Rickard Patience, MD 08/15/2023 6:36 PM

## 2023-08-15 NOTE — Assessment & Plan Note (Addendum)
Labs reviewed and discussed with patient. Hemoglobin and iron panel have improved.   Lab Results  Component Value Date   HGB 9.9 (L) 08/11/2023   TIBC 427 08/11/2023   IRONPCTSAT 4 (L) 08/11/2023   FERRITIN 4 (L) 08/11/2023      Recommend patient to get IV Venofer weekly x 4 Patient declines pregnancy testing. IDA ls likely due to heavy menstrual bleeding.  She has also complained constipation and is interested in seeing GI, will refer.

## 2023-08-19 ENCOUNTER — Inpatient Hospital Stay: Payer: BC Managed Care – PPO

## 2023-08-19 VITALS — BP 144/83 | HR 85 | Temp 97.3°F | Resp 18

## 2023-08-19 DIAGNOSIS — D509 Iron deficiency anemia, unspecified: Secondary | ICD-10-CM | POA: Diagnosis not present

## 2023-08-19 DIAGNOSIS — D500 Iron deficiency anemia secondary to blood loss (chronic): Secondary | ICD-10-CM

## 2023-08-19 MED ORDER — IRON SUCROSE 20 MG/ML IV SOLN
200.0000 mg | Freq: Once | INTRAVENOUS | Status: AC
  Administered 2023-08-19: 200 mg via INTRAVENOUS

## 2023-08-19 MED ORDER — SODIUM CHLORIDE 0.9% FLUSH
10.0000 mL | Freq: Once | INTRAVENOUS | Status: AC | PRN
  Administered 2023-08-19: 10 mL
  Filled 2023-08-19: qty 10

## 2023-08-26 ENCOUNTER — Inpatient Hospital Stay: Payer: BC Managed Care – PPO

## 2023-08-26 VITALS — BP 143/81 | HR 90 | Temp 97.8°F | Resp 18

## 2023-08-26 DIAGNOSIS — D500 Iron deficiency anemia secondary to blood loss (chronic): Secondary | ICD-10-CM

## 2023-08-26 DIAGNOSIS — D509 Iron deficiency anemia, unspecified: Secondary | ICD-10-CM | POA: Diagnosis not present

## 2023-08-26 MED ORDER — SODIUM CHLORIDE 0.9% FLUSH
10.0000 mL | Freq: Once | INTRAVENOUS | Status: AC | PRN
  Administered 2023-08-26: 10 mL
  Filled 2023-08-26: qty 10

## 2023-08-26 MED ORDER — IRON SUCROSE 20 MG/ML IV SOLN
200.0000 mg | Freq: Once | INTRAVENOUS | Status: AC
Start: 2023-08-26 — End: 2023-08-26
  Administered 2023-08-26: 200 mg via INTRAVENOUS

## 2023-09-02 ENCOUNTER — Inpatient Hospital Stay: Payer: BC Managed Care – PPO

## 2023-09-02 VITALS — BP 123/93 | HR 81 | Temp 98.0°F | Resp 18

## 2023-09-02 DIAGNOSIS — D500 Iron deficiency anemia secondary to blood loss (chronic): Secondary | ICD-10-CM

## 2023-09-02 DIAGNOSIS — D509 Iron deficiency anemia, unspecified: Secondary | ICD-10-CM | POA: Diagnosis not present

## 2023-09-02 MED ORDER — IRON SUCROSE 20 MG/ML IV SOLN
200.0000 mg | Freq: Once | INTRAVENOUS | Status: AC
  Administered 2023-09-02: 200 mg via INTRAVENOUS

## 2023-09-02 MED ORDER — SODIUM CHLORIDE 0.9% FLUSH
10.0000 mL | Freq: Once | INTRAVENOUS | Status: AC | PRN
Start: 2023-09-02 — End: 2023-09-02
  Administered 2023-09-02: 10 mL
  Filled 2023-09-02: qty 10

## 2023-09-09 ENCOUNTER — Inpatient Hospital Stay: Payer: BC Managed Care – PPO

## 2023-09-09 VITALS — BP 132/80 | HR 90 | Temp 97.3°F | Resp 18

## 2023-09-09 DIAGNOSIS — D509 Iron deficiency anemia, unspecified: Secondary | ICD-10-CM | POA: Diagnosis not present

## 2023-09-09 DIAGNOSIS — D500 Iron deficiency anemia secondary to blood loss (chronic): Secondary | ICD-10-CM

## 2023-09-09 MED ORDER — SODIUM CHLORIDE 0.9% FLUSH
10.0000 mL | Freq: Once | INTRAVENOUS | Status: AC | PRN
  Administered 2023-09-09: 10 mL
  Filled 2023-09-09: qty 10

## 2023-09-09 MED ORDER — IRON SUCROSE 20 MG/ML IV SOLN
200.0000 mg | Freq: Once | INTRAVENOUS | Status: AC
  Administered 2023-09-09: 200 mg via INTRAVENOUS

## 2023-10-18 ENCOUNTER — Encounter: Payer: Self-pay | Admitting: Oncology

## 2023-10-19 ENCOUNTER — Other Ambulatory Visit: Payer: Self-pay

## 2023-10-19 DIAGNOSIS — D500 Iron deficiency anemia secondary to blood loss (chronic): Secondary | ICD-10-CM

## 2023-10-26 ENCOUNTER — Inpatient Hospital Stay

## 2023-10-27 ENCOUNTER — Inpatient Hospital Stay: Attending: Oncology

## 2023-10-27 DIAGNOSIS — Z79899 Other long term (current) drug therapy: Secondary | ICD-10-CM | POA: Insufficient documentation

## 2023-10-27 DIAGNOSIS — Z8249 Family history of ischemic heart disease and other diseases of the circulatory system: Secondary | ICD-10-CM | POA: Diagnosis not present

## 2023-10-27 DIAGNOSIS — Z90721 Acquired absence of ovaries, unilateral: Secondary | ICD-10-CM | POA: Insufficient documentation

## 2023-10-27 DIAGNOSIS — D509 Iron deficiency anemia, unspecified: Secondary | ICD-10-CM | POA: Insufficient documentation

## 2023-10-27 DIAGNOSIS — N920 Excessive and frequent menstruation with regular cycle: Secondary | ICD-10-CM | POA: Diagnosis not present

## 2023-10-27 DIAGNOSIS — Z8489 Family history of other specified conditions: Secondary | ICD-10-CM | POA: Insufficient documentation

## 2023-10-27 DIAGNOSIS — Z8042 Family history of malignant neoplasm of prostate: Secondary | ICD-10-CM | POA: Insufficient documentation

## 2023-10-27 DIAGNOSIS — D75839 Thrombocytosis, unspecified: Secondary | ICD-10-CM | POA: Diagnosis not present

## 2023-10-27 DIAGNOSIS — Z803 Family history of malignant neoplasm of breast: Secondary | ICD-10-CM | POA: Insufficient documentation

## 2023-10-27 DIAGNOSIS — R5383 Other fatigue: Secondary | ICD-10-CM | POA: Insufficient documentation

## 2023-10-27 DIAGNOSIS — Z809 Family history of malignant neoplasm, unspecified: Secondary | ICD-10-CM | POA: Diagnosis not present

## 2023-10-27 DIAGNOSIS — D500 Iron deficiency anemia secondary to blood loss (chronic): Secondary | ICD-10-CM

## 2023-10-27 DIAGNOSIS — Z5986 Financial insecurity: Secondary | ICD-10-CM | POA: Diagnosis not present

## 2023-10-27 DIAGNOSIS — Z833 Family history of diabetes mellitus: Secondary | ICD-10-CM | POA: Diagnosis not present

## 2023-10-27 DIAGNOSIS — Z832 Family history of diseases of the blood and blood-forming organs and certain disorders involving the immune mechanism: Secondary | ICD-10-CM | POA: Insufficient documentation

## 2023-10-27 LAB — CBC WITH DIFFERENTIAL (CANCER CENTER ONLY)
Abs Immature Granulocytes: 0.03 10*3/uL (ref 0.00–0.07)
Basophils Absolute: 0 10*3/uL (ref 0.0–0.1)
Basophils Relative: 1 %
Eosinophils Absolute: 0.2 10*3/uL (ref 0.0–0.5)
Eosinophils Relative: 3 %
HCT: 35.1 % — ABNORMAL LOW (ref 36.0–46.0)
Hemoglobin: 11.1 g/dL — ABNORMAL LOW (ref 12.0–15.0)
Immature Granulocytes: 0 %
Lymphocytes Relative: 23 %
Lymphs Abs: 1.6 10*3/uL (ref 0.7–4.0)
MCH: 23.6 pg — ABNORMAL LOW (ref 26.0–34.0)
MCHC: 31.6 g/dL (ref 30.0–36.0)
MCV: 74.7 fL — ABNORMAL LOW (ref 80.0–100.0)
Monocytes Absolute: 0.7 10*3/uL (ref 0.1–1.0)
Monocytes Relative: 11 %
Neutro Abs: 4.2 10*3/uL (ref 1.7–7.7)
Neutrophils Relative %: 62 %
Platelet Count: 396 10*3/uL (ref 150–400)
RBC: 4.7 MIL/uL (ref 3.87–5.11)
RDW: 20.6 % — ABNORMAL HIGH (ref 11.5–15.5)
WBC Count: 6.8 10*3/uL (ref 4.0–10.5)
nRBC: 0 % (ref 0.0–0.2)

## 2023-10-27 LAB — SAMPLE TO BLOOD BANK

## 2023-10-27 LAB — IRON AND TIBC
Iron: 33 ug/dL (ref 28–170)
Saturation Ratios: 8 % — ABNORMAL LOW (ref 10.4–31.8)
TIBC: 402 ug/dL (ref 250–450)
UIBC: 369 ug/dL

## 2023-10-27 LAB — FERRITIN: Ferritin: 13 ng/mL (ref 11–307)

## 2023-10-28 ENCOUNTER — Inpatient Hospital Stay

## 2023-10-28 ENCOUNTER — Inpatient Hospital Stay: Admitting: Oncology

## 2023-10-28 ENCOUNTER — Encounter: Payer: Self-pay | Admitting: Oncology

## 2023-10-28 VITALS — BP 122/87 | HR 80

## 2023-10-28 VITALS — BP 137/102 | HR 88 | Temp 96.0°F | Resp 18 | Wt 272.2 lb

## 2023-10-28 DIAGNOSIS — Z809 Family history of malignant neoplasm, unspecified: Secondary | ICD-10-CM

## 2023-10-28 DIAGNOSIS — D509 Iron deficiency anemia, unspecified: Secondary | ICD-10-CM | POA: Diagnosis not present

## 2023-10-28 DIAGNOSIS — D500 Iron deficiency anemia secondary to blood loss (chronic): Secondary | ICD-10-CM

## 2023-10-28 MED ORDER — IRON SUCROSE 20 MG/ML IV SOLN
200.0000 mg | Freq: Once | INTRAVENOUS | Status: AC
  Administered 2023-10-28: 200 mg via INTRAVENOUS
  Filled 2023-10-28: qty 10

## 2023-10-28 NOTE — Progress Notes (Signed)
 Hematology/Oncology Progress note [phone] [phone]            Patient Care Team: [facility] as PCP - General Kim Forbes, MD as Consulting Physician (Oncology)  CHIEF COMPLAINTS/REASON FOR VISIT:  Follow up for anemia    ASSESSMENT & PLAN:   IDA (iron  deficiency anemia) Labs reviewed and discussed with patient. Hemoglobin and iron  panel have improved.   Lab Results  Component Value Date   HGB 11.1 (L) 10/27/2023   TIBC 402 10/27/2023   IRONPCTSAT 8 (L) 10/27/2023   FERRITIN 13 10/27/2023      Recommend patient to get IV Venofer  weekly x 3 Patient declines pregnancy testing. IDA ls likely due to heavy menstrual bleeding.    Family history of cancer Negative genetic testing   Orders Placed This Encounter  Procedures   CBC with Differential (Cancer Center Only)    Standing Status:   Future    Expected Date:   01/27/2024    Expiration Date:   10/27/2024   Iron  and TIBC    Standing Status:   Future    Expected Date:   01/27/2024    Expiration Date:   10/27/2024   Ferritin    Standing Status:   Future    Expected Date:   01/27/2024    Expiration Date:   10/27/2024   Follow-up in 4 months. All questions were answered. The patient knows to call the clinic with any problems, questions or concerns.  Kim Forbes, MD, PhD [facility] Hematology Oncology 10/28/2023   HISTORY OF PRESENTING ILLNESS:   Kim Duran is a  38 y.o.  female presents for follow up of iron  deficiency anemia and thrombocytosis 09/22/2021, patient had a CBC done which showed a hemoglobin of 9.2, MCV 66, platelets 511.  I reviewed patient's previous lab records. Patient has chronic anemia, MCV is chronically decreased. Patient reports heavy menstrual period.  She is not interested in any further intervention.  Denies blood in the stool. Patient is married for 5 children.  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   INTERVAL HISTORY Kim Duran is a 38 y.o. female who has above history reviewed by me today presents for follow up visit for iron  deficincy anemia and thrombocytosis Patient tolerates IV Venofer  treatments, although she does not feel significant improvement after iron  infusions.  Heavy menstrual bleeding.   Review of Systems  Constitutional:  Positive for fatigue. Negative for appetite change, chills and fever.  HENT:   Negative for hearing loss and voice change.   Eyes:  Negative for eye problems.  Respiratory:  Negative for chest tightness and cough.   Cardiovascular:  Negative for chest pain.  Gastrointestinal:  Negative for abdominal distention, abdominal pain and blood in stool.  Endocrine: Negative for hot flashes.  Genitourinary:  Negative for difficulty urinating and frequency.   Musculoskeletal:  Negative for arthralgias.  Skin:  Negative for itching and rash.  Neurological:  Negative for extremity weakness.  Hematological:  Negative for adenopathy.  Psychiatric/Behavioral:  Negative for confusion.     MEDICAL HISTORY:  Past Medical History:  Diagnosis Date   Anemia    Hypertension    Medical history non-contributory     SURGICAL HISTORY: Past Surgical History:  Procedure Laterality Date   CESAREAN SECTION     CESAREAN SECTION N/A 01/23/2018   Procedure: CESAREAN SECTION;  Surgeon: Jan Mcgill, MD;  Location: [facility] BIRTHING SUITES;  Service: Obstetrics;  Laterality: N/A;   left ovary  and tube removed  08/16/2009   10lb tumor "wrapped around ovary"     SOCIAL HISTORY: Social History   Socioeconomic History   Marital status: Married    Spouse name: Not on file   Number of children: 5   Years of education: Not on file   Highest education level: Not on file  Occupational History   Not on file  Tobacco Use   Smoking status: Never   Smokeless tobacco: Never  Vaping Use   Vaping status: Never Used  Substance and Sexual Activity   Alcohol use: No   Drug use: No   Sexual  activity: Yes    Birth control/protection: None  Other Topics Concern   Not on file  Social History Narrative   Not on file   Social Drivers of Health   Financial Resource Strain: Medium Risk (09/22/2021)   Overall Financial Resource Strain (CARDIA)    Difficulty of Paying Living Expenses: Somewhat hard  Food Insecurity: Food Insecurity Present (09/22/2021)   Hunger Vital Sign    Worried About Running Out of Food in the Last Year: Sometimes true    Ran Out of Food in the Last Year: Sometimes true  Transportation Needs: No Transportation Needs (09/22/2021)   PRAPARE - Administrator, Civil Service (Medical): No    Lack of Transportation (Non-Medical): No  Physical Activity: Insufficiently Active (09/22/2021)   Exercise Vital Sign    Days of Exercise per Week: 2 days    Minutes of Exercise per Session: 30 min  Stress: Stress Concern Present (09/22/2021)   [employer] of Occupational Health - Occupational Stress Questionnaire    Feeling of Stress : Very much  Social Connections: Socially Integrated (09/22/2021)   Social Connection and Isolation Panel [NHANES]    Frequency of Communication with Friends and Family: More than three times a week    Frequency of Social Gatherings with Friends and Family: Never    Attends Religious Services: More than 4 times per year    Active Member of [employer] or Organizations: Yes    Attends Banker Meetings: Never    Marital Status: Married  Catering manager Violence: Not At Risk (09/22/2021)   Humiliation, Afraid, Rape, and Kick questionnaire    Fear of Current or Ex-Partner: No    Emotionally Abused: No    Physically Abused: No    Sexually Abused: No    FAMILY HISTORY: Family History  Problem Relation Age of Onset   Prostate cancer Paternal Grandfather        metastatic   Pancreatic cancer Maternal Grandmother 40   Prostate cancer Maternal Grandfather        dx 36s   Cancer Father    Hypertension Father     Hypertension Mother    Diabetes Mother        pre-diabetes   BRCA 1/2 Sister    Premature birth Daughter        born at 52 weeks   Breast cancer Maternal Aunt 47   Breast cancer Maternal Aunt 50   Diabetes Paternal Aunt    Prostate cancer Paternal Uncle        all 6 uncles with prostate cancer    ALLERGIES:  has no known allergies.  MEDICATIONS:  Current Outpatient Medications  Medication Sig Dispense Refill   amLODipine  (NORVASC ) 10 MG tablet Take 1 tablet (10 mg total) by mouth daily. 90 tablet 3   citalopram  (CELEXA ) 20 MG tablet Take 1 tablet (20  mg total) by mouth daily. 90 tablet 3   Cyanocobalamin  (VITAMIN B-12 PO) Take by mouth. (Patient not taking: Reported on 08/15/2023)     ibuprofen  (ADVIL ) 800 MG tablet Take 1 tablet (800 mg total) by mouth every 8 (eight) hours as needed. (Patient not taking: Reported on 08/15/2023) 21 tablet 0   methocarbamol  (ROBAXIN ) 500 MG tablet Take 1 tablet (500 mg total) by mouth 2 (two) times daily as needed for muscle spasms. (Patient not taking: Reported on 08/15/2023) 20 tablet 0   Multiple Vitamin (MULTIVITAMIN) tablet Take 1 tablet by mouth daily. (Patient not taking: Reported on 08/15/2023)     No current facility-administered medications for this visit.     PHYSICAL EXAMINATION:  Vitals:   10/28/23 1227 10/28/23 1232  BP: (!) 146/102 (!) 137/102  Pulse: 88   Resp: 18   Temp: (!) 96 F (35.6 C)   SpO2: 100%    Filed Weights   10/28/23 1227  Weight: 272 lb 3.2 oz (123.5 kg)    Physical Exam Constitutional:      General: She is not in acute distress.    Appearance: She is obese.  HENT:     Head: Normocephalic and atraumatic.  Eyes:     General: No scleral icterus. Cardiovascular:     Rate and Rhythm: Normal rate and regular rhythm.  Pulmonary:     Effort: Pulmonary effort is normal. No respiratory distress.     Breath sounds: No wheezing.  Abdominal:     General: Bowel sounds are normal. There is no distension.      Palpations: Abdomen is soft.  Musculoskeletal:        General: Normal range of motion.     Cervical back: Normal range of motion and neck supple.  Skin:    General: Skin is warm and dry.     Findings: No erythema or rash.  Neurological:     Mental Status: She is alert and oriented to person, place, and time. Mental status is at baseline.  Psychiatric:        Mood and Affect: Mood normal.     LABORATORY DATA:  I have reviewed the data as listed    Latest Ref Rng & Units 10/27/2023    3:48 PM 08/11/2023    2:01 PM 04/11/2023    9:26 AM  CBC  WBC 4.0 - 10.5 K/uL 6.8  6.9  8.1   Hemoglobin 12.0 - 15.0 g/dL 29.5  9.9  62.1   Hematocrit 36.0 - 46.0 % 35.1  31.2  33.2   Platelets 150 - 400 K/uL 396  437  419       Latest Ref Rng & Units 10/29/2021    4:12 PM 09/22/2021    4:14 PM 03/03/2020   10:15 AM  CMP  Glucose 70 - 99 mg/dL 308  85  657   BUN 6 - 20 mg/dL 11  12  11    Creatinine 0.44 - 1.00 mg/dL 8.46  9.62  9.52   Sodium 135 - 145 mmol/L 136  138  137   Potassium 3.5 - 5.1 mmol/L 4.0  4.2  4.5   Chloride 98 - 111 mmol/L 105  102  104   CO2 22 - 32 mmol/L 26  23  19    Calcium  8.9 - 10.3 mg/dL 9.1  9.6  9.8   Total Protein 6.5 - 8.1 g/dL 8.4  8.1  8.0   Total Bilirubin 0.3 - 1.2 mg/dL 0.3  <8.4  0.3   Alkaline Phos 38 - 126 U/L 63  84  69   AST 15 - 41 U/L 18  15  13    ALT 0 - 44 U/L 16  11  11      Iron /TIBC/Ferritin/ %Sat    Component Value Date/Time   IRON  33 10/27/2023 1548   TIBC 402 10/27/2023 1548   FERRITIN 13 10/27/2023 1548   IRONPCTSAT 8 (L) 10/27/2023 1548      RADIOGRAPHIC STUDIES: I have personally reviewed the radiological images as listed and agreed with the findings in the report. No results found.

## 2023-10-28 NOTE — Assessment & Plan Note (Addendum)
 Labs reviewed and discussed with patient. Hemoglobin and iron  panel have improved.   Lab Results  Component Value Date   HGB 11.1 (L) 10/27/2023   TIBC 402 10/27/2023   IRONPCTSAT 8 (L) 10/27/2023   FERRITIN 13 10/27/2023      Recommend patient to get IV Venofer  weekly x 3 Patient declines pregnancy testing. IDA ls likely due to heavy menstrual bleeding.

## 2023-10-28 NOTE — Assessment & Plan Note (Signed)
Negative genetic testing

## 2023-11-04 ENCOUNTER — Inpatient Hospital Stay

## 2023-11-04 VITALS — BP 142/100 | HR 74 | Temp 96.0°F | Resp 16

## 2023-11-04 DIAGNOSIS — D509 Iron deficiency anemia, unspecified: Secondary | ICD-10-CM | POA: Diagnosis not present

## 2023-11-04 DIAGNOSIS — D500 Iron deficiency anemia secondary to blood loss (chronic): Secondary | ICD-10-CM

## 2023-11-04 MED ORDER — IRON SUCROSE 20 MG/ML IV SOLN
200.0000 mg | Freq: Once | INTRAVENOUS | Status: AC
  Administered 2023-11-04: 200 mg via INTRAVENOUS
  Filled 2023-11-04: qty 10

## 2023-11-04 NOTE — Patient Instructions (Signed)

## 2023-11-04 NOTE — Progress Notes (Signed)
 Declined post observation. Aware of risks. Vitals stable at d/c.

## 2023-11-11 ENCOUNTER — Inpatient Hospital Stay

## 2023-11-18 ENCOUNTER — Inpatient Hospital Stay

## 2023-12-07 ENCOUNTER — Inpatient Hospital Stay

## 2023-12-07 ENCOUNTER — Inpatient Hospital Stay: Attending: Oncology

## 2023-12-07 VITALS — BP 137/88 | HR 71 | Temp 97.6°F | Resp 16

## 2023-12-07 DIAGNOSIS — Z79899 Other long term (current) drug therapy: Secondary | ICD-10-CM | POA: Insufficient documentation

## 2023-12-07 DIAGNOSIS — D500 Iron deficiency anemia secondary to blood loss (chronic): Secondary | ICD-10-CM

## 2023-12-07 DIAGNOSIS — D509 Iron deficiency anemia, unspecified: Secondary | ICD-10-CM | POA: Insufficient documentation

## 2023-12-07 MED ORDER — IRON SUCROSE 20 MG/ML IV SOLN
200.0000 mg | Freq: Once | INTRAVENOUS | Status: AC
  Administered 2023-12-07: 200 mg via INTRAVENOUS
  Filled 2023-12-07: qty 10

## 2023-12-07 NOTE — Patient Instructions (Signed)

## 2023-12-13 ENCOUNTER — Other Ambulatory Visit: Payer: BC Managed Care – PPO

## 2023-12-20 ENCOUNTER — Telehealth: Payer: BC Managed Care – PPO | Admitting: Oncology

## 2024-01-04 ENCOUNTER — Encounter (HOSPITAL_COMMUNITY): Payer: Self-pay

## 2024-01-04 ENCOUNTER — Emergency Department (HOSPITAL_COMMUNITY)
Admission: EM | Admit: 2024-01-04 | Discharge: 2024-01-04 | Disposition: A | Discharge status: Home / Self Care | Attending: Emergency Medicine | Admitting: Emergency Medicine

## 2024-01-04 ENCOUNTER — Other Ambulatory Visit: Payer: Self-pay

## 2024-01-04 DIAGNOSIS — I10 Essential (primary) hypertension: Secondary | ICD-10-CM | POA: Insufficient documentation

## 2024-01-04 DIAGNOSIS — G43809 Other migraine, not intractable, without status migrainosus: Secondary | ICD-10-CM | POA: Diagnosis not present

## 2024-01-04 DIAGNOSIS — Z79899 Other long term (current) drug therapy: Secondary | ICD-10-CM | POA: Diagnosis not present

## 2024-01-04 DIAGNOSIS — R519 Headache, unspecified: Secondary | ICD-10-CM | POA: Diagnosis present

## 2024-01-04 LAB — PREGNANCY, URINE: Preg Test, Ur: NEGATIVE

## 2024-01-04 MED ORDER — METOCLOPRAMIDE HCL 10 MG PO TABS: 10.0000 mg | ORAL_TABLET | Freq: Four times a day (QID) | ORAL | 0 refills | Status: AC

## 2024-01-04 MED ORDER — AMLODIPINE BESYLATE 10 MG PO TABS: 10.0000 mg | ORAL_TABLET | Freq: Every day | ORAL | 3 refills | Status: AC

## 2024-01-04 MED ORDER — METOCLOPRAMIDE HCL 10 MG PO TABS
10.0000 mg | ORAL_TABLET | Freq: Once | ORAL | Status: AC
  Administered 2024-01-04: 10 mg via ORAL
  Filled 2024-01-04: qty 1

## 2024-01-04 MED ORDER — DEXAMETHASONE 4 MG PO TABS
10.0000 mg | ORAL_TABLET | ORAL | Status: AC
  Administered 2024-01-04: 10 mg via ORAL
  Filled 2024-01-04: qty 3

## 2024-01-04 MED ORDER — DIPHENHYDRAMINE HCL 25 MG PO CAPS
25.0000 mg | ORAL_CAPSULE | Freq: Once | ORAL | Status: AC
  Administered 2024-01-04: 25 mg via ORAL
  Filled 2024-01-04: qty 1

## 2024-01-04 NOTE — Discharge Instructions (Signed)
 You were seen for your headache in the emergency department.  You were given medicines which improved your symptoms.  At home, please take Tylenol and ibuprofen for your headache.  You may also take the Reglan we have prescribed you for your headache or any nausea or vomiting that you have.  You may take this with Benadryl for severe headaches but please note that this will make you drowsy.    Check your MyChart online for the results of any tests that had not resulted by the time you left the emergency department.   Follow-up with your primary doctor in 2-3 days regarding your visit.    Return immediately to the emergency department if you experience any of the following: Vision changes, numbness or weakness of your arms or legs, or any other concerning symptoms.    Thank you for visiting our Emergency Department. It was a pleasure taking care of you today.

## 2024-01-04 NOTE — ED Triage Notes (Signed)
 Pt c/o HA and HTN x 1 day, rates pain 5/10 and states HA is worse with movement and has some relief with tylenol  but HA came back. states she took her BP this morning and it was 151/101. States she does have a Hx of HTN and is on medication for it but just now started back up taking it again. Took BP medication last night.

## 2024-01-04 NOTE — ED Provider Notes (Signed)
 [facility] EMERGENCY DEPARTMENT AT [facility] Provider Note   CSN: [id number] Arrival date & time: 01/04/24  [id number]     Patient presents with: Headache   Kim Duran is a 38 y.o. female.  {Add pertinent medical, surgical, social history, OB history to HPI:6352} 38 year old female with a history of hypertension and headaches who presents emergency department for headache.  Patient reports that she had a gradual onset headache that brings her in today.  Started yesterday when she woke up.  Has worsened and this morning was 5/10 in severity.  Says that it is behind her eyes but moves around when she turns her head.  Is a pounding sensation.  No nausea or vomiting or photophobia or phonophobia.  Took some Goody's powder prior to arrival and says that her headache is now 3/10 in severity.  Does have a history of headaches.  Thinks this 1 was triggered by her air conditioner going out at home.       Prior to Admission medications   Medication Sig Start Date End Date Taking? Authorizing Provider  amLODipine  (NORVASC ) 10 MG tablet Take 1 tablet (10 mg total) by mouth daily. 03/31/23   Kizzie Suzen SAUNDERS, CNM  citalopram  (CELEXA ) 20 MG tablet Take 1 tablet (20 mg total) by mouth daily. 03/31/23   Kizzie Suzen SAUNDERS, CNM  Cyanocobalamin  (VITAMIN B-12 PO) Take by mouth. Patient not taking: Reported on 08/15/2023    [provider]  ibuprofen  (ADVIL ) 800 MG tablet Take 1 tablet (800 mg total) by mouth every 8 (eight) hours as needed. Patient not taking: Reported on 08/15/2023 08/31/22   Towana Ozell BROCKS, MD  methocarbamol  (ROBAXIN ) 500 MG tablet Take 1 tablet (500 mg total) by mouth 2 (two) times daily as needed for muscle spasms. Patient not taking: Reported on 08/15/2023 08/31/22   Towana Ozell BROCKS, MD  Multiple Vitamin (MULTIVITAMIN) tablet Take 1 tablet by mouth daily. Patient not taking: Reported on 08/15/2023    [provider]    Allergies: Patient has no known  allergies.    Review of Systems  Updated Vital Signs BP (!) 151/97 (BP Location: Left Arm)   Pulse 87   Temp 98.5 F (36.9 C) (Oral)   Resp 17   SpO2 99%   Physical Exam Vitals and nursing note reviewed.  Constitutional:      General: She is not in acute distress.    Appearance: She is well-developed.  HENT:     Head: Normocephalic and atraumatic.     Right Ear: External ear normal.     Left Ear: External ear normal.     Nose: Nose normal.   Eyes:     Extraocular Movements: Extraocular movements intact.     Conjunctiva/sclera: Conjunctivae normal.     Pupils: Pupils are equal, round, and reactive to light.   Neck:     Comments: No meningismus Cardiovascular:     Rate and Rhythm: Regular rhythm.   Musculoskeletal:     Cervical back: Normal range of motion and neck supple.   Skin:    General: Skin is warm and dry.   Neurological:     Mental Status: She is alert.     Comments: MENTAL STATUS: AAOx3 CRANIAL NERVES: II: Pupils equal and reactive 4 mm BL, no RAPD, no VF deficits III, IV, VI: EOM intact, no gaze preference or deviation, no nystagmus. V: normal sensation to light touch in V1, V2, and V3 segments bilaterally VII: no facial weakness or asymmetry,  no nasolabial fold flattening VIII: normal hearing to speech and finger friction IX, X: normal palatal elevation, no uvular deviation XI: 5/5 head turn and 5/5 shoulder shrug bilaterally XII: midline tongue protrusion MOTOR: 5/5 strength in R shoulder flexion, elbow flexion and extension, and grip strength. 5/5 strength in L shoulder flexion, elbow flexion and extension, and grip strength.  5/5 strength in R hip and knee flexion, knee extension, ankle plantar and dorsiflexion. 5/5 strength in L hip and knee flexion, knee extension, ankle plantar and dorsiflexion. SENSORY: Normal sensation to light touch in all extremities COORD: Normal finger to nose and heel to shin, no tremor, no dysmetria  Psychiatric:         Mood and Affect: Mood normal.     (all labs ordered are listed, but only abnormal results are displayed) Labs Reviewed - No data to display  EKG: None  Radiology: No results found.  {Document cardiac monitor, telemetry assessment procedure when appropriate:32947} Procedures   Medications Ordered in the ED - No data to display    {Click here for ABCD2, HEART and other calculators REFRESH Note before signing:1}                              Medical Decision Making Amount and/or Complexity of Data Reviewed Labs: ordered.  Risk Prescription drug management.   ***  {Document critical care time when appropriate  Document review of labs and clinical decision tools ie CHADS2VASC2, etc  Document your independent review of radiology images and any outside records  Document your discussion with family members, caretakers and with consultants  Document social determinants of health affecting pt's care  Document your decision making why or why not admission, treatments were needed:32947:::1}   Final diagnoses:  None    ED Discharge Orders     None

## 2024-02-03 ENCOUNTER — Inpatient Hospital Stay

## 2024-02-10 ENCOUNTER — Inpatient Hospital Stay

## 2024-02-10 ENCOUNTER — Encounter: Payer: Self-pay | Admitting: Oncology

## 2024-02-10 ENCOUNTER — Inpatient Hospital Stay: Attending: Oncology | Admitting: Oncology

## 2024-02-10 DIAGNOSIS — Z800 Family history of malignant neoplasm of digestive organs: Secondary | ICD-10-CM | POA: Diagnosis not present

## 2024-02-10 DIAGNOSIS — Z79899 Other long term (current) drug therapy: Secondary | ICD-10-CM | POA: Insufficient documentation

## 2024-02-10 DIAGNOSIS — D500 Iron deficiency anemia secondary to blood loss (chronic): Secondary | ICD-10-CM

## 2024-02-10 DIAGNOSIS — Z90721 Acquired absence of ovaries, unilateral: Secondary | ICD-10-CM | POA: Diagnosis not present

## 2024-02-10 DIAGNOSIS — Z5986 Financial insecurity: Secondary | ICD-10-CM | POA: Diagnosis not present

## 2024-02-10 DIAGNOSIS — Z8042 Family history of malignant neoplasm of prostate: Secondary | ICD-10-CM | POA: Diagnosis not present

## 2024-02-10 DIAGNOSIS — Z833 Family history of diabetes mellitus: Secondary | ICD-10-CM | POA: Diagnosis not present

## 2024-02-10 DIAGNOSIS — D509 Iron deficiency anemia, unspecified: Secondary | ICD-10-CM | POA: Diagnosis present

## 2024-02-10 DIAGNOSIS — Z803 Family history of malignant neoplasm of breast: Secondary | ICD-10-CM | POA: Insufficient documentation

## 2024-02-10 DIAGNOSIS — R5383 Other fatigue: Secondary | ICD-10-CM | POA: Insufficient documentation

## 2024-02-10 DIAGNOSIS — Z8249 Family history of ischemic heart disease and other diseases of the circulatory system: Secondary | ICD-10-CM | POA: Diagnosis not present

## 2024-02-10 LAB — CBC WITH DIFFERENTIAL (CANCER CENTER ONLY)
Abs Immature Granulocytes: 0.02 K/uL (ref 0.00–0.07)
Basophils Absolute: 0 K/uL (ref 0.0–0.1)
Basophils Relative: 0 %
Eosinophils Absolute: 0.2 K/uL (ref 0.0–0.5)
Eosinophils Relative: 2 %
HCT: 36.9 % (ref 36.0–46.0)
Hemoglobin: 12 g/dL (ref 12.0–15.0)
Immature Granulocytes: 0 %
Lymphocytes Relative: 19 %
Lymphs Abs: 1.3 K/uL (ref 0.7–4.0)
MCH: 24.4 pg — ABNORMAL LOW (ref 26.0–34.0)
MCHC: 32.5 g/dL (ref 30.0–36.0)
MCV: 75 fL — ABNORMAL LOW (ref 80.0–100.0)
Monocytes Absolute: 0.5 K/uL (ref 0.1–1.0)
Monocytes Relative: 8 %
Neutro Abs: 4.9 K/uL (ref 1.7–7.7)
Neutrophils Relative %: 71 %
Platelet Count: 353 K/uL (ref 150–400)
RBC: 4.92 MIL/uL (ref 3.87–5.11)
RDW: 18.1 % — ABNORMAL HIGH (ref 11.5–15.5)
WBC Count: 6.9 K/uL (ref 4.0–10.5)
nRBC: 0 % (ref 0.0–0.2)

## 2024-02-10 LAB — IRON AND TIBC
Iron: 49 ug/dL (ref 28–170)
Saturation Ratios: 12 % (ref 10.4–31.8)
TIBC: 406 ug/dL (ref 250–450)
UIBC: 357 ug/dL

## 2024-02-10 LAB — FERRITIN: Ferritin: 15 ng/mL (ref 11–307)

## 2024-02-10 NOTE — Progress Notes (Addendum)
 HEMATOLOGY-ONCOLOGY TeleHEALTH VISIT PROGRESS NOTE  I connected with Kim Duran on 02/10/24  at 12:15 PM EDT by video enabled telemedicine visit and verified that I am speaking with the correct person using two identifiers. I discussed the limitations, risks, security and privacy concerns of performing an evaluation and management service by telemedicine and the availability of in-person appointments. The patient expressed understanding and agreed to proceed.   Other persons participating in the visit and their role in the encounter:  None  Patient's location: Patient is in her vehicle.  Not driving. Provider's location: office Chief Complaint: Iron  deficiency anemia   INTERVAL HISTORY Kim Duran is a 38 y.o. female who has above history reviewed by me today presents for follow up visit for management of iron  deficiency anemia Patient reports feeling well today.  No new complaints.  Review of Systems  Constitutional:  Positive for fatigue. Negative for appetite change, chills and fever.  HENT:   Negative for hearing loss and voice change.   Eyes:  Negative for eye problems.  Respiratory:  Negative for chest tightness and cough.   Cardiovascular:  Negative for chest pain.  Gastrointestinal:  Negative for abdominal distention, abdominal pain and blood in stool.  Endocrine: Negative for hot flashes.  Genitourinary:  Negative for difficulty urinating and frequency.   Musculoskeletal:  Negative for arthralgias.  Skin:  Negative for itching and rash.  Neurological:  Negative for extremity weakness.  Hematological:  Negative for adenopathy.  Psychiatric/Behavioral:  Negative for confusion.     Past Medical History:  Diagnosis Date   Anemia    Hypertension    Medical history non-contributory    Past Surgical History:  Procedure Laterality Date   CESAREAN SECTION     CESAREAN SECTION N/A 01/23/2018   Procedure: CESAREAN SECTION;  Surgeon: Nicholaus Burnard HERO, MD;  Location:  [facility] BIRTHING SUITES;  Service: Obstetrics;  Laterality: N/A;   left ovary and tube removed  08/16/2009   10lb tumor wrapped around ovary     Family History  Problem Relation Age of Onset   Prostate cancer Paternal Grandfather        metastatic   Pancreatic cancer Maternal Grandmother 19   Prostate cancer Maternal Grandfather        dx 36s   Cancer Father    Hypertension Father    Hypertension Mother    Diabetes Mother        pre-diabetes   BRCA 1/2 Sister    Premature birth Daughter        born at 63 weeks   Breast cancer Maternal Aunt 47   Breast cancer Maternal Aunt 27   Diabetes Paternal Aunt    Prostate cancer Paternal Uncle        all 6 uncles with prostate cancer    Social History   Socioeconomic History   Marital status: Married    Spouse name: Not on file   Number of children: 5   Years of education: Not on file   Highest education level: Not on file  Occupational History   Not on file  Tobacco Use   Smoking status: Never   Smokeless tobacco: Never  Vaping Use   Vaping status: Never Used  Substance and Sexual Activity   Alcohol use: No   Drug use: No   Sexual activity: Yes    Birth control/protection: None  Other Topics Concern   Not on file  Social History Narrative   Not on file   Social Drivers of  Health   Financial Resource Strain: Medium Risk (09/22/2021)   Overall Financial Resource Strain (CARDIA)    Difficulty of Paying Living Expenses: Somewhat hard  Food Insecurity: Food Insecurity Present (09/22/2021)   Hunger Vital Sign    Worried About Running Out of Food in the Last Year: Sometimes true    Ran Out of Food in the Last Year: Sometimes true  Transportation Needs: No Transportation Needs (09/22/2021)   PRAPARE - Administrator, Civil Service (Medical): No    Lack of Transportation (Non-Medical): No  Physical Activity: Insufficiently Active (09/22/2021)   Exercise Vital Sign    Days of Exercise per Week: 2 days    Minutes of  Exercise per Session: 30 min  Stress: Stress Concern Present (09/22/2021)   [employer] of Occupational Health - Occupational Stress Questionnaire    Feeling of Stress : Very much  Social Connections: Socially Integrated (09/22/2021)   Social Connection and Isolation Panel    Frequency of Communication with Friends and Family: More than three times a week    Frequency of Social Gatherings with Friends and Family: Never    Attends Religious Services: More than 4 times per year    Active Member of [employer] or Organizations: Yes    Attends Banker Meetings: Never    Marital Status: Married  Catering manager Violence: Not At Risk (09/22/2021)   Humiliation, Afraid, Rape, and Kick questionnaire    Fear of Current or Ex-Partner: No    Emotionally Abused: No    Physically Abused: No    Sexually Abused: No    Current Outpatient Medications on File Prior to Visit  Medication Sig Dispense Refill   amLODipine  (NORVASC ) 10 MG tablet Take 1 tablet (10 mg total) by mouth daily. 90 tablet 3   citalopram  (CELEXA ) 20 MG tablet Take 1 tablet (20 mg total) by mouth daily. 90 tablet 3   Cyanocobalamin  (VITAMIN B-12 PO) Take by mouth.     Multiple Vitamin (MULTIVITAMIN) tablet Take 1 tablet by mouth daily.     ibuprofen  (ADVIL ) 800 MG tablet Take 1 tablet (800 mg total) by mouth every 8 (eight) hours as needed. (Patient not taking: Reported on 02/10/2024) 21 tablet 0   methocarbamol  (ROBAXIN ) 500 MG tablet Take 1 tablet (500 mg total) by mouth 2 (two) times daily as needed for muscle spasms. (Patient not taking: Reported on 02/10/2024) 20 tablet 0   metoCLOPramide  (REGLAN ) 10 MG tablet Take 1 tablet (10 mg total) by mouth every 6 (six) hours. (Patient not taking: Reported on 02/10/2024) 15 tablet 0   No current facility-administered medications on file prior to visit.    No Known Allergies     Observations/Objective: There were no vitals filed for this visit. There is no height or weight on  file to calculate BMI.  Physical Exam Neurological:     Mental Status: She is alert.     Labs    Latest Ref Rng & Units 02/10/2024    9:18 AM 10/27/2023    3:48 PM 08/11/2023    2:01 PM  CBC  WBC 4.0 - 10.5 K/uL 6.9  6.8  6.9   Hemoglobin 12.0 - 15.0 g/dL 87.9  88.8  9.9   Hematocrit 36.0 - 46.0 % 36.9  35.1  31.2   Platelets 150 - 400 K/uL 353  396  437    Lab Results  Component Value Date   HGB 12.0 02/10/2024   TIBC 406 02/10/2024   IRONPCTSAT  12 02/10/2024   FERRITIN 15 02/10/2024      ASSESSMENT & PLAN:   IDA (iron  deficiency anemia) Labs reviewed and discussed with patient. Hemoglobin and iron  panel have improved.   Lab Results  Component Value Date   HGB 12.0 02/10/2024   TIBC 406 02/10/2024   IRONPCTSAT 12 02/10/2024   FERRITIN 15 02/10/2024      Both hemoglobin and iron  panel have improved.  Follow-up addition of IV Venofer  at this point.   Orders Placed This Encounter  Procedures   CBC with Differential (Cancer Center Only)    Standing Status:   Future    Expected Date:   06/11/2024    Expiration Date:   09/09/2024   Iron  and TIBC    Standing Status:   Future    Expected Date:   06/11/2024    Expiration Date:   09/09/2024   Ferritin    Standing Status:   Future    Expected Date:   06/11/2024    Expiration Date:   09/09/2024   I provided 15 minutes of visit time during this encounter which included chart review, counseling, and coordination of care as documented above.  I discussed the assessment and treatment plan with the patient. The patient was provided an opportunity to ask questions and all were answered. The patient agreed with the plan and demonstrated an understanding of the instructions.  The patient was advised to call back or seek an in-person evaluation if the symptoms worsen or if the condition fails to improve as anticipated.   Follow-up in 4 months. Zelphia Cap, MD 02/10/2024 9:10 PM

## 2024-02-10 NOTE — Assessment & Plan Note (Addendum)
 Labs reviewed and discussed with patient. Hemoglobin and iron  panel have improved.   Lab Results  Component Value Date   HGB 12.0 02/10/2024   TIBC 406 02/10/2024   IRONPCTSAT 12 02/10/2024   FERRITIN 15 02/10/2024      Both hemoglobin and iron  panel have improved.  Follow-up addition of IV Venofer  at this point.

## 2024-06-11 ENCOUNTER — Telehealth: Payer: Self-pay | Admitting: Oncology

## 2024-06-11 ENCOUNTER — Inpatient Hospital Stay

## 2024-06-11 NOTE — Telephone Encounter (Signed)
 Ok to switch to virtual (at the end of the day please). Still needs labs prior.

## 2024-06-11 NOTE — Telephone Encounter (Signed)
 Patient called to reschedule appointments. She did not get labs today. She is requesting to be able to come have labs and then have a virtual visit. Please advise.  Thank you

## 2024-06-14 ENCOUNTER — Inpatient Hospital Stay

## 2024-06-14 ENCOUNTER — Inpatient Hospital Stay: Admitting: Oncology

## 2024-06-15 ENCOUNTER — Inpatient Hospital Stay

## 2024-06-15 ENCOUNTER — Telehealth: Payer: Self-pay | Admitting: Oncology

## 2024-06-15 NOTE — Telephone Encounter (Signed)
 Pt r/s lab

## 2024-06-15 NOTE — Telephone Encounter (Signed)
 Pt r/s lab from today due to transportation running late. I spoke with pt and lab is r/s to next week. Mychart visit stayed the same and we can r/s that if not all of the labs come back prior to that visit.

## 2024-06-20 ENCOUNTER — Other Ambulatory Visit: Payer: Self-pay

## 2024-06-20 ENCOUNTER — Emergency Department (HOSPITAL_COMMUNITY)
Admission: EM | Admit: 2024-06-20 | Discharge: 2024-06-20 | Disposition: A | Discharge status: Home / Self Care | Attending: Emergency Medicine | Admitting: Emergency Medicine

## 2024-06-20 ENCOUNTER — Emergency Department (HOSPITAL_COMMUNITY)

## 2024-06-20 ENCOUNTER — Encounter (HOSPITAL_COMMUNITY): Payer: Self-pay

## 2024-06-20 ENCOUNTER — Inpatient Hospital Stay: Attending: Oncology

## 2024-06-20 DIAGNOSIS — D509 Iron deficiency anemia, unspecified: Secondary | ICD-10-CM | POA: Insufficient documentation

## 2024-06-20 DIAGNOSIS — I10 Essential (primary) hypertension: Secondary | ICD-10-CM | POA: Insufficient documentation

## 2024-06-20 DIAGNOSIS — Z59868 Other specified financial insecurity: Secondary | ICD-10-CM | POA: Insufficient documentation

## 2024-06-20 DIAGNOSIS — R5383 Other fatigue: Secondary | ICD-10-CM | POA: Insufficient documentation

## 2024-06-20 DIAGNOSIS — Z79899 Other long term (current) drug therapy: Secondary | ICD-10-CM | POA: Insufficient documentation

## 2024-06-20 DIAGNOSIS — Z8249 Family history of ischemic heart disease and other diseases of the circulatory system: Secondary | ICD-10-CM | POA: Insufficient documentation

## 2024-06-20 DIAGNOSIS — R0789 Other chest pain: Secondary | ICD-10-CM | POA: Insufficient documentation

## 2024-06-20 DIAGNOSIS — Z800 Family history of malignant neoplasm of digestive organs: Secondary | ICD-10-CM | POA: Insufficient documentation

## 2024-06-20 DIAGNOSIS — M5412 Radiculopathy, cervical region: Secondary | ICD-10-CM | POA: Insufficient documentation

## 2024-06-20 DIAGNOSIS — Z90721 Acquired absence of ovaries, unilateral: Secondary | ICD-10-CM | POA: Insufficient documentation

## 2024-06-20 DIAGNOSIS — Z833 Family history of diabetes mellitus: Secondary | ICD-10-CM | POA: Insufficient documentation

## 2024-06-20 DIAGNOSIS — D500 Iron deficiency anemia secondary to blood loss (chronic): Secondary | ICD-10-CM

## 2024-06-20 DIAGNOSIS — Z8042 Family history of malignant neoplasm of prostate: Secondary | ICD-10-CM | POA: Insufficient documentation

## 2024-06-20 DIAGNOSIS — Z809 Family history of malignant neoplasm, unspecified: Secondary | ICD-10-CM | POA: Insufficient documentation

## 2024-06-20 DIAGNOSIS — Z803 Family history of malignant neoplasm of breast: Secondary | ICD-10-CM | POA: Insufficient documentation

## 2024-06-20 LAB — CBC WITH DIFFERENTIAL/PLATELET
Abs Immature Granulocytes: 0.02 K/uL (ref 0.00–0.07)
Basophils Absolute: 0 K/uL (ref 0.0–0.1)
Basophils Relative: 1 %
Eosinophils Absolute: 0.2 K/uL (ref 0.0–0.5)
Eosinophils Relative: 3 %
HCT: 31.5 % — ABNORMAL LOW (ref 36.0–46.0)
Hemoglobin: 9.6 g/dL — ABNORMAL LOW (ref 12.0–15.0)
Immature Granulocytes: 0 %
Lymphocytes Relative: 20 %
Lymphs Abs: 1.3 K/uL (ref 0.7–4.0)
MCH: 20.8 pg — ABNORMAL LOW (ref 26.0–34.0)
MCHC: 30.5 g/dL (ref 30.0–36.0)
MCV: 68.3 fL — ABNORMAL LOW (ref 80.0–100.0)
Monocytes Absolute: 0.6 K/uL (ref 0.1–1.0)
Monocytes Relative: 8 %
Neutro Abs: 4.5 K/uL (ref 1.7–7.7)
Neutrophils Relative %: 68 %
Platelets: 443 K/uL — ABNORMAL HIGH (ref 150–400)
RBC: 4.61 MIL/uL (ref 3.87–5.11)
RDW: 19.9 % — ABNORMAL HIGH (ref 11.5–15.5)
Smear Review: NORMAL
WBC: 6.6 K/uL (ref 4.0–10.5)
nRBC: 0 % (ref 0.0–0.2)

## 2024-06-20 LAB — TROPONIN T, HIGH SENSITIVITY
Troponin T High Sensitivity: <15 ng/L (ref 0–19)
Troponin T High Sensitivity: <15 ng/L (ref 0–19)

## 2024-06-20 LAB — CBC WITH DIFFERENTIAL (CANCER CENTER ONLY)
Abs Immature Granulocytes: 0.04 K/uL (ref 0.00–0.07)
Basophils Absolute: 0.1 K/uL (ref 0.0–0.1)
Basophils Relative: 1 %
Eosinophils Absolute: 0.2 K/uL (ref 0.0–0.5)
Eosinophils Relative: 3 %
HCT: 30.5 % — ABNORMAL LOW (ref 36.0–46.0)
Hemoglobin: 9.5 g/dL — ABNORMAL LOW (ref 12.0–15.0)
Immature Granulocytes: 1 %
Lymphocytes Relative: 17 %
Lymphs Abs: 1.2 K/uL (ref 0.7–4.0)
MCH: 20.8 pg — ABNORMAL LOW (ref 26.0–34.0)
MCHC: 31.1 g/dL (ref 30.0–36.0)
MCV: 66.7 fL — ABNORMAL LOW (ref 80.0–100.0)
Monocytes Absolute: 0.7 K/uL (ref 0.1–1.0)
Monocytes Relative: 9 %
Neutro Abs: 4.9 K/uL (ref 1.7–7.7)
Neutrophils Relative %: 69 %
Platelet Count: 430 K/uL — ABNORMAL HIGH (ref 150–400)
RBC: 4.57 MIL/uL (ref 3.87–5.11)
RDW: 19.5 % — ABNORMAL HIGH (ref 11.5–15.5)
WBC Count: 7.1 K/uL (ref 4.0–10.5)
nRBC: 0 % (ref 0.0–0.2)

## 2024-06-20 LAB — COMPREHENSIVE METABOLIC PANEL WITH GFR
ALT: 14 U/L (ref 0–44)
AST: 17 U/L (ref 15–41)
Albumin: 4.4 g/dL (ref 3.5–5.0)
Alkaline Phosphatase: 75 U/L (ref 38–126)
Anion gap: 13 (ref 5–15)
BUN: 11 mg/dL (ref 6–20)
CO2: 21 mmol/L — ABNORMAL LOW (ref 22–32)
Calcium: 9.2 mg/dL (ref 8.9–10.3)
Chloride: 103 mmol/L (ref 98–111)
Creatinine, Ser: 0.75 mg/dL (ref 0.44–1.00)
GFR, Estimated: >60 mL/min (ref >60)
Glucose, Bld: 111 mg/dL — ABNORMAL HIGH (ref 70–99)
Potassium: 4 mmol/L (ref 3.5–5.1)
Sodium: 138 mmol/L (ref 135–145)
Total Bilirubin: 0.3 mg/dL (ref 0.0–1.2)
Total Protein: 8.1 g/dL (ref 6.5–8.1)

## 2024-06-20 LAB — IRON AND TIBC
Iron: 18 ug/dL — ABNORMAL LOW (ref 28–170)
Saturation Ratios: 4 % — ABNORMAL LOW (ref 10.4–31.8)
TIBC: 462 ug/dL — ABNORMAL HIGH (ref 250–450)
UIBC: 444 ug/dL

## 2024-06-20 LAB — FERRITIN: Ferritin: 9 ng/mL — ABNORMAL LOW (ref 11–307)

## 2024-06-20 NOTE — ED Triage Notes (Signed)
 Pt arrived via POV c/o chest pain since Monday. Pt denies injury. Pt reports pain radiates to her neck as well.

## 2024-06-20 NOTE — ED Provider Notes (Signed)
 [place] EMERGENCY DEPARTMENT AT [facility] Provider Note   CSN: [id number] Arrival date & time: 06/20/24  9074     Patient presents with: Chest Pain   Kim Duran is a 38 y.o. female. With a hx of HTN presents today for left-sided chest pain that started 2 days ago.  She describes it as tingling that radiates down her arm and up her neck.  Patient states it is intermittent and rates a 2/10 at this time. Does not get better or worse with movement. Denies similar symptoms in the past.  No history of MI.  Denies recent long travel.  History of C-sections.  Denies family history of MI or other cardiac pathology.  No hx of GERD. Does not hurt to take a deep breath. Denies fevers shortness of breath cough congestion nausea vomiting diarrhea hemoptysis, diaphoresis.       Chest Pain      Prior to Admission medications   Medication Sig Start Date End Date Taking? Authorizing Provider  amLODipine  (NORVASC ) 10 MG tablet Take 1 tablet (10 mg total) by mouth daily. 01/04/24   Yolande Lamar BROCKS, MD  citalopram  (CELEXA ) 20 MG tablet Take 1 tablet (20 mg total) by mouth daily. 03/31/23   Kizzie Suzen SAUNDERS, CNM  Cyanocobalamin  (VITAMIN B-12 PO) Take by mouth.    [provider]  ibuprofen  (ADVIL ) 800 MG tablet Take 1 tablet (800 mg total) by mouth every 8 (eight) hours as needed. Patient not taking: Reported on 02/10/2024 08/31/22   Towana Ozell BROCKS, MD  methocarbamol  (ROBAXIN ) 500 MG tablet Take 1 tablet (500 mg total) by mouth 2 (two) times daily as needed for muscle spasms. Patient not taking: Reported on 02/10/2024 08/31/22   Towana Ozell BROCKS, MD  metoCLOPramide  (REGLAN ) 10 MG tablet Take 1 tablet (10 mg total) by mouth every 6 (six) hours. Patient not taking: Reported on 02/10/2024 01/04/24   Yolande Lamar BROCKS, MD  Multiple Vitamin (MULTIVITAMIN) tablet Take 1 tablet by mouth daily.    [provider]    Allergies: Patient has no known allergies.    Review  of Systems  Cardiovascular:  Positive for chest pain.    Updated Vital Signs BP 137/85   Pulse 88   Temp 98.1 F (36.7 C) (Oral)   Resp 18   Ht 5' 4 (1.626 m)   Wt 123.5 kg   LMP 06/19/2024 (Exact Date)   SpO2 100%   BMI 46.73 kg/m   Physical Exam Vitals and nursing note reviewed.  Constitutional:      General: She is not in acute distress.    Appearance: She is well-developed. She is not ill-appearing, toxic-appearing or diaphoretic.  HENT:     Head: Normocephalic and atraumatic.  Eyes:     Conjunctiva/sclera: Conjunctivae normal.  Cardiovascular:     Rate and Rhythm: Normal rate and regular rhythm.     Heart sounds: Normal heart sounds. No murmur heard. Pulmonary:     Effort: Pulmonary effort is normal. No respiratory distress.     Breath sounds: Normal breath sounds. No decreased breath sounds, wheezing, rhonchi or rales.  Chest:     Chest wall: No mass, deformity or swelling.  Abdominal:     Palpations: Abdomen is soft.     Tenderness: There is no abdominal tenderness.  Musculoskeletal:        General: No swelling.     Cervical back: Normal range of motion and neck supple. No pain with movement. Normal range of  motion.  Lymphadenopathy:     Cervical: No cervical adenopathy.  Skin:    General: Skin is warm and dry.     Capillary Refill: Capillary refill takes less than 2 seconds.  Neurological:     Mental Status: She is alert and oriented to person, place, and time. Mental status is at baseline.     Cranial Nerves: Cranial nerves 2-12 are intact.     Sensory: Sensation is intact. No sensory deficit.     Motor: No weakness.     Deep Tendon Reflexes: Reflexes are normal and symmetric.  Psychiatric:        Mood and Affect: Mood normal.     (all labs ordered are listed, but only abnormal results are displayed) Labs Reviewed  CBC WITH DIFFERENTIAL/PLATELET - Abnormal; Notable for the following components:      Result Value   Hemoglobin 9.6 (*)    HCT 31.5  (*)    MCV 68.3 (*)    MCH 20.8 (*)    RDW 19.9 (*)    Platelets 443 (*)    All other components within normal limits  COMPREHENSIVE METABOLIC PANEL WITH GFR - Abnormal; Notable for the following components:   CO2 21 (*)    Glucose, Bld 111 (*)    All other components within normal limits  TROPONIN T, HIGH SENSITIVITY  TROPONIN T, HIGH SENSITIVITY    EKG: EKG Interpretation Date/Time:  Wednesday June 20 2024 09:58:17 EST Ventricular Rate:  77 PR Interval:  156 QRS Duration:  88 QT Interval:  400 QTC Calculation: 452 R Axis:   77  Text Interpretation: Normal sinus rhythm with sinus arrhythmia Normal ECG When compared with ECG of 29-Jun-2015 21:05, No significant change was found Confirmed by Cleotilde Rogue ([id number]) on 06/20/2024 12:29:42 PM  Radiology: DG Chest 2 View Result Date: 06/20/2024 CLINICAL DATA:  Chest pain. EXAM: CHEST - 2 VIEW COMPARISON:  06/30/2015. FINDINGS: Heart size is upper limits of normal. Mediastinal contours are within normal limits. Both lungs are clear. No pleural effusion or pneumothorax. The visualized skeletal structures are unremarkable. IMPRESSION: No acute cardiopulmonary findings. Electronically Signed   By: Harrietta Sherry M.D.   On: 06/20/2024 10:35     Procedures   Medications Ordered in the ED - No data to display                                  Medical Decision Making Amount and/or Complexity of Data Reviewed Labs: ordered. Radiology: ordered.     This patient presents to the ED for concern of left-sided chest pain radiating to arm and neck, this involves an extensive number of treatment options, and is a complaint that carries with it a high risk of complications and morbidity.  The differential diagnosis includes cervical radiculopathy, ACS, pericarditis, myocarditis, aortic dissection, PE, pneumothorax, esophageal rupture, pneumonia, reflux/PUD, biliary disease, pancreatitis, costochondritis, anxiety.  Low risk and benign  physical exam as well as negative labs and chest x-ray negative.  Most likely cervical radiculopathy and not a cardiac pathology.    Co morbidities / Chronic conditions that complicate the patient evaluation  HTN   Lab Tests:  I Ordered, and personally interpreted labs.  The pertinent results include:  low hemoglobin    Imaging Studies ordered:  I ordered imaging studies including chest xray    I independently visualized and interpreted imaging which was negative.  I agree with the radiologist  interpretation   Cardiac Monitoring: / EKG:  EKG: normal sinus rhythm    Problem List / ED Course / Critical interventions / Medication management  Radiculopathy, iron  deficiency anemia, chest wall pain  Based on review of vitals, medical screening exam, lab work and/or imaging, there does not appear to be an acute, emergent etiology for the patient's symptoms. Counseled pt on good return precautions and encouraged both PCP and ED follow-up as needed.   Prior to discharge, I also discussed incidental imaging findings and lab results with patient in detail and advised appropriate, recommended follow-up in detail.  Recommended the patient to take NSAIDs and apply heat to the area.  Recommended the patient to follow-up with PCP for IDA. patient is in agreement with plan and is stable for discharge.         Final diagnoses:  Cervical radiculopathy  Chest wall pain  Iron  deficiency anemia, unspecified iron  deficiency anemia type    ED Discharge Orders     None          Braxton Dubois, PA-C 06/20/24 1504    Cleotilde Rogue, MD 06/20/24 1730

## 2024-06-20 NOTE — Discharge Instructions (Addendum)
 Take Tylenol  or ibuprofen  as needed for pain. Follow up with primary care doctor for evaluation of IDA.

## 2024-06-21 ENCOUNTER — Inpatient Hospital Stay: Admitting: Oncology

## 2024-06-21 ENCOUNTER — Encounter: Payer: Self-pay | Admitting: Oncology

## 2024-06-21 DIAGNOSIS — D500 Iron deficiency anemia secondary to blood loss (chronic): Secondary | ICD-10-CM

## 2024-06-21 NOTE — Progress Notes (Addendum)
 HEMATOLOGY-ONCOLOGY TeleHEALTH VISIT PROGRESS NOTE  I connected with Kim Duran on 06/21/2024  at  3:30 PM EST by video enabled telemedicine visit and verified that I am speaking with the correct person using two identifiers. I discussed the limitations, risks, security and privacy concerns of performing an evaluation and management service by telemedicine and the availability of in-person appointments. The patient expressed understanding and agreed to proceed.   Other persons participating in the visit and their role in the encounter:  None  Patient's location: home Provider's location: office Chief Complaint: Iron  deficiency anemia   INTERVAL HISTORY Kim Duran is a 38 y.o. female who has above history reviewed by me today presents for follow up visit for management of iron  deficiency anemia Patient reports feeling tired.  ER visit yesterday- left-sided chest pain, EKG negative, troponin negative  Review of Systems  Constitutional:  Positive for fatigue. Negative for appetite change, chills and fever.  HENT:   Negative for hearing loss and voice change.   Eyes:  Negative for eye problems.  Respiratory:  Negative for chest tightness and cough.   Cardiovascular:  Negative for chest pain.  Gastrointestinal:  Negative for abdominal distention, abdominal pain and blood in stool.  Endocrine: Negative for hot flashes.  Genitourinary:  Negative for difficulty urinating and frequency.   Musculoskeletal:  Negative for arthralgias.  Skin:  Negative for itching and rash.  Neurological:  Negative for extremity weakness.  Hematological:  Negative for adenopathy.  Psychiatric/Behavioral:  Negative for confusion.     Past Medical History:  Diagnosis Date   Anemia    Hypertension    Medical history non-contributory    Past Surgical History:  Procedure Laterality Date   CESAREAN SECTION     CESAREAN SECTION N/A 01/23/2018   Procedure: CESAREAN SECTION;  Surgeon: Nicholaus Burnard HERO, MD;  Location: [facility] BIRTHING SUITES;  Service: Obstetrics;  Laterality: N/A;   left ovary and tube removed  08/16/2009   10lb tumor wrapped around ovary     Family History  Problem Relation Age of Onset   Prostate cancer Paternal Grandfather        metastatic   Pancreatic cancer Maternal Grandmother 38   Prostate cancer Maternal Grandfather        dx 88s   Cancer Father    Hypertension Father    Hypertension Mother    Diabetes Mother        pre-diabetes   BRCA 1/2 Sister    Premature birth Daughter        born at 70 weeks   Breast cancer Maternal Aunt 47   Breast cancer Maternal Aunt 69   Diabetes Paternal Aunt    Prostate cancer Paternal Uncle        all 6 uncles with prostate cancer    Social History   Socioeconomic History   Marital status: Married    Spouse name: Not on file   Number of children: 5   Years of education: Not on file   Highest education level: Not on file  Occupational History   Not on file  Tobacco Use   Smoking status: Never    Passive exposure: Never   Smokeless tobacco: Never  Vaping Use   Vaping status: Never Used  Substance and Sexual Activity   Alcohol use: No   Drug use: No   Sexual activity: Yes    Birth control/protection: None  Other Topics Concern   Not on file  Social History Narrative   Not on  file   Social Drivers of Health   Tobacco Use: Low Risk (06/21/2024)   Patient History    Smoking Tobacco Use: Never    Smokeless Tobacco Use: Never    Passive Exposure: Never  Financial Resource Strain: Medium Risk (09/22/2021)   Overall Financial Resource Strain (CARDIA)    Difficulty of Paying Living Expenses: Somewhat hard  Food Insecurity: Food Insecurity Present (09/22/2021)   Hunger Vital Sign    Worried About Running Out of Food in the Last Year: Sometimes true    Ran Out of Food in the Last Year: Sometimes true  Transportation Needs: No Transportation Needs (09/22/2021)   PRAPARE - Scientist, Research (physical Sciences) (Medical): No    Lack of Transportation (Non-Medical): No  Physical Activity: Insufficiently Active (09/22/2021)   Exercise Vital Sign    Days of Exercise per Week: 2 days    Minutes of Exercise per Session: 30 min  Stress: Stress Concern Present (09/22/2021)   [employer] of Occupational Health - Occupational Stress Questionnaire    Feeling of Stress : Very much  Social Connections: Socially Integrated (09/22/2021)   Social Connection and Isolation Panel    Frequency of Communication with Friends and Family: More than three times a week    Frequency of Social Gatherings with Friends and Family: Never    Attends Religious Services: More than 4 times per year    Active Member of [employer] or Organizations: Yes    Attends Banker Meetings: Never    Marital Status: Married  Catering Manager Violence: Not At Risk (09/22/2021)   Humiliation, Afraid, Rape, and Kick questionnaire    Fear of Current or Ex-Partner: No    Emotionally Abused: No    Physically Abused: No    Sexually Abused: No  Depression (PHQ2-9): High Risk (03/31/2023)   Depression (PHQ2-9)    PHQ-2 Score: 16  Alcohol Screen: Low Risk (09/22/2021)   Alcohol Screen    Last Alcohol Screening Score (AUDIT): 0  Housing: Low Risk (09/22/2021)   Housing    Last Housing Risk Score: 0  Utilities: Not on file  Health Literacy: Not on file    Current Outpatient Medications on File Prior to Visit  Medication Sig Dispense Refill   amLODipine  (NORVASC ) 10 MG tablet Take 1 tablet (10 mg total) by mouth daily. 90 tablet 3   citalopram  (CELEXA ) 20 MG tablet Take 1 tablet (20 mg total) by mouth daily. 90 tablet 3   Cyanocobalamin  (VITAMIN B-12 PO) Take by mouth.     ibuprofen  (ADVIL ) 800 MG tablet Take 1 tablet (800 mg total) by mouth every 8 (eight) hours as needed. 21 tablet 0   Multiple Vitamin (MULTIVITAMIN) tablet Take 1 tablet by mouth daily.     methocarbamol  (ROBAXIN ) 500 MG tablet Take 1 tablet (500 mg  total) by mouth 2 (two) times daily as needed for muscle spasms. (Patient not taking: Reported on 06/21/2024) 20 tablet 0   metoCLOPramide  (REGLAN ) 10 MG tablet Take 1 tablet (10 mg total) by mouth every 6 (six) hours. (Patient not taking: Reported on 06/21/2024) 15 tablet 0   No current facility-administered medications on file prior to visit.    No Known Allergies     Observations/Objective: There were no vitals filed for this visit. There is no height or weight on file to calculate BMI.  Physical Exam Neurological:     Mental Status: She is alert.     Labs    Latest Ref  Rng & Units 06/20/2024    4:08 PM 06/20/2024   10:25 AM 02/10/2024    9:18 AM  CBC  WBC 4.0 - 10.5 K/uL 7.1  6.6  6.9   Hemoglobin 12.0 - 15.0 g/dL 9.5  9.6  87.9   Hematocrit 36.0 - 46.0 % 30.5  31.5  36.9   Platelets 150 - 400 K/uL 430  443  353    Lab Results  Component Value Date   HGB 9.5 (L) 06/20/2024   TIBC 462 (H) 06/20/2024   IRONPCTSAT 4 (L) 06/20/2024   FERRITIN 9 (L) 06/20/2024      ASSESSMENT & PLAN:   IDA (iron  deficiency anemia) Labs reviewed and discussed with patient. Hemoglobin and iron  panel have improved.   Lab Results  Component Value Date   HGB 9.5 (L) 06/20/2024   TIBC 462 (H) 06/20/2024   IRONPCTSAT 4 (L) 06/20/2024   FERRITIN 9 (L) 06/20/2024      Decreased hemoglobin and iron  store. Recommend IV venofer  weekly x 4 Patient declined pregnancy testing.   Orders Placed This Encounter  Procedures   Ferritin    Standing Status:   Future    Expected Date:   10/20/2024    Expiration Date:   01/18/2025   Iron  and TIBC    Standing Status:   Future    Expected Date:   10/20/2024    Expiration Date:   01/18/2025   CBC (Cancer Center Only)    Standing Status:   Future    Expected Date:   10/20/2024    Expiration Date:   01/18/2025   I provided 15 minutes of visit time during this encounter which included chart review, counseling, and coordination of care as documented above.   I discussed the assessment and treatment plan with the patient. The patient was provided an opportunity to ask questions and all were answered. The patient agreed with the plan and demonstrated an understanding of the instructions.  The patient was advised to call back or seek an in-person evaluation if the symptoms worsen or if the condition fails to improve as anticipated.   Follow-up in 4 months. Zelphia Cap, MD 06/21/2024 10:21 PM

## 2024-06-21 NOTE — Progress Notes (Signed)
 Pt here for follow up. Pt reports that she was at ER yesterday due to chest pain.

## 2024-06-21 NOTE — Assessment & Plan Note (Signed)
 Labs reviewed and discussed with patient. Hemoglobin and iron  panel have improved.   Lab Results  Component Value Date   HGB 9.5 (L) 06/20/2024   TIBC 462 (H) 06/20/2024   IRONPCTSAT 4 (L) 06/20/2024   FERRITIN 9 (L) 06/20/2024      Decreased hemoglobin and iron  store. Recommend IV venofer  weekly x 4

## 2024-07-20 ENCOUNTER — Inpatient Hospital Stay: Attending: Oncology

## 2024-07-20 VITALS — BP 132/92 | HR 90 | Temp 97.8°F | Resp 18

## 2024-07-20 DIAGNOSIS — D500 Iron deficiency anemia secondary to blood loss (chronic): Secondary | ICD-10-CM

## 2024-07-20 MED ORDER — IRON SUCROSE 20 MG/ML IV SOLN
200.0000 mg | Freq: Once | INTRAVENOUS | Status: AC
  Administered 2024-07-20: 200 mg via INTRAVENOUS
  Filled 2024-07-20: qty 10

## 2024-07-20 NOTE — Progress Notes (Signed)
 Ok to give venofer  without Preg test per Dr Babara

## 2024-07-27 ENCOUNTER — Other Ambulatory Visit: Payer: Self-pay | Admitting: Oncology

## 2024-07-27 ENCOUNTER — Inpatient Hospital Stay

## 2024-07-27 VITALS — BP 143/84 | HR 73 | Temp 97.0°F | Resp 18

## 2024-07-27 DIAGNOSIS — D500 Iron deficiency anemia secondary to blood loss (chronic): Secondary | ICD-10-CM

## 2024-07-27 MED ORDER — IRON SUCROSE 20 MG/ML IV SOLN
200.0000 mg | Freq: Once | INTRAVENOUS | Status: AC
  Administered 2024-07-27: 200 mg via INTRAVENOUS

## 2024-07-27 NOTE — Progress Notes (Signed)
 Last Menstrual cycle 07/20/23 (1st day), pt denies any chances of pregnancy. Per Dr. Jacobo okay to proceed with Venofer  without pregnancy test.

## 2024-08-03 ENCOUNTER — Inpatient Hospital Stay

## 2024-08-10 ENCOUNTER — Inpatient Hospital Stay

## 2024-08-10 NOTE — Progress Notes (Signed)
 Patient here for iv venofer  today. BP is elevated and patient states that she is very stressed and has not been taking her BP medication as prescribed.  No iron  given today. Patient requested to rescheduled today's appointment for another day.

## 2024-08-12 ENCOUNTER — Encounter: Payer: Self-pay | Admitting: Oncology

## 2024-08-16 ENCOUNTER — Ambulatory Visit: Admitting: Women's Health

## 2024-08-17 ENCOUNTER — Inpatient Hospital Stay: Attending: Oncology

## 2024-08-17 VITALS — BP 135/90 | HR 86 | Temp 97.5°F | Resp 18

## 2024-08-17 DIAGNOSIS — D500 Iron deficiency anemia secondary to blood loss (chronic): Secondary | ICD-10-CM

## 2024-08-17 MED ORDER — IRON SUCROSE 20 MG/ML IV SOLN
200.0000 mg | Freq: Once | INTRAVENOUS | Status: AC
  Administered 2024-08-17: 200 mg via INTRAVENOUS

## 2024-08-24 ENCOUNTER — Inpatient Hospital Stay

## 2024-08-29 ENCOUNTER — Ambulatory Visit: Admitting: Women's Health

## 2024-10-19 ENCOUNTER — Inpatient Hospital Stay

## 2024-10-24 ENCOUNTER — Inpatient Hospital Stay: Admitting: Oncology

## 2024-10-24 ENCOUNTER — Inpatient Hospital Stay
